# Patient Record
Sex: Female | Born: 1935 | Race: White | Hispanic: No | Marital: Married | Smoking: Never smoker
Health system: Southern US, Community
[De-identification: ages and names within clinical notes are randomized; demographics above are authoritative.]

## PROBLEM LIST (undated history)

## (undated) DIAGNOSIS — H409 Unspecified glaucoma: Secondary | ICD-10-CM

## (undated) DIAGNOSIS — C801 Malignant (primary) neoplasm, unspecified: Secondary | ICD-10-CM

## (undated) DIAGNOSIS — IMO0001 Reserved for inherently not codable concepts without codable children: Secondary | ICD-10-CM

## (undated) DIAGNOSIS — Z923 Personal history of irradiation: Secondary | ICD-10-CM

## (undated) DIAGNOSIS — IMO0002 Reserved for concepts with insufficient information to code with codable children: Secondary | ICD-10-CM

## (undated) HISTORY — DX: Reserved for concepts with insufficient information to code with codable children: IMO0002

## (undated) HISTORY — DX: Personal history of irradiation: Z92.3

## (undated) HISTORY — DX: Reserved for inherently not codable concepts without codable children: IMO0001

## (undated) HISTORY — PX: CHOLECYSTECTOMY: SHX55

## (undated) HISTORY — DX: Unspecified glaucoma: H40.9

## (undated) HISTORY — PX: APPENDECTOMY: SHX54

---

## 2004-06-12 ENCOUNTER — Emergency Department (HOSPITAL_COMMUNITY): Admission: EM | Admit: 2004-06-12 | Discharge: 2004-06-12 | Payer: Self-pay | Admitting: Family Medicine

## 2005-09-29 ENCOUNTER — Emergency Department (HOSPITAL_COMMUNITY): Admission: EM | Admit: 2005-09-29 | Discharge: 2005-09-29 | Payer: Self-pay | Admitting: Emergency Medicine

## 2006-12-12 ENCOUNTER — Inpatient Hospital Stay (HOSPITAL_COMMUNITY): Admission: EM | Admit: 2006-12-12 | Discharge: 2006-12-16 | Payer: Self-pay | Admitting: Emergency Medicine

## 2006-12-13 ENCOUNTER — Encounter (INDEPENDENT_AMBULATORY_CARE_PROVIDER_SITE_OTHER): Payer: Self-pay | Admitting: General Surgery

## 2010-06-25 NOTE — H&P (Signed)
Ebony Herring, CHAGNON                 ACCOUNT NO.:  [id number]   MEDICAL RECORD NO.:  [id number]          PATIENT TYPE:  EMS   LOCATION:  ED                           FACILITY:  [facility]   PHYSICIAN:  Adolph Pollack, M.D.DATE OF BIRTH:  06-05-1931   DATE OF ADMISSION:  12/12/2006  DATE OF DISCHARGE:                              HISTORY & PHYSICAL   CHIEF COMPLAINT:  Persistent right upper quadrant pain.   HISTORY OF PRESENT ILLNESS:  This 77 year old female has been having  intermittent bouts of self-limited right upper quadrant pressure-type  pains especially following a meal for about the past 5 weeks.  She awoke  this morning at 2 o'clock with persistent severe pain in the right upper  quadrant, pressure and sharp, that did not go away.  She eventually  presented to the emergency department at about 3:45 this afternoon and  was evaluated.  She was noted to have a leukocytosis and right upper  quadrant tenderness.  An ultrasound demonstrated a cholelithiasis with  gallbladder wall thickening consistent with acute cholecystitis and I  was asked to see her.  She denies fever, chills, nausea or vomiting.   PAST MEDICAL HISTORY:  Appendicitis.   PREVIOUS OPERATIONS:  Appendectomy.   ALLERGIES:  None.   MEDICATIONS:  None.   SOCIAL HISTORY:  She is married.  No tobacco or alcohol use.   PRIMARY CARE PHYSICIAN:  She does not have a primary care physician.   FAMILY HISTORY:  Notable for gallbladder disease.   REVIEW OF SYSTEMS:  CARDIOVASCULAR:  No heart disease or hypertension.  PULMONARY:  No pneumonia, emphysema or chronic lung disease.  GI:  No  hepatitis, diverticulitis or peptic ulcer disease.  GU:  No kidney  stones or urinary tract infections.  ENDOCRINE:  No diabetes, thyroid  disease or hypercholesterolemia.  NEUROLOGIC:  No strokes or seizures.  HEMATOLOGIC:  No bleeding disorder, blood transfusions or blood clots.   PHYSICAL EXAM:  GENERAL:  a well-developed,  well-nourished female who  appears to be in no acute distress, pleasant and cooperative.  VITAL SIGNS: Temperature is 98.5, blood pressure 123/75, pulse 73, O2  SATS 98% room air, and respiratory rate 18.  EYES:  Extraocular motions are intact.  No icterus.  NECK:  Supple without masses.  RESPIRATORY:  Breath sounds are equal and clear.  Respirations  unlabored.  CARDIOVASCULAR:  Regular rate.  Regular rhythm.  I do not hear a murmur.  ABDOMEN:  Soft with a right lower quadrant scar.  There is right upper  quadrant tenderness and guarding and a positive Murphy's sign.  SKIN:  No jaundice.  EXTREMITIES:  No edema.   LABORATORY DATA:  White count 12,000 with a hemoglobin of 13.6.  Liver  function tests and lipase are within normal limits.   Ultrasound demonstrates gallstones and gallbladder wall thickening.   IMPRESSION:  Acute cholecystitis.   PLAN:  Admit to the hospital and start IV antibiotics.   PLAN:  Laparoscopic or possible open cholecystectomy tomorrow.  I have  discussed the procedure, rationale and risks with her.  The risks  include, but not limited to bleeding, infection, wound-healing problems,  anesthesia, bile leak, accidental injury to the common bile  duct/liver/intestine and postcholecystectomy diarrhea.  Both she and her  husband seem to understand and agree to proceed.      Adolph Pollack, M.D.  Electronically Signed     TJR/MEDQ  D:  12/12/2006  T:  12/14/2006  Job:  [id number]

## 2010-06-25 NOTE — Op Note (Signed)
Ebony Herring, Ebony Herring                 ACCOUNT NO.:  [id number]   MEDICAL RECORD NO.:  [id number]          PATIENT TYPE:  INP   LOCATION:  1617                         FACILITY:  [facility]   PHYSICIAN:  Adolph Pollack, M.D.DATE OF BIRTH:  11/20/1931   DATE OF PROCEDURE:  12/13/2006  DATE OF DISCHARGE:                               OPERATIVE REPORT   PREOPERATIVE DIAGNOSIS:  Acute cholecystitis.   POSTOPERATIVE DIAGNOSIS:  Acute cholecystitis.   PROCEDURE:  Laparoscopic cholecystectomy with intraoperative  cholangiogram.   SURGEON:  Adolph Pollack, M.D.   ASSISTANT:  Velora Heckler, MD   ANESTHESIA:  General.   INDICATIONS:  This 77 year old female was admitted last night with acute  cholecystitis.  She has been given  intravenous hydration and  antibiotics and now presents for cholecystectomy.  The procedure and  risks were discussed with her preoperatively.   TECHNIQUE:  She is brought to the operating room, placed supine on the  operating table.  General anesthetic was administered.  The abdominal  wall was sterilely prepped, draped.  Marcaine was infiltrated in the  subumbilical region.  A small subumbilical incision was made through the  skin, subcutaneous tissue, fascia and peritoneum entering the peritoneal  cavity under direct vision.  A pursestring suture of 0-0 Vicryl was  placed around the fascial edges.  A Hassan trocar was introduced into  the peritoneal cavity.  Pneumoperitoneum created by insufflation of CO2  gas.   Next the laparoscope introduced.  She is then placed in reverse  Trendelenburg position right side tilted up.  An 11 mm trocar was placed  in the epigastric incision and two 5 mm trocars placed in right upper  quadrant.   Gallbladder was visualized, tense and acutely inflamed.  A needle  decompression was performed of the gallbladder and white bile was  evacuated.  Left lobe of the liver was flopping over the infundibular  area so I added an  extra 5 mm trocar in the epigastric region and  retracted this to the left.  The fundus of the gallbladder was grasped  and retracted to the right shoulder.  The infundibulum was grasped.  The  gallbladder was mainly intrahepatic, quite inflamed and indurated.  Using careful blunt dissection and hydrodissection I mobilized it into  the infundibulum.  I identified the cystic duct which appeared to be  dilated.  There appeared to be a stone close to the cystic duct common  bile duct junction.  I created a window around the cystic duct and very  carefully milked the stone back up toward the cystic duct gallbladder  junction.  The cystic duct was very friable and a little bit it tore in  the mid section.   A clip was placed on the gallbladder cystic duct junction in preparation  for cholangiogram.  Cholangiocatheter was then passed through the  anterior abdominal wall.  Small incision was made in the cystic duct.  The cholangiocath was placed into this and the duct was irrigated but it  was leaking out the tear in the midportion.  Thus I put the  catheter  through the small little tear in the midportion.  I then performed a  cholangiogram.   Under real time fluoroscopy dilute contrast was injected to the cystic  duct.  There was a short remaining length left.  The common hepatic and  common bile duct and right left hepatic ducts were visualized, contrast  drained into the duodenum without obvious evidence of obstruction.   The cholangiocath was then removed, I clipped the cystic duct three  times proximal to the small mid cystic duct tear and I divided it.  Before I had done this, I milked the stone out of the cystic duct.  I  then identified the anterior and posterior branch of the cystic artery.  Windows were created around these.  There were clipped and divided.  Then dissected the gallbladder free from liver.  Most of it intrahepatic  so there was a fair amount of oozing from liver bed.   Once the  gallbladder was free, I then placed it in an Endopouch bag.  I irrigated  out the gallbladder fossa and I controlled the bleeding with  electrocautery over the raw surface area.  I then noted no further  significant bleeding nor bile leak.  Surgicel was then placed in the  gallbladder fossa.  The irrigation fluid was then evacuated.   Then a size 19 Blake drain was then placed into the abdominal cavity and  positioned and in the gallbladder fossa.  It was brought out through the  lateral 5 mm port on the right side and anchored to the skin with 3-0  nylon suture.  The remaining trocars removed and pneumoperitoneum was  released.  The skin incisions were then closed with 4-0 Monocryl  subcuticular stitches followed by Steri-Strips and sterile dressings.   She tolerated the procedure without apparent complications and was taken  to recovery in satisfactory condition.      Adolph Pollack, M.D.  Electronically Signed     TJR/MEDQ  D:  12/13/2006  T:  12/14/2006  Job:  [id number]

## 2010-06-28 NOTE — Discharge Summary (Signed)
Ebony Herring, Ebony Herring                 ACCOUNT NO.:  [id number]   MEDICAL RECORD NO.:  [id number]          PATIENT TYPE:  INP   LOCATION:  1617                         FACILITY:  [facility]   PHYSICIAN:  Adolph Pollack, M.D.DATE OF BIRTH:  1931-11-01   DATE OF ADMISSION:  12/12/2006  DATE OF DISCHARGE:  12/16/2006                               DISCHARGE SUMMARY   FINAL DIAGNOSIS:  Acute cholecystitis.   SECONDARY DIAGNOSIS:  Mild postoperative ileus.   PROCEDURE:  Laparoscopic cholecystectomy with intraoperative  cholangiogram, December 13, 2006.   REASON FOR ADMISSION:  This is a 78 year old female who presented with  right upper quadrant pain that was persistent.  She went to the  emergency department and was noted have findings consistent with acute  cholecystitis and was admitted.   HOSPITAL COURSE:  She was started on intravenous antibiotics and taken  to the operating room on December 13, 2006, where she underwent the  laparoscopic cholecystectomy and tolerated this well.  She had some mild  postop ileus and was given a Dulcolax suppository and this helped the  ileus resolve.  She was tolerating diet, passing gas, walking and by  postop day #3, was ready to go home.   DISPOSITION:  Discharged to home on December 16, 2006 in satisfactory  condition.   DISCHARGE INSTRUCTIONS:  She was given typewritten home care  instructions as well as Vicodin for pain.   FOLLOWUP:  She did have a drain in which was just draining some small  amount of serosanguineous material, nonbilious.  She will come back to  the office Monday 11/10 to have the drain removed.      Adolph Pollack, M.D.  Electronically Signed     TJR/MEDQ  D:  01/14/2007  T:  01/14/2007  Job:  [id number]

## 2010-11-19 LAB — URINALYSIS, ROUTINE W REFLEX MICROSCOPIC
Glucose, UA: NEGATIVE
Urobilinogen, UA: 0.2
pH: 5.5

## 2010-11-19 LAB — CBC
HCT: 39.8
Hemoglobin: 13.6
MCHC: 34.1
RBC: 4.45
RDW: 13.4
WBC: 12 — ABNORMAL HIGH

## 2010-11-19 LAB — COMPREHENSIVE METABOLIC PANEL
ALT: 32
Albumin: 3.7
Alkaline Phosphatase: 75
GFR calc non Af Amer: >60
Glucose, Bld: 119 — ABNORMAL HIGH
Sodium: 142
Total Protein: 6.8

## 2010-11-19 LAB — DIFFERENTIAL
Basophils Absolute: 0.1
Eosinophils Absolute: 0.1
Eosinophils Relative: 0
Lymphocytes Relative: 24
Monocytes Absolute: 0.9 — ABNORMAL HIGH
Neutrophils Relative %: 67

## 2010-11-19 LAB — LIPASE, BLOOD: Lipase: 23

## 2011-04-10 ENCOUNTER — Other Ambulatory Visit: Payer: Self-pay

## 2011-04-10 ENCOUNTER — Emergency Department (HOSPITAL_COMMUNITY): Payer: Medicare Other

## 2011-04-10 ENCOUNTER — Emergency Department (HOSPITAL_COMMUNITY)
Admission: EM | Admit: 2011-04-10 | Discharge: 2011-04-10 | Disposition: A | Discharge status: Home / Self Care | Payer: Medicare Other | Attending: Emergency Medicine | Admitting: Emergency Medicine

## 2011-04-10 ENCOUNTER — Encounter (HOSPITAL_COMMUNITY): Payer: Self-pay | Admitting: Emergency Medicine

## 2011-04-10 DIAGNOSIS — R21 Rash and other nonspecific skin eruption: Secondary | ICD-10-CM | POA: Insufficient documentation

## 2011-04-10 DIAGNOSIS — Z7982 Long term (current) use of aspirin: Secondary | ICD-10-CM | POA: Insufficient documentation

## 2011-04-10 DIAGNOSIS — M549 Dorsalgia, unspecified: Secondary | ICD-10-CM

## 2011-04-10 MED ORDER — TRAMADOL HCL 50 MG PO TABS: 50.0000 mg | ORAL_TABLET | Freq: Four times a day (QID) | ORAL | Status: AC | PRN

## 2011-04-10 NOTE — ED Provider Notes (Signed)
History     CSN: [id number]  Arrival date & time 04/10/11  0950   First MD Initiated Contact with Patient 04/10/11 1136      Chief Complaint  Patient presents with  . Shoulder Pain    Burning Pain in "skin under both shoulder blades" x 48 hrs. No rash noted    (Consider location/radiation/quality/duration/timing/severity/associated sxs/prior treatment) HPI  A generally healthy 78 year old female presenting to the ED with chief complaints of upper back discomfort. Patient states for the past 2 days she has been experiencing an itching and burning sensation to her mid back. Sensation is on the skin and below both of her shoulder blades. Itching burning sensation is with the same intensity. Nothing makes it better or worse. She denies chest pain, shortness shortness of breath, throat swelling, trouble breathing, nausea, vomiting, diarrhea, or abdominal pain. She denies history of shingles. She denies any changes in her detergent or any new pets. She denies any medication changes. She denies fever, headache, or rash.  History reviewed. No pertinent past medical history.  Past Surgical History  Procedure Date  . Cholecystectomy   . Appendectomy     History reviewed. No pertinent family history.  History  Substance Use Topics  . Smoking status: Never Smoker   . Smokeless tobacco: Not on file  . Alcohol Use: No    OB History    Grav Para Term Preterm Abortions TAB SAB Ect Mult Living                  Review of Systems  All other systems reviewed and are negative.    Allergies  Review of patient's allergies indicates no known allergies.  Home Medications   Current Outpatient Rx  Name Route Sig Dispense Refill  . ASPIRIN 325 MG PO TABS Oral Take 325 mg by mouth daily.      BP 153/89  Pulse 64  Temp 98.3 F (36.8 C)  Resp 18  Wt 139 lb (63.05 kg)  SpO2 99%  Physical Exam  Nursing note and vitals reviewed. Constitutional: She appears well-developed and  well-nourished. No distress.       Awake, alert, nontoxic appearance  HENT:  Head: Atraumatic.  Eyes: Conjunctivae are normal. Right eye exhibits no discharge. Left eye exhibits no discharge.  Neck: Neck supple.  Cardiovascular: Normal rate and regular rhythm.   Pulmonary/Chest: Effort normal. No respiratory distress. She exhibits no tenderness.  Abdominal: Soft. There is no tenderness. There is no rebound.  Musculoskeletal: She exhibits no tenderness.       ROM appears intact, no obvious focal weakness  Neurological:       Mental status and motor strength appears intact  Skin: No rash noted.     Psychiatric: She has a normal mood and affect.    ED Course  Procedures (including critical care time)  Labs Reviewed - No data to display No results found.   No diagnosis found.   Date: 04/10/2011  Rate: 58  Rhythm: normal sinus rhythm  QRS Axis: normal  Intervals: normal  ST/T Wave abnormalities: normal  Conduction Disutrbances:none  Narrative Interpretation:   Old EKG Reviewed: none available  Dg Chest 2 View  04/10/2011  *RADIOLOGY REPORT*  Clinical Data: Pain  CHEST - 2 VIEW  Comparison: December 12, 2006  Findings: The cardiac silhouette, mediastinum, pulmonary vasculature are within normal limits.  Both lungs are clear. There is no acute bony abnormality.  IMPRESSION: There is no evidence of acute cardiac or pulmonary  process.  Original Report Authenticated By: Brandon Melnick, M.D.      MDM  Sxs suggestive of shingle prior to rash outbreak.  Due to her age, ECG and cxr obtain.  Discussed with my attending.     1:01 PM Xray reviewed by me and it was negative.  IN the setting of an unremarkable exam, normal ECG, and normal CXR, i recommend for pt to keep an eye out for possible rash.  If rash appears, to f/u with PCP for antiviral treatment. My attending has seen and evaluate the pt.        Fayrene Helper, PA-C 04/10/11 1320

## 2011-04-10 NOTE — ED Notes (Signed)
Pain in upper back. Concerned about itching, burning

## 2011-04-10 NOTE — ED Provider Notes (Signed)
Patient relates over the weekend she was cleaning house and was moving furniture around. She relates to 3 days ago she started getting pain in her back between her shoulder blades it hurts when she moves her arms. She also states it's a burning discomfort she also has itching.  Issues back examined there is no rash seen she is noted to have a lot of seborrheic keratosis but no rash c/w shingles, she is tender in the area between her shoulder blades and has pain on ROM of her arms.  Medical screening examination/treatment/procedure(s) were conducted as a shared visit with non-physician practitioner(s) and myself.  I personally evaluated the patient during the encounter Devoria Albe, MD, Franz Dell, MD 04/10/11 1330

## 2011-04-10 NOTE — ED Provider Notes (Signed)
See prior note   Ward Givens, MD 04/10/11 1701

## 2012-09-29 ENCOUNTER — Ambulatory Visit (INDEPENDENT_AMBULATORY_CARE_PROVIDER_SITE_OTHER): Payer: Medicare Other | Admitting: Family Medicine

## 2012-09-29 ENCOUNTER — Encounter: Payer: Self-pay | Admitting: Family Medicine

## 2012-09-29 VITALS — BP 140/78 | HR 66 | Temp 96.9°F | Ht 62.0 in | Wt 138.0 lb

## 2012-09-29 DIAGNOSIS — H6123 Impacted cerumen, bilateral: Secondary | ICD-10-CM

## 2012-09-29 DIAGNOSIS — R030 Elevated blood-pressure reading, without diagnosis of hypertension: Secondary | ICD-10-CM

## 2012-09-29 DIAGNOSIS — J019 Acute sinusitis, unspecified: Secondary | ICD-10-CM

## 2012-09-29 DIAGNOSIS — H6120 Impacted cerumen, unspecified ear: Secondary | ICD-10-CM | POA: Insufficient documentation

## 2012-09-29 DIAGNOSIS — IMO0001 Reserved for inherently not codable concepts without codable children: Secondary | ICD-10-CM

## 2012-09-29 MED ORDER — PENICILLIN V POTASSIUM 500 MG PO TABS: 500.0000 mg | ORAL_TABLET | Freq: Three times a day (TID) | ORAL | Status: DC

## 2012-09-29 NOTE — Assessment & Plan Note (Signed)
Ears flushed at bedside

## 2012-09-29 NOTE — Assessment & Plan Note (Signed)
Antibiotics Nasal saline Mucinex

## 2012-09-29 NOTE — Patient Instructions (Addendum)
Take antibiotics as prescribed Pick up Mucinex , no decongestants because of your blood pressure Plenty of fluids Nasal saline over the counter FU 4 weeks for repeat blood pressure

## 2012-09-29 NOTE — Progress Notes (Signed)
  Subjective:    Patient ID: Ebony Herring, female    DOB: April 09, 1931, 79 y.o.   MRN: [id number]  HPI Pt here to re-estabish care, Has not been seen in 5 years. She did have a cholecystectomy back in 2008. She is followed by her eye doctor. She states she's not sick she will not come in. She declines any immunizations. She's here today due to worsening sinus infection.started about 10 days ago, took tylenol for headache which helped some. Drainage and pressure in both sides or nose. Also has dripping down throat. Noticed ear pressure and pain, can not hear well out of either ear. No cough. No fever History reviewed  Eye doctor- Dr. Salvatore Marvel - [facility], has increased pressures in Eye but no diagnosis Glucoma  Review of Systems   GEN- denies fatigue, fever, weight loss,weakness, recent illness HEENT- denies eye drainage, change in vision,+ nasal discharge, CVS- denies chest pain, palpitations RESP- denies SOB, cough, wheeze ABD- denies N/V, change in stools, abd pain GU- denies dysuria, hematuria, dribbling, incontinence MSK- denies joint pain, muscle aches, injury Neuro- denies headache, dizziness, syncope, seizure activity      Objective:   Physical Exam  GEN- NAD, alert and oriented x3 HEENT- PERRL, EOMI, non injected sclera, pink conjunctiva, MMM, oropharynx clear ,Bilateral cerumen impaction, no erythema of canals + maxillary sinus tenderness, inflammed turbinates,  Clear Nasal drainage  Neck- Supple, no LAD CVS- RRR, no murmur RESP-CTAB EXT- No edema Pulses- Radial 2+  S/p irrigation, mild erythema right canal some hard wax on edges, TM clear no effusion, left canal clear TM clear no effusion  hearing improved and pain        Assessment & Plan:

## 2012-09-30 ENCOUNTER — Encounter: Payer: Self-pay | Admitting: Family Medicine

## 2012-09-30 NOTE — Assessment & Plan Note (Signed)
Repeat BP improved , will recheck in 4 weeks, when not in acute illness, no meds given today

## 2012-10-27 ENCOUNTER — Encounter: Payer: Self-pay | Admitting: Family Medicine

## 2012-10-27 ENCOUNTER — Ambulatory Visit (INDEPENDENT_AMBULATORY_CARE_PROVIDER_SITE_OTHER): Payer: Medicare Other | Admitting: Family Medicine

## 2012-10-27 VITALS — BP 138/70 | HR 60 | Temp 96.6°F | Resp 18 | Wt 138.0 lb

## 2012-10-27 DIAGNOSIS — N760 Acute vaginitis: Secondary | ICD-10-CM

## 2012-10-27 DIAGNOSIS — R030 Elevated blood-pressure reading, without diagnosis of hypertension: Secondary | ICD-10-CM

## 2012-10-27 DIAGNOSIS — IMO0001 Reserved for inherently not codable concepts without codable children: Secondary | ICD-10-CM

## 2012-10-27 DIAGNOSIS — R300 Dysuria: Secondary | ICD-10-CM

## 2012-10-27 LAB — LIPID PANEL
Cholesterol: 274 mg/dL — ABNORMAL HIGH (ref 0–200)
HDL: 57 mg/dL (ref >39)
LDL Cholesterol: 167 mg/dL — ABNORMAL HIGH (ref 0–99)
Total CHOL/HDL Ratio: 4.8 Ratio
Triglycerides: 252 mg/dL — ABNORMAL HIGH (ref <150)
VLDL: 50 mg/dL — ABNORMAL HIGH (ref 0–40)

## 2012-10-27 LAB — COMPREHENSIVE METABOLIC PANEL
AST: 20 U/L (ref 0–37)
Calcium: 9.9 mg/dL (ref 8.4–10.5)
Chloride: 103 mEq/L (ref 96–112)
Creat: 0.94 mg/dL (ref 0.50–1.10)
Total Protein: 6.9 g/dL (ref 6.0–8.3)

## 2012-10-27 LAB — CBC WITH DIFFERENTIAL/PLATELET
Basophils Relative: 1 % (ref 0–1)
Eosinophils Absolute: 0.2 10*3/uL (ref 0.0–0.7)
Eosinophils Relative: 3 % (ref 0–5)
HCT: 40.8 % (ref 36.0–46.0)
Hemoglobin: 13.7 g/dL (ref 12.0–15.0)
Lymphocytes Relative: 37 % (ref 12–46)
Monocytes Relative: 9 % (ref 3–12)
RBC: 4.62 MIL/uL (ref 3.87–5.11)
RDW: 14.2 % (ref 11.5–15.5)

## 2012-10-27 LAB — URINALYSIS, ROUTINE W REFLEX MICROSCOPIC
Glucose, UA: NEGATIVE mg/dL
Nitrite: NEGATIVE

## 2012-10-27 MED ORDER — FLUCONAZOLE 150 MG PO TABS: 150.0000 mg | ORAL_TABLET | Freq: Once | ORAL | Status: DC

## 2012-10-27 NOTE — Patient Instructions (Addendum)
Keep and eye on BP at pharmacy, come back if BP > 150/90 We will call with lab results Take the diflucan for the discharge  F/U as needed, recommend yearly visit

## 2012-10-27 NOTE — Progress Notes (Signed)
  Subjective:    Patient ID: Ebony Herring, female    DOB: 04/27/1931, 79 y.o.   MRN: [id number]  HPI  Pt here to f/u elevated BP, was seen 4 weeks ago, secondary to sinusitis, BP was elevated initially 160/100. Completed antibiotics and sinus symptoms have resolved. She did notice over past 2 weeks, urinary frequency and burning with urination. It has improved but she had a few days with white vaginal discharge. Denies vaginal pruritis or odor, denies vaginal bleeding.  Review of Systems   GEN- denies fatigue, fever, weight loss,weakness, recent illness HEENT- denies eye drainage, change in vision, nasal discharge, CVS- denies chest pain, palpitations RESP- denies SOB, cough, wheeze ABD- denies N/V, change in stools, abd pain GU- + dysuria, hematuria, dribbling, incontinence Neuro- denies headache, dizziness, syncope, seizure activity      Objective:   Physical Exam GEN- NAD, alert and oriented x3 HEENT- PERRL, EOMI, non injected sclera, pink conjunctiva, MMM, oropharynx clear Neck- Supple, no LAD CVS- RRR, no murmur RESP-CTAB ABD-NABS,soft, NT,ND, no CVA tenderness EXT- No edema Pulses- Radial 2+        Assessment & Plan:

## 2012-10-28 DIAGNOSIS — N760 Acute vaginitis: Secondary | ICD-10-CM | POA: Insufficient documentation

## 2012-10-28 DIAGNOSIS — R300 Dysuria: Secondary | ICD-10-CM | POA: Insufficient documentation

## 2012-10-28 NOTE — Assessment & Plan Note (Signed)
Will treat empirically for yeast with diflucan with recent course of antibiotics

## 2012-10-28 NOTE — Assessment & Plan Note (Signed)
Much improved today, we will monitor, pt did agree to get labs done today, has not been seen > 5 years

## 2012-10-28 NOTE — Assessment & Plan Note (Signed)
Symptoms have improved, will await urine culture before giving any further antibiotics

## 2012-10-29 LAB — URINALYSIS, MICROSCOPIC ONLY: Crystals: NONE SEEN

## 2012-11-30 ENCOUNTER — Ambulatory Visit (INDEPENDENT_AMBULATORY_CARE_PROVIDER_SITE_OTHER): Payer: Medicare Other | Admitting: Family Medicine

## 2012-11-30 ENCOUNTER — Encounter: Payer: Self-pay | Admitting: Family Medicine

## 2012-11-30 VITALS — BP 100/60 | HR 80 | Temp 97.3°F | Resp 20 | Wt 139.5 lb

## 2012-11-30 DIAGNOSIS — I493 Ventricular premature depolarization: Secondary | ICD-10-CM

## 2012-11-30 DIAGNOSIS — I4949 Other premature depolarization: Secondary | ICD-10-CM

## 2012-11-30 DIAGNOSIS — N898 Other specified noninflammatory disorders of vagina: Secondary | ICD-10-CM

## 2012-11-30 DIAGNOSIS — N760 Acute vaginitis: Secondary | ICD-10-CM

## 2012-11-30 DIAGNOSIS — N390 Urinary tract infection, site not specified: Secondary | ICD-10-CM

## 2012-11-30 LAB — URINALYSIS, ROUTINE W REFLEX MICROSCOPIC
Ketones, ur: NEGATIVE mg/dL
Nitrite: NEGATIVE
Protein, ur: NEGATIVE mg/dL
Urobilinogen, UA: 0.2 mg/dL (ref 0.0–1.0)

## 2012-11-30 LAB — URINALYSIS, MICROSCOPIC ONLY

## 2012-11-30 MED ORDER — FLUCONAZOLE 150 MG PO TABS: 150.0000 mg | ORAL_TABLET | Freq: Once | ORAL | Status: DC

## 2012-11-30 MED ORDER — CEPHALEXIN 500 MG PO CAPS: 500.0000 mg | ORAL_CAPSULE | Freq: Two times a day (BID) | ORAL | Status: DC

## 2012-11-30 NOTE — Progress Notes (Signed)
  Subjective:    Patient ID: Ebony Herring, female    DOB: 12/21/1931, 80 y.o.   MRN: [id number]  HPI  Patient here with urinary frequency and leaking for the past week. She's also had some vaginal discharge associated. On Friday she had spotting on 2 occasions therefore this may her concern. She denies any abdominal pain or fever. At our last visit she was treated for vaginitis with Diflucan she had been on a course of penicillin and all for symptoms resolved at that time. She had some mild dysuria around that time as well but the culture was negative. She now returns with similar symptoms.  Review of Systems - per above  GEN- denies fatigue, fever, weight loss,weakness, recent illness HEENT- denies eye drainage, change in vision, nasal discharge, CVS- denies chest pain, palpitations RESP- denies SOB, cough, wheeze ABD- denies N/V, change in stools, abd pain GU- denies dysuria, hematuria, dribbling,+ incontinence MSK- denies joint pain, muscle aches, injury Neuro- denies headache, dizziness, syncope, seizure activity       Objective:   Physical Exam  GEN- NAD, alert and oriented x 3 CVS- Regular rate, irregular rhythms, no murmur RESP-CTAB ABD-NABS,soft,NT,ND GU- normal external genitalia, vaginal mucosa pink/vaginal atrophy, cervix visualized no growth, no blood form os, minimal thin clear discharge, no CMT, no ovarian masses, uterus palpated no mass felt, mild bladder prolapse Ext- no edema Pulse 2+       Assessment & Plan:

## 2012-11-30 NOTE — Assessment & Plan Note (Signed)
Asymptomatic PVCs noted nothing needs to be done at this time.

## 2012-11-30 NOTE — Assessment & Plan Note (Signed)
Overall unremarkable. No bleeding seen on exam today. We'll treat the urine per above

## 2012-11-30 NOTE — Assessment & Plan Note (Signed)
Will treat with Keflex twice a day will have her followup with the Diflucan at the end of antibiotic If her symptoms do not improve we may need urology involvement for her leaking

## 2012-11-30 NOTE — Patient Instructions (Addendum)
Take the antibiotics as prescribed Drink fluids F/U as needed or if symptoms do not improve

## 2012-12-20 ENCOUNTER — Encounter: Payer: Self-pay | Admitting: Family Medicine

## 2012-12-31 ENCOUNTER — Ambulatory Visit (INDEPENDENT_AMBULATORY_CARE_PROVIDER_SITE_OTHER): Payer: Medicare Other | Admitting: Family Medicine

## 2012-12-31 VITALS — BP 112/70 | HR 82 | Temp 99.2°F | Resp 20 | Ht 62.0 in | Wt 139.5 lb

## 2012-12-31 DIAGNOSIS — N950 Postmenopausal bleeding: Secondary | ICD-10-CM

## 2012-12-31 DIAGNOSIS — E785 Hyperlipidemia, unspecified: Secondary | ICD-10-CM

## 2012-12-31 LAB — URINALYSIS, MICROSCOPIC ONLY
Casts: NONE SEEN
Crystals: NONE SEEN

## 2012-12-31 LAB — URINALYSIS, ROUTINE W REFLEX MICROSCOPIC
Bilirubin Urine: NEGATIVE
Ketones, ur: NEGATIVE mg/dL
Nitrite: NEGATIVE
Protein, ur: NEGATIVE mg/dL
Urobilinogen, UA: 0.2 mg/dL (ref 0.0–1.0)

## 2012-12-31 NOTE — Patient Instructions (Signed)
Come in for fasting labs in 4 weeks- After Dec 17th Referral to GYN F/U as needed

## 2013-01-01 ENCOUNTER — Encounter: Payer: Self-pay | Admitting: Family Medicine

## 2013-01-01 DIAGNOSIS — E785 Hyperlipidemia, unspecified: Secondary | ICD-10-CM | POA: Insufficient documentation

## 2013-01-01 DIAGNOSIS — N950 Postmenopausal bleeding: Secondary | ICD-10-CM | POA: Insufficient documentation

## 2013-01-01 LAB — URINE CULTURE: Colony Count: NO GROWTH

## 2013-01-01 NOTE — Assessment & Plan Note (Signed)
Recheck FLP, on Fish oil, watching diet

## 2013-01-01 NOTE — Assessment & Plan Note (Signed)
Not requiring a GYN workup in many years. Secondary to the recurrent episode of spotting I will send her to GYN for evaluation and pelvic exam as well as Pap smear and ultrasound. She agrees with the plan to proceed A urine culture was sent

## 2013-01-01 NOTE — Progress Notes (Signed)
  Subjective:    Patient ID: Ebony Herring, female    DOB: 13-Nov-1931, 79 y.o.   MRN: [id number]  HPI Patient here with vaginal spotting for the past week. She was treated for UTI as well as infection I last visit. She was doing well until she notices spotting. She denies any pain or discomfort. She has not had any vaginal discharge. She's not had any dysuria.   Review of Systems  GEN- denies fatigue, fever, weight loss,weakness, recent illness HEENT- denies eye drainage, change in vision, nasal discharge, CVS- denies chest pain, palpitations RESP- denies SOB, cough, wheeze ABD- denies N/V, change in stools, abd pain GU- denies dysuria, hematuria, dribbling, incontinence MSK- denies joint pain, muscle aches, injury Neuro- denies headache, dizziness, syncope, seizure activity      Objective:   Physical Exam GEN-NAD,alert and oriented x 3, well appearing CVS-RRR, no murmur RESP-CTAB ABD-NABS,soft,NT,ND,no CVA tenderness Ext- No edema Pulse- Radial 2+       Assessment & Plan:

## 2013-01-14 ENCOUNTER — Other Ambulatory Visit (HOSPITAL_COMMUNITY): Payer: Self-pay | Admitting: Obstetrics and Gynecology

## 2013-01-14 ENCOUNTER — Other Ambulatory Visit: Payer: Self-pay | Admitting: Obstetrics and Gynecology

## 2013-01-14 DIAGNOSIS — N950 Postmenopausal bleeding: Secondary | ICD-10-CM

## 2013-01-14 DIAGNOSIS — N83201 Unspecified ovarian cyst, right side: Secondary | ICD-10-CM

## 2013-01-20 ENCOUNTER — Ambulatory Visit (HOSPITAL_COMMUNITY)
Admission: RE | Admit: 2013-01-20 | Discharge: 2013-01-20 | Disposition: A | Discharge status: Home / Self Care | Payer: Medicare Other | Source: Ambulatory Visit | Attending: Obstetrics and Gynecology | Admitting: Obstetrics and Gynecology

## 2013-01-20 DIAGNOSIS — N9489 Other specified conditions associated with female genital organs and menstrual cycle: Secondary | ICD-10-CM | POA: Insufficient documentation

## 2013-01-20 DIAGNOSIS — N83201 Unspecified ovarian cyst, right side: Secondary | ICD-10-CM

## 2013-01-20 DIAGNOSIS — D259 Leiomyoma of uterus, unspecified: Secondary | ICD-10-CM | POA: Insufficient documentation

## 2013-01-20 LAB — CREATININE, SERUM: Creatinine, Ser: 0.86 mg/dL (ref 0.50–1.10)

## 2013-01-20 MED ORDER — GADOBENATE DIMEGLUMINE 529 MG/ML IV SOLN
12.0000 mL | Freq: Once | INTRAVENOUS | Status: AC | PRN
  Administered 2013-01-20: 12 mL via INTRAVENOUS

## 2013-01-26 ENCOUNTER — Other Ambulatory Visit: Payer: Self-pay | Admitting: Obstetrics and Gynecology

## 2013-01-26 ENCOUNTER — Encounter: Payer: Self-pay | Admitting: Family Medicine

## 2013-02-11 ENCOUNTER — Encounter (HOSPITAL_COMMUNITY): Admission: RE | Payer: Self-pay | Source: Ambulatory Visit

## 2013-02-11 ENCOUNTER — Ambulatory Visit (HOSPITAL_COMMUNITY)
Admission: RE | Admit: 2013-02-11 | Payer: Medicare Other | Source: Ambulatory Visit | Admitting: Obstetrics and Gynecology

## 2013-02-11 SURGERY — DILATATION & CURETTAGE/HYSTEROSCOPY WITH TRUCLEAR
Anesthesia: Choice

## 2013-02-18 ENCOUNTER — Ambulatory Visit: Payer: Medicare Other | Attending: Gynecology | Admitting: Gynecology

## 2013-02-18 ENCOUNTER — Encounter: Payer: Self-pay | Admitting: Gynecology

## 2013-02-18 VITALS — BP 149/79 | HR 72 | Temp 98.3°F | Resp 16 | Ht 62.44 in | Wt 136.0 lb

## 2013-02-18 DIAGNOSIS — D259 Leiomyoma of uterus, unspecified: Secondary | ICD-10-CM | POA: Insufficient documentation

## 2013-02-18 DIAGNOSIS — C549 Malignant neoplasm of corpus uteri, unspecified: Secondary | ICD-10-CM | POA: Insufficient documentation

## 2013-02-18 DIAGNOSIS — C541 Malignant neoplasm of endometrium: Secondary | ICD-10-CM

## 2013-02-18 NOTE — Patient Instructions (Signed)
Plan for having a total abdominal hysterectomy, bilateral salpingo-oophorectomy, and lymph node dissection with Dr. Fermin Schwab on Jan 20.  Please give yourself a fleets enema the evening before surgery, Monday Jan 19.  You will receive a phone call from pre-surgical testing at [facility] to arrange for a pre-operative appointment before surgery.  Please call for any questions or concerns.

## 2013-02-18 NOTE — Progress Notes (Signed)
Consult Note: Gyn-Onc   Ebony Herring 80 y.o. female  No chief complaint on file.   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 Uterine fibroids.  Plan: Total abdominal hysterectomy and bilateral salpingo-oophorectomy, pelvic and para-aortic lymphadenectomy which is scheduled for 03/01/2013.  Natural history of disease as well as the possibility of postoperative adjuvant therapy were reviewed with the patient and her husband. Risks of surgery were also outlined all questions are answered.   HPI: 80 year old white married female seen in consultation at the request of Dr. Gaetano Net regarding the management of a newly diagnosed high-grade endometrial carcinoma. The patient's history dates back to approximately November when she developed of vaginal discharge. Overtime some blood was noted in the discharge. Ultimately the patient was referred to Dr. Gertie Fey for further evaluation. An endometrial biopsy was obtained on 01/26/2013 showed a high-grade endometrial carcinoma with serous features. Subsequently the patient's had an MRI of the pelvis showing a fibroid uterus and prominent bilateral iliac lymph nodes measuring between 1 cm and 7 mm. Currently the patient is having minimal bleeding and no significant pain.  Past gynecologic history includes a bilateral tubal ligation. Obstetrical history gravida 2.    Review of Systems:10 point review of systems is negative except as noted in interval history.   Vitals: Blood pressure 149/79, pulse 72, temperature 98.3 F (36.8 C), resp. rate 16, height 5' 2.44" (1.586 m), weight 136 lb (61.689 kg).  Physical Exam: General : The patient is a healthy woman in no acute distress.  HEENT: normocephalic, extraoccular movements normal; neck is supple without thyromegally  Lynphnodes: Supraclavicular and inguinal nodes not enlarged  Abdomen: Soft, non-tender, no ascites, no organomegally, no masses, no hernias she has a well-healed  scar at McBurney's point and small incisions from her tubal ligation and laparoscopic cholecystectomy Pelvic:  EGBUS: Normal female  Vagina: Normal, no lesions  Urethra and Bladder: Normal, non-tender  Cervix: Normal Uterus: Irregular in shape consistent with uterine fibroids. Bi-manual examination: Non-tender; no adenxal masses or nodularity  Rectal: normal sphincter tone, no masses, no blood  Lower extremities: No edema or varicosities. Normal range of motion      Allergies  Allergen Reactions  . Bactrim [Sulfamethoxazole-Trimethoprim]   . Ciprofloxacin     Unknown if this or bactrim caused allergic reaction    Past Medical History  Diagnosis Date  . Glaucoma     High pressure- ?pre glaucoma    Past Surgical History  Procedure Laterality Date  . Cholecystectomy    . Appendectomy      Current Outpatient Prescriptions  Medication Sig Dispense Refill  . Brinzolamide-Brimonidine (SIMBRINZA) 1-0.2 % SUSP Apply 1 drop to eye 2 (two) times daily.      . fish oil-omega-3 fatty acids 1000 MG capsule Take 2 g by mouth daily.       No current facility-administered medications for this visit.    History   Social History  . Marital Status: Married    Spouse Name: N/A    Number of Children: N/A  . Years of Education: N/A   Occupational History  . Not on file.   Social History Main Topics  . Smoking status: Never Smoker   . Smokeless tobacco: Never Used  . Alcohol Use: No  . Drug Use: No  . Sexual Activity: Not on file   Other Topics Concern  . Not on file   Social History Narrative  . No narrative on file    History reviewed. No pertinent  family history.    [name],Juan Olthoff L, MD 02/18/2013, 11:18 AM        

## 2013-02-25 ENCOUNTER — Encounter (HOSPITAL_COMMUNITY): Payer: Self-pay | Admitting: Pharmacy Technician

## 2013-02-25 ENCOUNTER — Encounter (HOSPITAL_COMMUNITY): Payer: Self-pay

## 2013-02-25 ENCOUNTER — Encounter (HOSPITAL_COMMUNITY)
Admission: RE | Admit: 2013-02-25 | Discharge: 2013-02-25 | Disposition: A | Discharge status: Home / Self Care | Payer: Medicare Other | Source: Ambulatory Visit | Attending: Gynecology | Admitting: Gynecology

## 2013-02-25 ENCOUNTER — Ambulatory Visit (HOSPITAL_COMMUNITY)
Admission: RE | Admit: 2013-02-25 | Discharge: 2013-02-25 | Disposition: A | Discharge status: Home / Self Care | Payer: Medicare Other | Source: Ambulatory Visit | Attending: Gynecology | Admitting: Gynecology

## 2013-02-25 DIAGNOSIS — C549 Malignant neoplasm of corpus uteri, unspecified: Secondary | ICD-10-CM | POA: Insufficient documentation

## 2013-02-25 DIAGNOSIS — K449 Diaphragmatic hernia without obstruction or gangrene: Secondary | ICD-10-CM | POA: Insufficient documentation

## 2013-02-25 HISTORY — DX: Malignant (primary) neoplasm, unspecified: C80.1

## 2013-02-25 LAB — COMPREHENSIVE METABOLIC PANEL
ALK PHOS: 83 U/L (ref 39–117)
ALT: 11 U/L (ref 0–35)
AST: 19 U/L (ref 0–37)
Albumin: 4 g/dL (ref 3.5–5.2)
BUN: 16 mg/dL (ref 6–23)
CO2: 26 meq/L (ref 19–32)
Calcium: 9.5 mg/dL (ref 8.4–10.5)
Chloride: 103 mEq/L (ref 96–112)
Creatinine, Ser: 0.84 mg/dL (ref 0.50–1.10)
GFR calc non Af Amer: 64 mL/min — ABNORMAL LOW (ref >90)
GFR, EST AFRICAN AMERICAN: 75 mL/min — AB (ref >90)
GLUCOSE: 99 mg/dL (ref 70–99)
Potassium: 4.8 mEq/L (ref 3.7–5.3)
SODIUM: 142 meq/L (ref 137–147)
Total Bilirubin: 0.3 mg/dL (ref 0.3–1.2)
Total Protein: 7.1 g/dL (ref 6.0–8.3)

## 2013-02-25 LAB — URINALYSIS, ROUTINE W REFLEX MICROSCOPIC
GLUCOSE, UA: NEGATIVE mg/dL
KETONES UR: NEGATIVE mg/dL
Nitrite: NEGATIVE
PH: 5 (ref 5.0–8.0)
Protein, ur: 30 mg/dL — AB
Specific Gravity, Urine: 1.036 — ABNORMAL HIGH (ref 1.005–1.030)
Urobilinogen, UA: 0.2 mg/dL (ref 0.0–1.0)

## 2013-02-25 LAB — CBC WITH DIFFERENTIAL/PLATELET
Basophils Absolute: 0 10*3/uL (ref 0.0–0.1)
Basophils Relative: 0 % (ref 0–1)
EOS PCT: 2 % (ref 0–5)
Eosinophils Absolute: 0.1 10*3/uL (ref 0.0–0.7)
HEMATOCRIT: 41.2 % (ref 36.0–46.0)
Hemoglobin: 13.3 g/dL (ref 12.0–15.0)
LYMPHS ABS: 2.6 10*3/uL (ref 0.7–4.0)
LYMPHS PCT: 32 % (ref 12–46)
MCH: 29.8 pg (ref 26.0–34.0)
MCHC: 32.3 g/dL (ref 30.0–36.0)
MCV: 92.4 fL (ref 78.0–100.0)
MONO ABS: 0.5 10*3/uL (ref 0.1–1.0)
MONOS PCT: 7 % (ref 3–12)
Neutro Abs: 4.8 10*3/uL (ref 1.7–7.7)
Neutrophils Relative %: 60 % (ref 43–77)
PLATELETS: 326 10*3/uL (ref 150–400)
RBC: 4.46 MIL/uL (ref 3.87–5.11)
RDW: 12.9 % (ref 11.5–15.5)
WBC: 8.1 10*3/uL (ref 4.0–10.5)

## 2013-02-25 LAB — ABO/RH: ABO/RH(D): A POS

## 2013-02-25 LAB — URINE MICROSCOPIC-ADD ON

## 2013-02-25 NOTE — Progress Notes (Signed)
Abnormal UA faxed to [facility] NP

## 2013-02-25 NOTE — Patient Instructions (Addendum)
Ebony Herring  02/25/2013                           YOUR PROCEDURE IS SCHEDULED ON: 03/01/13               PLEASE REPORT TO SHORT STAY CENTER AT : 5:30 AM               CALL THIS NUMBER IF ANY PROBLEMS THE DAY OF SURGERY :               [phone]                      REMEMBER:   Do not eat food or drink liquids AFTER MIDNIGHT   Take these medicines the morning of surgery with A SIP OF WATER: USE EYE DROPS   Do not wear jewelry, make-up   Do not wear lotions, powders, or perfumes.   Do not shave legs or underarms 12 hrs. before surgery (men may shave face)  Do not bring valuables to the hospital.  Contacts, dentures or bridgework may not be worn into surgery.  Leave suitcase in the car. After surgery it may be brought to your room.  For patients admitted to the hospital more than one night, checkout time is 11:00                          The day of discharge.   Patients discharged the day of surgery will not be allowed to drive home                             If going home same day of surgery, must have someone stay with you first                           24 hrs at home and arrange for some one to drive you home from hospital.    Special Instructions:   Please read over the following fact sheets that you were given:                                   1. [place]               2. INCENTIVE SPIROMETER               3. CLEAR LIQUIDS ONLY 24 HRS PREOP                                                X_____________________________________________________________________        Failure to follow these instructions may result in cancellation of your surgery

## 2013-02-26 LAB — URINE CULTURE

## 2013-02-28 ENCOUNTER — Other Ambulatory Visit: Payer: Self-pay | Admitting: Gynecologic Oncology

## 2013-02-28 ENCOUNTER — Telehealth: Payer: Self-pay | Admitting: *Deleted

## 2013-02-28 DIAGNOSIS — C541 Malignant neoplasm of endometrium: Secondary | ICD-10-CM

## 2013-02-28 MED ORDER — AMOXICILLIN 500 MG PO CAPS: 500.0000 mg | ORAL_CAPSULE | Freq: Two times a day (BID) | ORAL | Status: DC

## 2013-02-28 NOTE — Telephone Encounter (Signed)
Call to pt with reminder -Clear liquids x24hrs, NPO after midnight, pt to give self enema tonight. Amoxicillin 500mg  1 PO BID x 5days sent to pt Rx, instructed pt on taking med beginning today,due to bacterial in the urine.Pt verbalized understanding. No questions at this tims

## 2013-02-28 NOTE — Progress Notes (Signed)
Pt informed of urine culture results.  Amoxicillin called in.  Instructions discussed with the patient by Armanda Magic, RN.  Instructed to call for any concerns.

## 2013-03-01 ENCOUNTER — Encounter (HOSPITAL_COMMUNITY): Payer: Self-pay | Admitting: *Deleted

## 2013-03-01 ENCOUNTER — Inpatient Hospital Stay (HOSPITAL_COMMUNITY): Payer: Medicare Other | Admitting: Certified Registered Nurse Anesthetist

## 2013-03-01 ENCOUNTER — Inpatient Hospital Stay (HOSPITAL_COMMUNITY)
Admission: RE | Admit: 2013-03-01 | Discharge: 2013-03-04 | DRG: 740 | Disposition: A | Discharge status: Home / Self Care | Payer: Medicare Other | Source: Ambulatory Visit | Attending: Obstetrics & Gynecology | Admitting: Obstetrics & Gynecology

## 2013-03-01 ENCOUNTER — Encounter (HOSPITAL_COMMUNITY): Payer: Medicare Other | Admitting: Certified Registered Nurse Anesthetist

## 2013-03-01 ENCOUNTER — Encounter (HOSPITAL_COMMUNITY)
Admission: RE | Disposition: A | Discharge status: Home / Self Care | Payer: Self-pay | Source: Ambulatory Visit | Attending: Obstetrics & Gynecology

## 2013-03-01 DIAGNOSIS — D259 Leiomyoma of uterus, unspecified: Secondary | ICD-10-CM | POA: Diagnosis present

## 2013-03-01 DIAGNOSIS — Z79899 Other long term (current) drug therapy: Secondary | ICD-10-CM

## 2013-03-01 DIAGNOSIS — C541 Malignant neoplasm of endometrium: Secondary | ICD-10-CM

## 2013-03-01 DIAGNOSIS — N390 Urinary tract infection, site not specified: Secondary | ICD-10-CM | POA: Diagnosis present

## 2013-03-01 DIAGNOSIS — C549 Malignant neoplasm of corpus uteri, unspecified: Principal | ICD-10-CM | POA: Diagnosis present

## 2013-03-01 DIAGNOSIS — H409 Unspecified glaucoma: Secondary | ICD-10-CM | POA: Diagnosis present

## 2013-03-01 HISTORY — PX: ABDOMINAL HYSTERECTOMY: SHX81

## 2013-03-01 HISTORY — PX: SALPINGOOPHORECTOMY: SHX82

## 2013-03-01 LAB — TYPE AND SCREEN
ABO/RH(D): A POS
Antibody Screen: NEGATIVE

## 2013-03-01 SURGERY — HYSTERECTOMY, ABDOMINAL
Anesthesia: General | Site: Abdomen

## 2013-03-01 MED ORDER — NEOSTIGMINE METHYLSULFATE 1 MG/ML IJ SOLN
INTRAMUSCULAR | Status: DC | PRN
  Administered 2013-03-01: 5 mg via INTRAVENOUS

## 2013-03-01 MED ORDER — ACETAMINOPHEN 10 MG/ML IV SOLN
1000.0000 mg | Freq: Four times a day (QID) | INTRAVENOUS | Status: DC
  Filled 2013-03-01 (×4): qty 100

## 2013-03-01 MED ORDER — LACTATED RINGERS IV SOLN
INTRAVENOUS | Status: DC | PRN
  Administered 2013-03-01 (×3): via INTRAVENOUS

## 2013-03-01 MED ORDER — NALOXONE HCL 0.4 MG/ML IJ SOLN
INTRAMUSCULAR | Status: DC | PRN
  Administered 2013-03-01: .04 mg via INTRAVENOUS

## 2013-03-01 MED ORDER — GLYCOPYRROLATE 0.2 MG/ML IJ SOLN
INTRAMUSCULAR | Status: DC | PRN
  Administered 2013-03-01: .6 mg via INTRAVENOUS
  Administered 2013-03-01: .1 mg via INTRAVENOUS

## 2013-03-01 MED ORDER — BRIMONIDINE TARTRATE 0.2 % OP SOLN
1.0000 [drp] | Freq: Three times a day (TID) | OPHTHALMIC | Status: DC
  Administered 2013-03-01 – 2013-03-03 (×7): 1 [drp] via OPHTHALMIC
  Filled 2013-03-01: qty 5

## 2013-03-01 MED ORDER — NEOSTIGMINE METHYLSULFATE 1 MG/ML IJ SOLN
INTRAMUSCULAR | Status: AC
  Filled 2013-03-01: qty 10

## 2013-03-01 MED ORDER — PHENYLEPHRINE HCL 10 MG/ML IJ SOLN
INTRAMUSCULAR | Status: DC | PRN
  Administered 2013-03-01: 40 ug via INTRAVENOUS

## 2013-03-01 MED ORDER — SUFENTANIL CITRATE 50 MCG/ML IV SOLN
INTRAVENOUS | Status: DC | PRN
  Administered 2013-03-01: 5 ug via INTRAVENOUS
  Administered 2013-03-01 (×2): 10 ug via INTRAVENOUS
  Administered 2013-03-01: 5 ug via INTRAVENOUS
  Administered 2013-03-01: 10 ug via INTRAVENOUS
  Administered 2013-03-01: 5 ug via INTRAVENOUS

## 2013-03-01 MED ORDER — ACETAMINOPHEN 10 MG/ML IV SOLN
INTRAVENOUS | Status: DC | PRN
  Administered 2013-03-01: 1000 mg via INTRAVENOUS

## 2013-03-01 MED ORDER — SUCCINYLCHOLINE CHLORIDE 20 MG/ML IJ SOLN
INTRAMUSCULAR | Status: DC | PRN
  Administered 2013-03-01: 100 mg via INTRAVENOUS

## 2013-03-01 MED ORDER — ONDANSETRON HCL 4 MG/2ML IJ SOLN
INTRAMUSCULAR | Status: DC | PRN
  Administered 2013-03-01 (×2): 2 mg via INTRAVENOUS

## 2013-03-01 MED ORDER — EPHEDRINE SULFATE 50 MG/ML IJ SOLN
INTRAMUSCULAR | Status: DC | PRN
  Administered 2013-03-01: 10 mg via INTRAVENOUS
  Administered 2013-03-01: 5 mg via INTRAVENOUS

## 2013-03-01 MED ORDER — LIDOCAINE HCL (CARDIAC) 20 MG/ML IV SOLN
INTRAVENOUS | Status: AC
  Filled 2013-03-01: qty 5

## 2013-03-01 MED ORDER — OXYCODONE HCL 5 MG PO TABS: 5.0000 mg | ORAL_TABLET | Freq: Once | ORAL | Status: DC | PRN

## 2013-03-01 MED ORDER — ENOXAPARIN SODIUM 40 MG/0.4ML ~~LOC~~ SOLN
40.0000 mg | SUBCUTANEOUS | Status: DC
  Administered 2013-03-02 – 2013-03-04 (×3): 40 mg via SUBCUTANEOUS
  Filled 2013-03-01 (×4): qty 0.4

## 2013-03-01 MED ORDER — BUPIVACAINE LIPOSOME 1.3 % IJ SUSP
20.0000 mL | Freq: Once | INTRAMUSCULAR | Status: AC
  Administered 2013-03-01: 20 mL
  Filled 2013-03-01: qty 20

## 2013-03-01 MED ORDER — CEFAZOLIN SODIUM-DEXTROSE 2-3 GM-% IV SOLR
2.0000 g | INTRAVENOUS | Status: AC
  Administered 2013-03-01: 2 g via INTRAVENOUS

## 2013-03-01 MED ORDER — SODIUM CHLORIDE 0.9 % IJ SOLN
INTRAMUSCULAR | Status: AC
  Filled 2013-03-01: qty 10

## 2013-03-01 MED ORDER — HYDROMORPHONE HCL PF 2 MG/ML IJ SOLN
INTRAMUSCULAR | Status: AC
  Filled 2013-03-01: qty 1

## 2013-03-01 MED ORDER — BRINZOLAMIDE 1 % OP SUSP
1.0000 [drp] | Freq: Two times a day (BID) | OPHTHALMIC | Status: DC
  Administered 2013-03-01 – 2013-03-03 (×5): 1 [drp] via OPHTHALMIC
  Filled 2013-03-01: qty 10

## 2013-03-01 MED ORDER — PROMETHAZINE HCL 25 MG/ML IJ SOLN: 6.2500 mg | INTRAMUSCULAR | Status: DC | PRN

## 2013-03-01 MED ORDER — NON FORMULARY: 1.0000 [drp] | Freq: Two times a day (BID) | Status: DC

## 2013-03-01 MED ORDER — ENSURE COMPLETE PO LIQD
237.0000 mL | Freq: Two times a day (BID) | ORAL | Status: DC
  Administered 2013-03-01 – 2013-03-02 (×2): 237 mL via ORAL
  Filled 2013-03-01 (×3): qty 237

## 2013-03-01 MED ORDER — MEPERIDINE HCL 50 MG/ML IJ SOLN: 6.2500 mg | INTRAMUSCULAR | Status: DC | PRN

## 2013-03-01 MED ORDER — MIDAZOLAM HCL 2 MG/2ML IJ SOLN
INTRAMUSCULAR | Status: AC
  Filled 2013-03-01: qty 2

## 2013-03-01 MED ORDER — FENTANYL CITRATE 0.05 MG/ML IJ SOLN
INTRAMUSCULAR | Status: AC
  Filled 2013-03-01: qty 5

## 2013-03-01 MED ORDER — DEXAMETHASONE SODIUM PHOSPHATE 10 MG/ML IJ SOLN
INTRAMUSCULAR | Status: DC | PRN
  Administered 2013-03-01: 10 mg via INTRAVENOUS

## 2013-03-01 MED ORDER — INFLUENZA VAC SPLIT QUAD 0.5 ML IM SUSP
0.5000 mL | INTRAMUSCULAR | Status: AC
  Administered 2013-03-03: 0.5 mL via INTRAMUSCULAR
  Filled 2013-03-01 (×2): qty 0.5

## 2013-03-01 MED ORDER — ONDANSETRON HCL 4 MG/2ML IJ SOLN
INTRAMUSCULAR | Status: AC
  Filled 2013-03-01: qty 2

## 2013-03-01 MED ORDER — GLYCOPYRROLATE 0.2 MG/ML IJ SOLN
INTRAMUSCULAR | Status: AC
  Filled 2013-03-01: qty 1

## 2013-03-01 MED ORDER — SUFENTANIL CITRATE 50 MCG/ML IV SOLN
INTRAVENOUS | Status: AC
Start: 2013-03-01 — End: 2013-03-01
  Filled 2013-03-01: qty 1

## 2013-03-01 MED ORDER — OXYCODONE HCL 5 MG/5ML PO SOLN
5.0000 mg | Freq: Once | ORAL | Status: DC | PRN
  Filled 2013-03-01: qty 5

## 2013-03-01 MED ORDER — ENOXAPARIN SODIUM 40 MG/0.4ML ~~LOC~~ SOLN
40.0000 mg | SUBCUTANEOUS | Status: AC
  Administered 2013-03-01: 40 mg via SUBCUTANEOUS
  Filled 2013-03-01: qty 0.4

## 2013-03-01 MED ORDER — KCL IN DEXTROSE-NACL 20-5-0.45 MEQ/L-%-% IV SOLN
INTRAVENOUS | Status: DC
Start: 2013-03-01 — End: 2013-03-02
  Administered 2013-03-01 – 2013-03-02 (×2): via INTRAVENOUS
  Filled 2013-03-01 (×3): qty 1000

## 2013-03-01 MED ORDER — ACETAMINOPHEN 500 MG PO TABS
1000.0000 mg | ORAL_TABLET | Freq: Four times a day (QID) | ORAL | Status: DC
  Administered 2013-03-01 – 2013-03-03 (×6): 1000 mg via ORAL
  Filled 2013-03-01 (×19): qty 2

## 2013-03-01 MED ORDER — SUFENTANIL CITRATE 50 MCG/ML IV SOLN
INTRAVENOUS | Status: AC
  Filled 2013-03-01: qty 1

## 2013-03-01 MED ORDER — HYDROMORPHONE HCL PF 1 MG/ML IJ SOLN: 0.2500 mg | INTRAMUSCULAR | Status: DC | PRN

## 2013-03-01 MED ORDER — HYDROMORPHONE HCL PF 1 MG/ML IJ SOLN
INTRAMUSCULAR | Status: DC | PRN
  Administered 2013-03-01 (×2): 1 mg via INTRAVENOUS

## 2013-03-01 MED ORDER — PROPOFOL 10 MG/ML IV BOLUS
INTRAVENOUS | Status: DC | PRN
  Administered 2013-03-01: 130 mg via INTRAVENOUS

## 2013-03-01 MED ORDER — CISATRACURIUM BESYLATE (PF) 10 MG/5ML IV SOLN
INTRAVENOUS | Status: DC | PRN
  Administered 2013-03-01: 4 mg via INTRAVENOUS
  Administered 2013-03-01: 8 mg via INTRAVENOUS

## 2013-03-01 MED ORDER — CISATRACURIUM BESYLATE 20 MG/10ML IV SOLN
INTRAVENOUS | Status: AC
  Filled 2013-03-01: qty 10

## 2013-03-01 MED ORDER — KETOROLAC TROMETHAMINE 30 MG/ML IJ SOLN
15.0000 mg | Freq: Four times a day (QID) | INTRAMUSCULAR | Status: AC
  Administered 2013-03-02: 15 mg via INTRAVENOUS
  Filled 2013-03-01 (×2): qty 1

## 2013-03-01 MED ORDER — CEFAZOLIN SODIUM-DEXTROSE 2-3 GM-% IV SOLR
INTRAVENOUS | Status: AC
  Filled 2013-03-01: qty 50

## 2013-03-01 MED ORDER — KETOROLAC TROMETHAMINE 30 MG/ML IJ SOLN: 15.0000 mg | Freq: Four times a day (QID) | INTRAMUSCULAR | Status: AC

## 2013-03-01 MED ORDER — ONDANSETRON HCL 4 MG PO TABS
4.0000 mg | ORAL_TABLET | Freq: Four times a day (QID) | ORAL | Status: DC | PRN
  Filled 2013-03-01: qty 1

## 2013-03-01 MED ORDER — SODIUM CHLORIDE 0.9 % IJ SOLN
INTRAMUSCULAR | Status: AC
  Filled 2013-03-01: qty 80

## 2013-03-01 MED ORDER — HEPARIN SODIUM (PORCINE) 1000 UNIT/ML IJ SOLN
INTRAMUSCULAR | Status: AC
  Filled 2013-03-01: qty 4

## 2013-03-01 MED ORDER — DEXAMETHASONE SODIUM PHOSPHATE 10 MG/ML IJ SOLN
INTRAMUSCULAR | Status: AC
  Filled 2013-03-01: qty 1

## 2013-03-01 MED ORDER — MIDAZOLAM HCL 5 MG/5ML IJ SOLN
INTRAMUSCULAR | Status: DC | PRN
  Administered 2013-03-01: .25 mg via INTRAVENOUS

## 2013-03-01 MED ORDER — LIDOCAINE HCL (CARDIAC) 20 MG/ML IV SOLN
INTRAVENOUS | Status: DC | PRN
  Administered 2013-03-01: 75 mg via INTRAVENOUS

## 2013-03-01 MED ORDER — NALOXONE HCL 0.4 MG/ML IJ SOLN
INTRAMUSCULAR | Status: AC
  Filled 2013-03-01: qty 1

## 2013-03-01 MED ORDER — ONDANSETRON HCL 4 MG/2ML IJ SOLN
4.0000 mg | Freq: Four times a day (QID) | INTRAMUSCULAR | Status: DC | PRN
  Administered 2013-03-02 (×2): 4 mg via INTRAVENOUS
  Filled 2013-03-01 (×2): qty 2

## 2013-03-01 MED ORDER — MAGNESIUM HYDROXIDE 400 MG/5ML PO SUSP
30.0000 mL | Freq: Three times a day (TID) | ORAL | Status: AC
  Administered 2013-03-01 – 2013-03-02 (×3): 30 mL via ORAL
  Filled 2013-03-01 (×6): qty 30

## 2013-03-01 MED ORDER — GLYCOPYRROLATE 0.2 MG/ML IJ SOLN
INTRAMUSCULAR | Status: AC
  Filled 2013-03-01: qty 3

## 2013-03-01 MED ORDER — OXYCODONE HCL 5 MG PO TABS: 5.0000 mg | ORAL_TABLET | ORAL | Status: DC | PRN

## 2013-03-01 MED ORDER — 0.9 % SODIUM CHLORIDE (POUR BTL) OPTIME
TOPICAL | Status: DC | PRN
  Administered 2013-03-01: 3000 mL

## 2013-03-01 MED ORDER — PROPOFOL 10 MG/ML IV BOLUS
INTRAVENOUS | Status: AC
  Filled 2013-03-01: qty 20

## 2013-03-01 MED ORDER — ZOLPIDEM TARTRATE 5 MG PO TABS: 5.0000 mg | ORAL_TABLET | Freq: Every evening | ORAL | Status: DC | PRN

## 2013-03-01 SURGICAL SUPPLY — 43 items
ATTRACTOMAT 16X20 MAGNETIC DRP (DRAPES) ×4 IMPLANT
BLADE EXTENDED COATED 6.5IN (ELECTRODE) ×4 IMPLANT
CANISTER SUCTION 2500CC (MISCELLANEOUS) IMPLANT
CHLORAPREP W/TINT 26ML (MISCELLANEOUS) ×4 IMPLANT
CLIP TI MEDIUM 6 (CLIP) ×8 IMPLANT
CLIP TI MEDIUM LARGE 6 (CLIP) ×20 IMPLANT
CONT SPEC 4OZ CLIKSEAL STRL BL (MISCELLANEOUS) ×4 IMPLANT
COVER SURGICAL LIGHT HANDLE (MISCELLANEOUS) IMPLANT
DRAPE UTILITY W/TAPE 26X15 (DRAPES) ×4 IMPLANT
DRAPE WARM FLUID 44X44 (DRAPE) ×4 IMPLANT
DRSG TELFA 4X10 ISLAND STR (GAUZE/BANDAGES/DRESSINGS) ×4 IMPLANT
ELECT REM PT RETURN 9FT ADLT (ELECTROSURGICAL) ×4
ELECTRODE REM PT RTRN 9FT ADLT (ELECTROSURGICAL) ×2 IMPLANT
GAUZE SPONGE 4X4 16PLY XRAY LF (GAUZE/BANDAGES/DRESSINGS) ×4 IMPLANT
GLOVE BIO SURGEON STRL SZ 6.5 (GLOVE) ×3 IMPLANT
GLOVE BIO SURGEONS STRL SZ 6.5 (GLOVE) ×1
GLOVE BIOGEL M STRL SZ7.5 (GLOVE) ×20 IMPLANT
GOWN STRL REUS W/TWL LRG LVL3 (GOWN DISPOSABLE) ×4 IMPLANT
GOWN STRL REUS W/TWL XL LVL3 (GOWN DISPOSABLE) ×4 IMPLANT
KIT BASIN OR (CUSTOM PROCEDURE TRAY) ×4 IMPLANT
NS IRRIG 1000ML POUR BTL (IV SOLUTION) IMPLANT
PACK GENERAL/GYN (CUSTOM PROCEDURE TRAY) ×4 IMPLANT
SHEET LAVH (DRAPES) ×4 IMPLANT
SPONGE GAUZE 4X4 12PLY (GAUZE/BANDAGES/DRESSINGS) IMPLANT
SPONGE LAP 18X18 X RAY DECT (DISPOSABLE) ×4 IMPLANT
STAPLER VISISTAT 35W (STAPLE) ×4 IMPLANT
SUT ETHILON 1 LR 30 (SUTURE) IMPLANT
SUT PDS AB 1 CTXB1 36 (SUTURE) ×8 IMPLANT
SUT SILK 2 0 (SUTURE)
SUT SILK 2 0 30  PSL (SUTURE)
SUT SILK 2 0 30 PSL (SUTURE) IMPLANT
SUT SILK 2-0 18XBRD TIE 12 (SUTURE) IMPLANT
SUT VIC AB 0 CT1 36 (SUTURE) ×12 IMPLANT
SUT VIC AB 2-0 CT2 27 (SUTURE) ×16 IMPLANT
SUT VIC AB 2-0 SH 27 (SUTURE) ×4
SUT VIC AB 2-0 SH 27X BRD (SUTURE) ×4 IMPLANT
SUT VIC AB 3-0 CTX 36 (SUTURE) IMPLANT
SUT VICRYL 2 0 18  UND BR (SUTURE) ×2
SUT VICRYL 2 0 18 UND BR (SUTURE) ×2 IMPLANT
TOWEL OR 17X26 10 PK STRL BLUE (TOWEL DISPOSABLE) ×4 IMPLANT
TOWEL OR NON WOVEN STRL DISP B (DISPOSABLE) ×4 IMPLANT
TRAY FOLEY CATH 14FRSI W/METER (CATHETERS) ×4 IMPLANT
WATER STERILE IRR 1500ML POUR (IV SOLUTION) ×4 IMPLANT

## 2013-03-01 NOTE — Transfer of Care (Signed)
Immediate Anesthesia Transfer of Care Note  Patient: Ebony Herring  Procedure(s) Performed: Procedure(s): HYSTERECTOMY ABDOMINAL TOTAL/BILATERAL SALPINGO OOPHORECTOMY/WITH PELVIC AND PARA AORTIC LYMPHADNECTOMY  (N/A) BILATERAL SALPINGO OOPHORECTOMY (Bilateral)  Patient Location: PACU  Anesthesia Type:General  Level of Consciousness: awake, patient cooperative, lethargic and responds to stimulation  Airway & Oxygen Therapy: Patient Spontanous Breathing and Patient connected to face mask oxygen  Post-op Assessment: Report given to PACU RN, Post -op Vital signs reviewed and stable and Patient moving all extremities  Post vital signs: Reviewed and stable  Complications: No apparent anesthesia complications

## 2013-03-01 NOTE — Anesthesia Preprocedure Evaluation (Addendum)
Anesthesia Evaluation  Patient identified by MRN, date of birth, ID band Patient awake    Reviewed: Allergy & Precautions, H&P , NPO status , Patient's Chart, lab work & pertinent test results  Airway Mallampati: II TM Distance: >3 FB Neck ROM: Full    Dental  (+) Dental Advisory Given   Pulmonary neg pulmonary ROS,  breath sounds clear to auscultation        Cardiovascular + dysrhythmias Rhythm:Regular Rate:Normal     Neuro/Psych negative neurological ROS  negative psych ROS   GI/Hepatic negative GI ROS, Neg liver ROS,   Endo/Other  negative endocrine ROS  Renal/GU negative Renal ROS     Musculoskeletal negative musculoskeletal ROS (+)   Abdominal   Peds  Hematology negative hematology ROS (+)   Anesthesia Other Findings   Reproductive/Obstetrics negative OB ROS                           Anesthesia Physical Anesthesia Plan  ASA: II  Anesthesia Plan: General   Post-op Pain Management:    Induction: Intravenous  Airway Management Planned: Oral ETT  Additional Equipment:   Intra-op Plan:   Post-operative Plan: Extubation in OR  Informed Consent: I have reviewed the patients History and Physical, chart, labs and discussed the procedure including the risks, benefits and alternatives for the proposed anesthesia with the patient or authorized representative who has indicated his/her understanding and acceptance.   Dental advisory given  Plan Discussed with: CRNA  Anesthesia Plan Comments:         Anesthesia Quick Evaluation

## 2013-03-01 NOTE — H&P (View-Only) (Signed)
Consult Note: Gyn-Onc   Ebony Herring 80 y.o. female  No chief complaint on file.   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 Uterine fibroids.  Plan: Total abdominal hysterectomy and bilateral salpingo-oophorectomy, pelvic and para-aortic lymphadenectomy which is scheduled for 03/01/2013.  Natural history of disease as well as the possibility of postoperative adjuvant therapy were reviewed with the patient and her husband. Risks of surgery were also outlined all questions are answered.   HPI: 80 year old white married female seen in consultation at the request of Dr. Gaetano Net regarding the management of a newly diagnosed high-grade endometrial carcinoma. The patient's history dates back to approximately November when she developed of vaginal discharge. Overtime some blood was noted in the discharge. Ultimately the patient was referred to Dr. Gertie Fey for further evaluation. An endometrial biopsy was obtained on 01/26/2013 showed a high-grade endometrial carcinoma with serous features. Subsequently the patient's had an MRI of the pelvis showing a fibroid uterus and prominent bilateral iliac lymph nodes measuring between 1 cm and 7 mm. Currently the patient is having minimal bleeding and no significant pain.  Past gynecologic history includes a bilateral tubal ligation. Obstetrical history gravida 2.    Review of Systems:10 point review of systems is negative except as noted in interval history.   Vitals: Blood pressure 149/79, pulse 72, temperature 98.3 F (36.8 C), resp. rate 16, height 5' 2.44" (1.586 m), weight 136 lb (61.689 kg).  Physical Exam: General : The patient is a healthy woman in no acute distress.  HEENT: normocephalic, extraoccular movements normal; neck is supple without thyromegally  Lynphnodes: Supraclavicular and inguinal nodes not enlarged  Abdomen: Soft, non-tender, no ascites, no organomegally, no masses, no hernias she has a well-healed  scar at McBurney's point and small incisions from her tubal ligation and laparoscopic cholecystectomy Pelvic:  EGBUS: Normal female  Vagina: Normal, no lesions  Urethra and Bladder: Normal, non-tender  Cervix: Normal Uterus: Irregular in shape consistent with uterine fibroids. Bi-manual examination: Non-tender; no adenxal masses or nodularity  Rectal: normal sphincter tone, no masses, no blood  Lower extremities: No edema or varicosities. Normal range of motion      Allergies  Allergen Reactions  . Bactrim [Sulfamethoxazole-Trimethoprim]   . Ciprofloxacin     Unknown if this or bactrim caused allergic reaction    Past Medical History  Diagnosis Date  . Glaucoma     High pressure- ?pre glaucoma    Past Surgical History  Procedure Laterality Date  . Cholecystectomy    . Appendectomy      Current Outpatient Prescriptions  Medication Sig Dispense Refill  . Brinzolamide-Brimonidine (SIMBRINZA) 1-0.2 % SUSP Apply 1 drop to eye 2 (two) times daily.      . fish oil-omega-3 fatty acids 1000 MG capsule Take 2 g by mouth daily.       No current facility-administered medications for this visit.    History   Social History  . Marital Status: Married    Spouse Name: N/A    Number of Children: N/A  . Years of Education: N/A   Occupational History  . Not on file.   Social History Main Topics  . Smoking status: Never Smoker   . Smokeless tobacco: Never Used  . Alcohol Use: No  . Drug Use: No  . Sexual Activity: Not on file   Other Topics Concern  . Not on file   Social History Narrative  . No narrative on file    History reviewed. No pertinent  family history.    [name],Joel Cowin L, MD 02/18/2013, 11:18 AM

## 2013-03-01 NOTE — Anesthesia Postprocedure Evaluation (Signed)
Anesthesia Post Note  Patient: Ebony Herring  Procedure(s) Performed: Procedure(s) (LRB): HYSTERECTOMY ABDOMINAL TOTAL/BILATERAL SALPINGO OOPHORECTOMY/WITH PELVIC AND PARA AORTIC LYMPHADNECTOMY  (N/A) BILATERAL SALPINGO OOPHORECTOMY (Bilateral)  Anesthesia type: General  Patient location: PACU  Post pain: Pain level controlled  Post assessment: Post-op Vital signs reviewed  Last Vitals:  Filed Vitals:   03/01/13 1140  BP:   Pulse: 61  Temp:   Resp: 13    Post vital signs: Reviewed  Level of consciousness: sedated  Complications: No apparent anesthesia complications

## 2013-03-01 NOTE — Progress Notes (Signed)
Pt arrived to floor via stretcher. Pt A&Ox4. Dressing with minimal drainage, shadowing marked. Pt oriented to room and call bell. VSS. Will continue to monitor.

## 2013-03-01 NOTE — Progress Notes (Signed)
UR completed 

## 2013-03-01 NOTE — Interval H&P Note (Signed)
History and Physical Interval Note:  03/01/2013 7:01 AM  Ebony Herring  has presented today for surgery, with the diagnosis of high grade endometrial carcinoma  The various methods of treatment have been discussed with the patient and family. After consideration of risks, benefits and other options for treatment, the patient has consented to  Procedure(s): HYSTERECTOMY ABDOMINAL TOTAL/BILATERAL SALPINGO OOPHORECTOMY/WITH PELVIC AND PARA AORTIC LYMPHADNECTOMY  (N/A) BILATERAL SALPINGO OOPHORECTOMY (Bilateral) as a surgical intervention .  The patient's history has been reviewed, patient examined, no change in status, stable for surgery.  I have reviewed the patient's chart and labs.  Questions were answered to the patient's satisfaction.     [name],Jaiyla Granados L

## 2013-03-01 NOTE — Op Note (Signed)
Ebony Herring  female MEDICAL RECORD NO:[id number] DATE OF BIRTH: [birth year] PHYSICIAN: Marti Sleigh, M.D  03/01/2013   OPERATIVE REPORT  PREOPERATIVE DIAGNOSIS: Grade 3 endometrial carcinoma  POSTOPERATIVE DIAGNOSIS: Same with a suspicious pelvic and para-aortic lymph nodes.  PROCEDURE: Total abdominal hysterectomy, bilateral salpingo-oophorectomy, pelvic and para-aortic lymphadenectomy.  SURGEON: Marti Sleigh, M.D ASSISTANT: Lahoma Crocker M.D. ANESTHESIA: Gen. with oral tracheal tube ESTIMATED BLOOD LOSS: 75 ML SURGICAL FINDINGS: At the time of exploratory laparotomy the upper abdomen was normal. The appendix was surgically absent. There were some palpably enlarged periaortic and pelvic lymph nodes. Uterus was slightly enlarged with multiple fibroids. At the completion of surgery there was no apparent gross residual disease.  PROCEDURE: The patient was brought to the operating room and after satisfactory attainment of general anesthesia was placed in a modified lithotomy position in [place]. The anterior abdominal wall, perineum and vagina were prepped, Foley catheter was inserted, the patient was draped. A time-out was taken, SCDs were in place, prophylactic antibiotics were administered.  The abdomen was entered through a vertical incision. Peritoneal washings were taken and sent to cytopathology.   Bookwalter retractor was assembled and  the small bowel and sigmoid colon were elevated out of the pelvis thus exposing the uterus. The uterus was grasped with two long Kelly clamps.  The right round ligament was divided the retroperitoneal spaces opened. The iliac vessels and ureter were identified. The ovarian vessels were skeletonized clamped, cut, free tied and suture ligated. Similar procedures performed left side the pelvis. The bladder flap was advanced with sharp and blunt dissection. Uterine vessels were skeletonized then clamped cut and suture ligated. In  the paracervical and cardinal ligaments were clamped, cut and suture ligated. Vaginal angles were encountered, crossclamped and the vagina transected from its connection to the cervix. The uterus, cervix,tubes and ovaries were handed off the operative field. The vaginal angles were transfixed with 0 Vicryl the central portion of vagina closed with interrupted figure-of-eight sutures of 0 Vicryl. The pelvis was irrigated and hemostasis was ascertained.  Attention was turned to the pelvic sidewall. Lymph nodes overlying the external iliac artery and vein, internal iliac artery and obturator fossa were resected. On the right there was an approximately 3 cm obturator lymph node immediately adjacent to the obturator nerve and hypogastric vein. Hemostasis achieved with cautery and hemoclips.  Bookwalter retractor was repositioned and the incision extended above the umbilicus. The small bowel mesentery was mobilized to the right. Incision was made over the right common iliac artery and along the aorta to 3 cm above the origin of the inferior mesenteric artery. The right ureter was mobilized laterally and held behind retractor. Lymph nodes overlying the right common iliac artery and along the aorta and vena cava were resected. There were 2 very suspicious lymph nodes there were resected in this process. Hemostasis achieved with cautery and hemoclips. Surgical sites were reinspected and found to be hemostatic.  The retractor was taken down   The abdomen and pelvis were irrigated.  The anterior abdominal wall was closed in layers the first being a running mass closure using #1 PDS. Subcutaneous tissue was irrigated and hemostasis achieved with cautery. Experal (266 mg diluted in 40 ml saline)was injected into the subcutaneous layer. Skin was closed with skin staples. A dressing was applied.  Patient was awakened from anesthesia and taken to the recovery room in satisfactory condition. Sponge needle and instrument  counts correct times two.   Marti Sleigh, M.D

## 2013-03-01 NOTE — Plan of Care (Signed)
Problem: Phase I Progression Outcomes Goal: Pain controlled with appropriate interventions Outcome: Progressing Pt states her pain level is 2/10. Refuses Toradol and wants to see if the scheduled Tylenol will be enough to control her pain.

## 2013-03-01 NOTE — Anesthesia Procedure Notes (Signed)
Procedure Name: Intubation Date/Time: 03/01/2013 7:40 AM Performed by: Ofilia Neas Pre-anesthesia Checklist: Patient identified, Timeout performed, Emergency Drugs available, Suction available and Patient being monitored Patient Re-evaluated:Patient Re-evaluated prior to inductionOxygen Delivery Method: Circle system utilized Preoxygenation: Pre-oxygenation with 100% oxygen Intubation Type: IV induction Ventilation: Mask ventilation without difficulty Laryngoscope Size: Mac and 4 Grade View: Grade II Tube type: Oral Tube size: 7.0 mm Number of attempts: 1 Airway Equipment and Method: Stylet Placement Confirmation: ETT inserted through vocal cords under direct vision,  positive ETCO2 and breath sounds checked- equal and bilateral (anterior, cords visualized with head lift) Secured at: 21 cm Tube secured with: Tape Dental Injury: Teeth and Oropharynx as per pre-operative assessment

## 2013-03-01 NOTE — Preoperative (Signed)
Beta Blockers   Reason not to administer Beta Blockers:Not Applicable 

## 2013-03-02 ENCOUNTER — Encounter (HOSPITAL_COMMUNITY): Payer: Self-pay | Admitting: Gynecology

## 2013-03-02 LAB — CBC
HEMATOCRIT: 35.9 % — AB (ref 36.0–46.0)
Hemoglobin: 11.6 g/dL — ABNORMAL LOW (ref 12.0–15.0)
MCH: 29.7 pg (ref 26.0–34.0)
MCHC: 32.3 g/dL (ref 30.0–36.0)
MCV: 91.8 fL (ref 78.0–100.0)
Platelets: 306 10*3/uL (ref 150–400)
RBC: 3.91 MIL/uL (ref 3.87–5.11)
RDW: 12.8 % (ref 11.5–15.5)
WBC: 15 10*3/uL — ABNORMAL HIGH (ref 4.0–10.5)

## 2013-03-02 LAB — BASIC METABOLIC PANEL
BUN: 11 mg/dL (ref 6–23)
CO2: 27 mEq/L (ref 19–32)
Calcium: 8.7 mg/dL (ref 8.4–10.5)
Chloride: 99 mEq/L (ref 96–112)
Creatinine, Ser: 0.9 mg/dL (ref 0.50–1.10)
GFR calc Af Amer: 69 mL/min — ABNORMAL LOW (ref >90)
GFR, EST NON AFRICAN AMERICAN: 59 mL/min — AB (ref >90)
GLUCOSE: 138 mg/dL — AB (ref 70–99)
Potassium: 5 mEq/L (ref 3.7–5.3)
Sodium: 137 mEq/L (ref 137–147)

## 2013-03-02 MED ORDER — KCL IN DEXTROSE-NACL 20-5-0.45 MEQ/L-%-% IV SOLN
INTRAVENOUS | Status: DC
Start: 2013-03-02 — End: 2013-03-03
  Administered 2013-03-02 – 2013-03-03 (×2): via INTRAVENOUS
  Filled 2013-03-02 (×4): qty 1000

## 2013-03-02 MED ORDER — KCL IN DEXTROSE-NACL 20-5-0.45 MEQ/L-%-% IV SOLN
INTRAVENOUS | Status: DC
  Filled 2013-03-02: qty 1000

## 2013-03-02 MED ORDER — AMOXICILLIN 500 MG PO CAPS
500.0000 mg | ORAL_CAPSULE | Freq: Two times a day (BID) | ORAL | Status: DC
  Administered 2013-03-02 (×2): 500 mg via ORAL
  Filled 2013-03-02 (×6): qty 1

## 2013-03-02 MED ORDER — ENOXAPARIN (LOVENOX) PATIENT EDUCATION KIT
PACK | Freq: Once | Status: AC
  Administered 2013-03-02: 11:00:00
  Filled 2013-03-02: qty 1

## 2013-03-02 NOTE — Progress Notes (Signed)
INITIAL NUTRITION ASSESSMENT  DOCUMENTATION CODES Per approved criteria  -Not Applicable   INTERVENTION: - Continue Ensure Complete BID - Encouraged increased meal intake - Will continue to monitor   NUTRITION DIAGNOSIS: Inadequate oral intake related to poor appetite as evidenced by 0% meal intake.   Goal: Pt to consume >90% of meals/supplements  Monitor:  Weights, labs, intake   Reason for Assessment: Malnutrition screening tool   80 y.o. female  Admitting Dx: Total abdominal hysterectomy and bilateral salpingo-oophorectomy, pelvic and para-aortic lymphadenectomy  ASSESSMENT: Pt with high-grade endometrial CA, admitted for Total abdominal hysterectomy and bilateral salpingo-oophorectomy, pelvic and para-aortic lymphadenectomy which was performed 03/01/13. Met with pt who reports eating well before surgery, 3 meals/day with stable weight. Pt c/o poor appetite after surgery. Agreeable to trying Ensure Complete.   Height: Ht Readings from Last 1 Encounters:  03/01/13 $RemoveB'5\' 2"'[id number]$  (1.575 m)    Weight: Wt Readings from Last 1 Encounters:  03/01/13 139 lb (63.05 kg)    Ideal Body Weight: 110 lb   % Ideal Body Weight: 126%  Wt Readings from Last 10 Encounters:  03/01/13 139 lb (63.05 kg)  03/01/13 139 lb (63.05 kg)  02/25/13 133 lb 8 oz (60.555 kg)  02/18/13 136 lb (61.689 kg)  12/31/12 139 lb 8 oz (63.277 kg)  11/30/12 139 lb 8 oz (63.277 kg)  10/27/12 138 lb (62.596 kg)  09/29/12 138 lb (62.596 kg)  04/10/11 139 lb (63.05 kg)    Usual Body Weight: 139 lb   % Usual Body Weight: 100%  BMI:  Body mass index is 25.42 kg/(m^2).  Estimated Nutritional Needs: Kcal:1575-1775 Protein: 75-90g Fluid: 1.5-1.7L/day  Skin: Perineum and abdominal incision   Diet Order: General  EDUCATION NEEDS: -No education needs identified at this time   Intake/Output Summary (Last 24 hours) at 03/02/13 1113 Last data filed at 03/02/13 1000  Gross per 24 hour  Intake 1137.5 ml   Output   1025 ml  Net  112.5 ml    Last BM: 1/20  Labs:   Recent Labs Lab 02/25/13 1200 03/02/13 0516  NA 142 137  K 4.8 5.0  CL 103 99  CO2 26 27  BUN 16 11  CREATININE 0.84 0.90  CALCIUM 9.5 8.7  GLUCOSE 99 138*    CBG (last 3)  No results found for this basename: GLUCAP,  in the last 72 hours  Scheduled Meds: . acetaminophen  1,000 mg Oral Q6H  . amoxicillin  500 mg Oral BID  . brinzolamide  1 drop Both Eyes BID   And  . brimonidine  1 drop Both Eyes TID  . enoxaparin (LOVENOX) injection  40 mg Subcutaneous Q24H  . feeding supplement (ENSURE COMPLETE)  237 mL Oral BID BM  . influenza vac split quadrivalent PF  0.5 mL Intramuscular Tomorrow-1000  . ketorolac  15 mg Intravenous Q6H   Or  . ketorolac  15 mg Intramuscular Q6H  . magnesium hydroxide  30 mL Oral Q8H    Continuous Infusions:   Past Medical History  Diagnosis Date  . Glaucoma     High pressure- ?pre glaucoma  . Cancer     ENDOMETRIAL CANCER    Past Surgical History  Procedure Laterality Date  . Cholecystectomy    . Appendectomy      Mikey College MS, RD, LDN [phone] Pager [phone] After Hours Pager

## 2013-03-02 NOTE — Progress Notes (Signed)
1 Day Post-Op Procedure(s) (LRB): HYSTERECTOMY ABDOMINAL TOTAL/BILATERAL SALPINGO OOPHORECTOMY/WITH PELVIC AND PARA AORTIC LYMPHADNECTOMY  (N/A) BILATERAL SALPINGO OOPHORECTOMY (Bilateral)  Subjective: Patient reports doing well this am.  Ambulated with assistance yesterday.  Tolerating diet with no nausea or emesis.  Minimal pain reported.  No concerns voiced.     Objective: Vital signs in last 24 hours: Temp:  [96.6 F (35.9 C)-98.9 F (37.2 C)] 98.9 F (37.2 C) (01/21 0540) Pulse Rate:  [43-87] 66 (01/21 0540) Resp:  [7-23] 18 (01/21 0540) BP: (117-154)/(50-85) 140/76 mmHg (01/21 0540) SpO2:  [95 %-100 %] 95 % (01/21 0540) Weight:  [139 lb (63.05 kg)] 139 lb (63.05 kg) (01/20 1254) Last BM Date: 03/01/13  Intake/Output from previous day: 01/20 0701 - 01/21 0700 In: 2147.5 [P.O.:120; I.V.:2027.5] Out: 2725 [Urine:1115; Blood:50]  Physical Examination: General: alert, cooperative and no distress Resp: clear to auscultation bilaterally Cardio: regular rate and rhythm, S1, S2 normal, no murmur, click, rub or gallop GI: soft, non-tender; bowel sounds normal; no masses,  no organomegaly and incision: midline incision with staples clean, dry, and intact Extremities: extremities normal, atraumatic, no cyanosis or edema  Labs: WBC/Hgb/Hct/Plts:  15.0/11.6/35.9/306 (01/21 0516) BUN/Cr/glu/ALT/AST/amyl/lip:  11/0.90/--/--/--/--/-- (01/21 3664)  Assessment: 80 y.o. s/p Procedure(s): HYSTERECTOMY ABDOMINAL TOTAL/BILATERAL SALPINGO OOPHORECTOMY/WITH PELVIC AND PARA AORTIC LYMPHADNECTOMY  BILATERAL SALPINGO OOPHORECTOMY: stable Pain:  Pain is well-controlled on PRN medications.  Heme:  Hgb 11.6 and Hct 35.9 this am.  Stable post-op.  CV: BP and HR stable post-operatively.  GI:  Tolerating po: Yes.     GU:  Adequate urine output.  FEN:  Stable post-operatively.  Prophylaxis: intermittent pneumatic compression boots and lovenox daily.  Plan: Saline lock IV Resume pre-op dose  of Amoxicillin for UTI Encourage ambulation, IS use, deep breathing, and coughing Continue post-operative plan of care per Dr. Delsa Sale   LOS: 1 day    Kazi Montoro DEAL 03/02/2013, 9:51 AM

## 2013-03-03 MED ORDER — BISACODYL 10 MG RE SUPP
10.0000 mg | Freq: Once | RECTAL | Status: AC
  Administered 2013-03-03: 10 mg via RECTAL
  Filled 2013-03-03: qty 1

## 2013-03-03 MED ORDER — IBUPROFEN 600 MG PO TABS
600.0000 mg | ORAL_TABLET | Freq: Three times a day (TID) | ORAL | Status: DC
  Filled 2013-03-03 (×6): qty 1

## 2013-03-03 MED ORDER — KCL IN DEXTROSE-NACL 20-5-0.45 MEQ/L-%-% IV SOLN
INTRAVENOUS | Status: DC
  Administered 2013-03-04: 02:00:00 via INTRAVENOUS
  Filled 2013-03-03 (×2): qty 1000

## 2013-03-03 NOTE — Progress Notes (Signed)
Patient up to the restroom voided and had liquid bm.  Unable to get measurable amount.  Bladder scanned patient to make sure she did not have urinary retention, bladder scan <50.  Patient reports no discomfort

## 2013-03-03 NOTE — Progress Notes (Signed)
2 Days Post-Op Procedure(s) (LRB): HYSTERECTOMY ABDOMINAL TOTAL/BILATERAL SALPINGO OOPHORECTOMY/WITH PELVIC AND PARA AORTIC LYMPHADNECTOMY  (N/A) BILATERAL SALPINGO OOPHORECTOMY (Bilateral)  Subjective: Patient reports feeling better this am but still has mild nausea.  Ambulating without difficulty.  After lunch yesterday, she vomited a moderate amount and vomited three more times that afternoon.  Has had a bowel movement but passing little flatus.  Minimal pain reported.  No concerns voiced.     Objective: Vital signs in last 24 hours: Temp:  [97.7 F (36.5 C)-98.6 F (37 C)] 98.3 F (36.8 C) (01/22 0600) Pulse Rate:  [69-77] 76 (01/22 0600) Resp:  [16-18] 16 (01/22 0600) BP: (127-174)/(77-94) 174/84 mmHg (01/22 0600) SpO2:  [95 %-96 %] 95 % (01/22 0600) Last BM Date: 03/02/13  Intake/Output from previous day: 01/21 0701 - 01/22 0700 In: 1636.7 [P.O.:480; I.V.:1156.7] Out: 1050 [Urine:1050]  Physical Examination: General: alert, cooperative and no distress Resp: clear to auscultation bilaterally Cardio: regular rate and rhythm, S1, S2 normal, no murmur, click, rub or gallop GI: soft, non-tender; bowel sounds normal; no masses,  no organomegaly, incision: midline incision with staples clean, dry, and intact and abdomen tympanic on percussion Extremities: extremities normal, atraumatic, no cyanosis or edema  Assessment: 80 y.o. s/p Procedure(s): HYSTERECTOMY ABDOMINAL TOTAL/BILATERAL SALPINGO OOPHORECTOMY/WITH PELVIC AND PARA AORTIC LYMPHADNECTOMY  BILATERAL SALPINGO OOPHORECTOMY: stable Pain:  Pain is well-controlled on PRN medications.  Heme:  Hgb 11.6 and Hct 35.9 03/02/13 am.  Stable post-op.  CV: BP and HR stable post-operatively.  GI:  Nausea.  IVF resumed yesterday evening.     GU:  Adequate urine output.  FEN:  Stable post-operatively.  Prophylaxis: intermittent pneumatic compression boots and lovenox daily.  Plan: Clear liquid diet until nausea  resolves Dulcolax suppository this am Encourage ambulation, IS use, deep breathing, and coughing Continue post-operative plan of care per Dr. Delsa Sale   LOS: 2 days    Lashia Niese DEAL 03/03/2013, 10:59 AM

## 2013-03-04 MED ORDER — ENOXAPARIN SODIUM 40 MG/0.4ML ~~LOC~~ SOLN: 40.0000 mg | SUBCUTANEOUS | Status: DC

## 2013-03-04 MED ORDER — OXYCODONE HCL 5 MG PO TABS: 5.0000 mg | ORAL_TABLET | ORAL | Status: DC | PRN

## 2013-03-04 NOTE — Discharge Summary (Signed)
Physician Discharge Summary  Patient ID: Ebony Herring MRN: [id number] DOB/AGE: [birth year] 80 y.o.  Admit date: 03/01/2013 Discharge date: 03/04/2013  Admission Diagnoses: Endometrial cancer  Discharge Diagnoses:  Principal Problem:   Endometrial cancer   Discharged Condition:  The patient is in good condition and stable for discharge.   Hospital Course: On 03/01/2013, the patient underwent the following: Procedure(s): HYSTERECTOMY ABDOMINAL TOTAL/BILATERAL SALPINGO OOPHORECTOMY/WITH PELVIC AND PARA AORTIC LYMPHADENECTOMY, BILATERAL SALPINGO OOPHORECTOMY.  The postoperative course was uneventful.  She was discharged to home on postoperative day 3 tolerating a regular diet, passing flatus, and having bowel movements.  Final path discussed.  Consults: None  Significant Diagnostic Studies: None  Treatments: surgery: see above  Discharge Exam: Blood pressure 151/86, pulse 72, temperature 98.8 F (37.1 C), temperature source Oral, resp. rate 16, height 5\' 2"  (1.575 m), weight 139 lb (63.05 kg), SpO2 94.00%. General appearance: alert, cooperative and no distress Resp: clear to auscultation bilaterally Cardio: regular rate and rhythm, S1, S2 normal, no murmur, click, rub or gallop GI: soft, non-tender; bowel sounds normal; no masses,  no organomegaly Extremities: extremities normal, atraumatic, no cyanosis or edema Incision/Wound: Midline incision with staples open to air, no drainage or erythema noted  Disposition: 01-Home or Self Care  Discharge Orders   Future Appointments Provider Department Dept Phone   03/07/2013 10:45 AM Ebony Gibbs, NP [facility] [phone]   Future Orders Complete By Expires   Call MD for:  difficulty breathing, headache or visual disturbances  As directed    Call MD for:  extreme fatigue  As directed    Call MD for:  hives  As directed    Call MD for:  persistant dizziness or light-headedness  As directed    Call MD for:  persistant nausea and vomiting  As directed    Call MD for:  redness, tenderness, or signs of infection (pain, swelling, redness, odor or green/yellow discharge around incision site)  As directed    Call MD for:  severe uncontrolled pain  As directed    Call MD for:  temperature >100.4  As directed    Diet - low sodium heart healthy  As directed    Driving Restrictions  As directed    Comments:     No driving for 2 weeks.  Do not take narcotics and drive.   Increase activity slowly  As directed    Lifting restrictions  As directed    Comments:     No lifting greater than 10 lbs.   Sexual Activity Restrictions  As directed    Comments:     No sexual activity, nothing in the vagina, for 6 weeks.       Medication List    STOP taking these medications       amoxicillin 500 MG capsule  Commonly known as:  AMOXIL      TAKE these medications       enoxaparin 40 MG/0.4ML injection  Commonly known as:  LOVENOX  Inject 0.4 mLs (40 mg total) into the skin daily.     fish oil-omega-3 fatty acids 1000 MG capsule  Take 1 g by mouth 2 (two) times daily.     oxyCODONE 5 MG immediate release tablet  Commonly known as:  Oxy IR/ROXICODONE  Take 1 tablet (5 mg total) by mouth every 4 (four) hours as needed for moderate pain or severe pain.     SIMBRINZA 1-0.2 % Susp  Generic drug:  Brinzolamide-Brimonidine  Place 1 drop into both eyes 2 (two) times daily.           Follow-up Information   Follow up with Ebony Stenseth DEAL, NP On 03/07/2013. (at 10:45 am at the [facility])    Specialty:  [place] information:   [place] [id number] [phone]       Greater than thirty minutes were spend for face to face discharge instructions and discharge orders/summary in EPIC.   Signed: Zyaire Dumas Herring 03/04/2013, 10:06 AM

## 2013-03-04 NOTE — Discharge Instructions (Signed)
03/04/2013  Activity: 1. Be up and out of the bed during the day.  Take a nap if needed.  You may walk up steps but be careful and use the hand rail.  Stair climbing will tire you more than you think, you may need to stop part way and rest.   2. No lifting or straining for 6 weeks.  3. No driving for 2 weeks.  Do not drive if you are taking narcotic pain medicine.  4. Shower daily.  Use soap and water on your incision and pat dry; don't rub.   5. No sexual activity and nothing in the vagina for 6 weeks.  Diet: 1. Low sodium Heart Healthy Diet is recommended.  2. It is safe to use a laxative if you have difficulty moving your bowels.   Wound Care: 1. Keep clean and dry.  Shower daily.  Reasons to call the Doctor:  Fever - Oral temperature greater than 100.4 degrees Fahrenheit  Foul-smelling vaginal discharge  Difficulty urinating  Nausea and vomiting  Increased pain at the site of the incision that is unrelieved with pain medicine.  Difficulty breathing with or without chest pain  New calf pain especially if only on one side  Sudden, continuing increased vaginal bleeding with or without clots.   Contacts: For questions or concerns you should contact:  Dr. Lahoma Crocker at [phone]  Dr. Letta Moynahan at [facility]  Joylene John, NP at [phone]  Enoxaparin, Home Use Enoxaparin (Lovenox) injection is a medication used to prevent clots from developing in your veins. Medications such as enoxaparin are called blood thinners or anticoagulants. If blood clots are untreated they could travel to your lungs. This is called a pulmonary embolus. A blood clot in your lungs can be fatal. Caregivers often use anticoagulants such as enoxaparin to prevent clots following surgery. It is also used along with aspirin when the heart is not getting enough blood. Continue the enoxaparin injections as directed by your caregiver. Your caregiver will use blood clotting  test results to decide when you can safely stop using enoxaparin injections. If your caregiver prescribes any additional anticoagulant, you must take it exactly as directed. RISKS AND COMPLICATIONS  If you have received recent epidural anesthesia, spinal anesthesia, or a spinal tap while receiving anticoagulants, you are at risk for developing a blood clot in or around the spine. This condition could result in long-term or permanent paralysis.  Because anticoagulants thin your blood, severe bleeding may occur from any tissue or organ. Symptoms of the blood being too thin may include:  Bleeding from the nose or gums that does not stop quickly.  Unusual bruising or bruising easily.  Swelling or pain at an injection site.  A cut that does not stop bleeding within 10 minutes.  Continual nausea for more than 1 day or vomiting blood.  Coughing up blood.  Blood in the urine which may appear as pink, red, or brown urine.  Blood in bowel movements which may appear as red, dark or black stools.  Sudden weakness or numbness of the face, arm, or leg, especially on one side of the body.  Sudden confusion.  Trouble speaking (aphasia) or understanding.  Sudden trouble seeing in one or both eyes.  Sudden trouble walking.  Dizziness.  Loss of balance or coordination.  Severe pain, such as a headache, joint pain, or back pain.  Fever.  Bruising around the injection sites may be expected.  Platelet drops, known as "thrombocytopenia," can occur with enoxaparin  use. A condition called "heparin-induced thrombocytopenia" has been seen. If you have had this condition, you should tell your caregiver. Your caregiver may direct you to have blood tests to monitor this condition.  Do not use if you have allergies to the medication, heparin, or pork products.  Other side effects may include mild local reactions or irritation at the site of injection, pain, bruising, and redness of skin. HOME CARE  INSTRUCTIONS You will be instructed by your caregiver how to give enoxaparin injections. 1. Before giving your medication you should make sure the injection is a clear and colorless or pale yellow solution. If your medication becomes discolored or has particles in the bottle, do not use and notify your caregiver. 2. When using the 30 and 40 mg pre-filled syringes, do not expel the air bubble from the syringe before the injection. This makes sure you use all the medication in the syringe. 3. The injections will be given subcutaneously. This means it is given into the fat over the belly (abdomen). It is given deep beneath the skin but not into the muscle. The shots should be injected around the abdominal wall. Change the sites of injection each time. The whole length of the needle should be introduced into a skin fold held between the thumb and forefinger; the skin fold should be held throughout the injection. Do not rub the injection site after completion of the injection. This increases bruising. Enoxaparin injection pre-filled syringes and graduated pre-filled syringes are available with a system that shields the needle after injection. 4. Inject by pushing the plunger to the bottom of the syringe. 5. Remove the syringe from the injection site keeping your finger on the plunger rod. Be careful not to stick yourself or others. 6. After injection and the syringe is empty, set off the safety system by firmly pushing the plunger rod. The protective sleeve will automatically cover the needle and you can hear a click. The click means your needle is safely covered. Do not try replacing the needle shield. 7. Get rid of the syringe in the nearest sharps container. 8. Keep your medication safely stored at room temperatures.  Due to the complications of anticoagulants, it is very important that you take your anticoagulant as directed by your caregiver. Anticoagulants need to be taken exactly as instructed. Be sure  you understand all your anticoagulant instructions.  Changes in medicines, supplements, diet, and illness can affect your anticoagulation therapy. Be sure to inform your caregivers of any of these changes.  While on anticoagulants, you will need to have blood tests done routinely as directed by your caregivers.  Be careful not to cut yourself when using sharp objects.  Limit physical activities or sports that could result in a fall or cause injury.  It is extremely important that you tell all of your caregivers and dentist that you are taking an anticoagulant, especially if you are injured or plan to have any type of procedure or operation.  Follow up with your laboratory test and caregiver appointments as directed. It is very important to keep your appointments. Not keeping appointments could result in a chronic or permanent injury, pain, or disability. SEEK MEDICAL CARE IF:  You develop any rashes.  You have any worsening of the condition for which you are receiving anticoagulation therapy. SEEK IMMEDIATE MEDICAL CARE IF:  Bleeding from the nose or gums does not stop quickly.  You have unusual bruising or are bruising easily.  Swelling or pain occurs at an injection  site.  A cut does not stop bleeding within 10 minutes.  You have continual nausea for more than 1 day or are vomiting blood.  You are coughing up blood.  You have blood in the urine.  You have dark or black stools.  You have sudden weakness or numbness of the face, arm, or leg, especially on one side of the body.  You have sudden confusion.  You have trouble speaking (aphasia) or understanding.  You have sudden trouble seeing in one or both eyes.  You have sudden trouble walking.  You have dizziness.  You have a loss of balance or coordination.  You have severe pain, such as a headache, joint pain, or back pain.  You have a serious fall or head injury, even if you are not bleeding.  You have an oral  temperature above 102 F (38.9 C), not controlled by medicine. ANY OF THESE SYMPTOMS MAY REPRESENT A SERIOUS PROBLEM THAT IS AN EMERGENCY. Do not wait to see if the symptoms will go away. Get medical help right away. Call your local emergency services (911 in [place] DO NOT drive yourself to the hospital. MAKE SURE YOU:  Understand these instructions.  Will watch your condition.  Will get help right away if you are not doing well or get worse. Document Released: 11/29/2003 Document Revised: 04/21/2011 Document Reviewed: 01/27/2005 [facility] Patient Information 2014 [place].   Oxycodone tablets or capsules What is this medicine? OXYCODONE (ox i KOE done) is a pain reliever. It is used to treat moderate to severe pain. This medicine may be used for other purposes; ask your health care provider or pharmacist if you have questions. COMMON BRAND NAME(S): Dazidox , Endocodone , OXECTA, OxyIR, Percolone, Roxicodone What should I tell my health care provider before I take this medicine? They need to know if you have any of these conditions: -Addison's disease -brain tumor -drug abuse or addiction -head injury -heart disease -if you frequently drink alcohol containing drinks -kidney disease or problems going to the bathroom -liver disease -lung disease, asthma, or breathing problems -mental problems -an unusual or allergic reaction to oxycodone, codeine, hydrocodone, morphine, other medicines, foods, dyes, or preservatives -pregnant or trying to get pregnant -breast-feeding How should I use this medicine? Take this medicine by mouth with a glass of water. Follow the directions on the prescription label. You can take it with or without food. If it upsets your stomach, take it with food. Take your medicine at regular intervals. Do not take it more often than directed. Do not stop taking except on your doctor's advice. Some brands of this medicine, like Oxecta, have special  instructions. Ask your doctor or pharmacist if these directions are for you: Do not cut, crush or chew this medicine. Swallow only one tablet at a time. Do not wet, soak, or lick the tablet before you take it. Talk to your pediatrician regarding the use of this medicine in children. Special care may be needed. Overdosage: If you think you have taken too much of this medicine contact a poison control center or emergency room at once. NOTE: This medicine is only for you. Do not share this medicine with others. What if I miss a dose? If you miss a dose, take it as soon as you can. If it is almost time for your next dose, take only that dose. Do not take double or extra doses. What may interact with this medicine? -alcohol -antihistamines -certain medicines used for nausea like chlorpromazine, droperidol -  erythromycin -ketoconazole -medicines for depression, anxiety, or psychotic disturbances -medicines for sleep -muscle relaxants -naloxone -naltrexone -narcotic medicines (opiates) for pain -nilotinib -phenobarbital -phenytoin -rifampin -ritonavir -voriconazole This list may not describe all possible interactions. Give your health care provider a list of all the medicines, herbs, non-prescription drugs, or dietary supplements you use. Also tell them if you smoke, drink alcohol, or use illegal drugs. Some items may interact with your medicine. What should I watch for while using this medicine? Tell your doctor or health care professional if your pain does not go away, if it gets worse, or if you have new or a different type of pain. You may develop tolerance to the medicine. Tolerance means that you will need a higher dose of the medicine for pain relief. Tolerance is normal and is expected if you take this medicine for a long time. Do not suddenly stop taking your medicine because you may develop a severe reaction. Your body becomes used to the medicine. This does NOT mean you are addicted.  Addiction is a behavior related to getting and using a drug for a non-medical reason. If you have pain, you have a medical reason to take pain medicine. Your doctor will tell you how much medicine to take. If your doctor wants you to stop the medicine, the dose will be slowly lowered over time to avoid any side effects. You may get drowsy or dizzy when you first start taking this medicine or change doses. Do not drive, use machinery, or do anything that may be dangerous until you know how the medicine affects you. Stand or sit up slowly. There are different types of narcotic medicines (opiates) for pain. If you take more than one type at the same time, you may have more side effects. Give your health care provider a list of all medicines you use. Your doctor will tell you how much medicine to take. Do not take more medicine than directed. Call emergency for help if you have problems breathing. This medicine will cause constipation. Try to have a bowel movement at least every 2 to 3 days. If you do not have a bowel movement for 3 days, call your doctor or health care professional. Your mouth may get dry. Drinking water, chewing sugarless gum, or sucking on hard candy may help. See your dentist every 6 months. What side effects may I notice from receiving this medicine? Side effects that you should report to your doctor or health care professional as soon as possible: -allergic reactions like skin rash, itching or hives, swelling of the face, lips, or tongue -breathing problems -confusion -feeling faint or lightheaded, falls -trouble passing urine or change in the amount of urine -unusually weak or tired Side effects that usually do not require medical attention (report to your doctor or health care professional if they continue or are bothersome): -constipation -dry mouth -itching -nausea, vomiting -upset stomach This list may not describe all possible side effects. Call your doctor for medical  advice about side effects. You may report side effects to FDA at 1-800-FDA-1088. Where should I keep my medicine? Keep out of the reach of children. This medicine can be abused. Keep your medicine in a safe place to protect it from theft. Do not share this medicine with anyone. Selling or giving away this medicine is dangerous and against the law. Store at room temperature between 15 and 30 degrees C (59 and 86 degrees F). Protect from light. Keep container tightly closed. This medicine may cause  accidental overdose and death if it is taken by other adults, children, or pets. Flush any unused medicine down the toilet to reduce the chance of harm. Do not use the medicine after the expiration date. NOTE: This sheet is a summary. It may not cover all possible information. If you have questions about this medicine, talk to your doctor, pharmacist, or health care provider.  2014, [employer]. (2012-10-07 13:43:33)

## 2013-03-07 ENCOUNTER — Ambulatory Visit: Payer: Medicare Other | Attending: Gynecologic Oncology | Admitting: Gynecologic Oncology

## 2013-03-07 ENCOUNTER — Encounter: Payer: Self-pay | Admitting: Gynecologic Oncology

## 2013-03-07 ENCOUNTER — Ambulatory Visit: Payer: Medicare Other

## 2013-03-07 VITALS — BP 145/79 | HR 90 | Temp 97.8°F | Resp 16 | Ht 62.44 in | Wt 134.7 lb

## 2013-03-07 DIAGNOSIS — C541 Malignant neoplasm of endometrium: Secondary | ICD-10-CM

## 2013-03-07 NOTE — Progress Notes (Signed)
Follow Up Note: Gyn-Onc  Ebony Herring 80 y.o. female  CC:  Chief Complaint  Patient presents with  . Routine Post Op    Follow up    HPI:  Ebony Herring is a 79 year old seen in consultation at the request of Dr. Gaetano Net regarding the management of a newly diagnosed high-grade endometrial carcinoma. The patient's history dates back to approximately November when she developed of vaginal discharge. Overtime some blood was noted in the discharge. Ultimately the patient was referred to Dr. Gertie Fey for further evaluation. An endometrial biopsy was obtained on 01/26/2013 showed a high-grade endometrial carcinoma with serous features. Subsequently the patient's had an MRI of the pelvis showing a fibroid uterus and prominent bilateral iliac lymph nodes measuring between 1 cm and 7 mm.  Past gynecologic history includes a bilateral tubal ligation. Obstetrical history gravida 2.    On 03/01/13, she underwent a total abdominal hysterectomy, bilateral salpingo-oophorectomy, pelvic and para-aortic lymphadenectomy by Dr. Fermin Schwab.  Her post-operative course was uneventful.  Final pathology revealed:  1. Uterus, ovaries and fallopian tubes, with cervix - INVASIVE HIGH GRADE SEROUS CARCINOMA WITH ASSOCIATED EXTENSIVE LYMPHATIC INVASION, EXTENDING TO OUTER HALF OF THE MYOMETRIUM. - RIGHT OVARY: METASTATIC SEROUS CARCINOMA WITH EXTENSIVE ANGIOLYMPHATIC INVASION PRESENT.  - MYOMETRIUM: LEIOMYOMATA AND ADENOMYOSIS.  - CERVIX: BENIGN SQUAMOUS MUCOSA AND ENDOCERVICAL MUCOSA, NO DYSPLASIA OR MALIGNANCY.  - LEFT OVARY AND FALLOPIAN TUBES: BENIGN OVARIAN TISSUE AND BILATERAL FALLOPIAN TUBAL TISSUE, NO EVIDENCE OF MALIGNANCY. 2. Lymph nodes, regional resection, left pelvic - ONE OF FIVE LYMPH NODES, POSITIVE FOR METASTATIC CARCINOMA (1/5). 3. Lymph nodes, regional resection, right pelvic - ONE OF FOUR LYMPH NODES, POSITIVE FOR METASTATIC CARCINOMA (1/4) 4. Lymph nodes, regional resection, aortic - THREE OF FOUR LYMPH  NODES, POSITIVE FOR METASTATIC CARCINOMA(3/4)  Interval History:  She presents today for post-operative follow up and staple removal.  She reports doing well except for continued mild nausea and a decreased appetite.  On Saturday evening, she took 2 correctol tablets and experienced diarrhea throughout the evening.  Passing flatus.  Ambulating without difficulty.  Mild soreness of the abdomen reported.  Denies vaginal bleeding.  No other concerns voiced.    Review of Systems  Constitutional: Feels well.  No fever, chills, early satiety.  Positive for decreased appetite.    Cardiovascular: No chest pain, shortness of breath, or edema.  Pulmonary: No cough or wheeze.  Gastrointestinal: No nausea, vomiting, or diarrhea. No bright red blood per rectum or change in bowel movement.  Genitourinary: No frequency, urgency, or dysuria. No vaginal bleeding or discharge.  Musculoskeletal: No myalgia or joint pain. Neurologic: No weakness, numbness, or change in gait.  Psychology: No depression, anxiety, or insomnia.  Current Meds:  Outpatient Encounter Prescriptions as of 03/07/2013  Medication Sig  . Brinzolamide-Brimonidine (SIMBRINZA) 1-0.2 % SUSP Place 1 drop into both eyes 2 (two) times daily.   Marland Kitchen enoxaparin (LOVENOX) 40 MG/0.4ML injection Inject 0.4 mLs (40 mg total) into the skin daily.  . fish oil-omega-3 fatty acids 1000 MG capsule Take 1 g by mouth 2 (two) times daily.   Marland Kitchen oxyCODONE (OXY IR/ROXICODONE) 5 MG immediate release tablet Take 1 tablet (5 mg total) by mouth every 4 (four) hours as needed for moderate pain or severe pain.    Allergy:  Allergies  Allergen Reactions  . Bactrim [Sulfamethoxazole-Trimethoprim] Other (See Comments)    Made her see things  . Ciprofloxacin Other (See Comments)    Made her see things  Social Hx:   History   Social History  . Marital Status: Married    Spouse Name: N/A    Number of Children: N/A  . Years of Education: N/A   Occupational  History  . Not on file.   Social History Main Topics  . Smoking status: Never Smoker   . Smokeless tobacco: Never Used  . Alcohol Use: No  . Drug Use: No  . Sexual Activity: Not on file   Other Topics Concern  . Not on file   Social History Narrative  . No narrative on file    Past Surgical Hx:  Past Surgical History  Procedure Laterality Date  . Cholecystectomy    . Appendectomy    . Abdominal hysterectomy N/A 03/01/2013    Procedure: HYSTERECTOMY ABDOMINAL TOTAL/BILATERAL SALPINGO OOPHORECTOMY/WITH PELVIC AND PARA AORTIC LYMPHADNECTOMY ;  Surgeon: Alvino Chapel, MD;  Location: [facility];  Service: Gynecology;  Laterality: N/A;  . Salpingoophorectomy Bilateral 03/01/2013    Procedure: BILATERAL SALPINGO OOPHORECTOMY;  Surgeon: Alvino Chapel, MD;  Location: [facility];  Service: Gynecology;  Laterality: Bilateral;    Past Medical Hx:  Past Medical History  Diagnosis Date  . Glaucoma     High pressure- ?pre glaucoma  . Cancer     ENDOMETRIAL CANCER    Family Hx: History reviewed. No pertinent family history.  Vitals:  Blood pressure 145/79, pulse 90, temperature 97.8 F (36.6 C), resp. rate 16, height 5' 2.44" (1.586 m), weight 134 lb 11.2 oz (61.1 kg).  Physical Exam:  General: Well developed, well nourished female in no acute distress. Alert and oriented x 3.  Cardiovascular: Regular rate and rhythm. S1 and S2 normal.  Lungs: Clear to auscultation bilaterally. No wheezes/crackles/rhonchi noted.  Skin: No rashes or lesions present. Back: No CVA tenderness.  Abdomen: Abdomen soft, non-tender and non-obese. Mildly tympanic on percussion.  Active bowel sounds in all quadrants. No evidence of a fluid wave or abdominal masses.  27 staples removed from midline incision without difficulty.  Mild ecchymosis noted around the upper aspect of the incision.  1/2 cm opening developed after removal of the staples from the top of the incision with 1/4 cm depth.  No signs  of infection.  Minimal amount of drainage.  Fascia intact.  Tip of a 2x2 gauze dampened with normal saline and slightly packed into the opening.  Dry dressing applied.  Patient and husband instructed on care.   Extremities: No bilateral cyanosis, edema, or clubbing.   Assessment/Plan:  80 year old s/p TAH, BSO, pelvic and para-aortic lymphadenectomy by Dr. 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  Final pathology discussed with the patient and her husband by Dr. Fermin Schwab.  Recommendations for additional treatment in 'sandwich' fashion with chemotherapy under the care of Dr. Marko Plume to begin three to four weeks post-operatively, followed by radiation under the care of Dr. Sondra Come, with three cycles of chemotherapy to follow.  Verbalizing understanding and agreeable to proceeding with further treatment.  Incision care discussed and supplies given.  Information on the BRAT diet given to the patient with reportable signs and symptoms reviewed.  She is to call if her nausea persists or worsens over the next three days.  Another urine sample was obtained today to follow up on a pre-op urine that was positive for strep.  She is to follow up with Dr. Fermin Schwab in six weeks for a post-operative check up and after her first three  cycles of chemotherapy.  She is to call the office for any questions or concerns.    Deannah Rossi DEAL, NP 03/07/2013, 3:30 PM

## 2013-03-07 NOTE — Patient Instructions (Addendum)
Plan to follow up with Dr. Fermin Schwab on Feb 27.  You will receive a phone call from the [facility] to arrange for an appointment with Dr. Evlyn Clines for chemotherapy.  You will also be seeing Dr. Gery Pray in Radiation.   Please call if the nausea persists or worsens over the next week.

## 2013-03-08 ENCOUNTER — Ambulatory Visit: Payer: Medicare Other | Admitting: Gynecologic Oncology

## 2013-03-08 ENCOUNTER — Telehealth: Payer: Self-pay | Admitting: Oncology

## 2013-03-08 LAB — URINE CULTURE

## 2013-03-08 NOTE — Telephone Encounter (Signed)
C/D 03/08/13 for appt 03/14/13 °

## 2013-03-08 NOTE — Telephone Encounter (Signed)
Patient called to scheduled new appointment 02/02 @ 11 w/Dr. Marko Plume, chemo edu 01/30 @ 9:30.  All appointment confirmed.

## 2013-03-09 ENCOUNTER — Other Ambulatory Visit: Payer: Self-pay | Admitting: Gynecologic Oncology

## 2013-03-09 ENCOUNTER — Telehealth: Payer: Self-pay | Admitting: *Deleted

## 2013-03-09 DIAGNOSIS — N390 Urinary tract infection, site not specified: Secondary | ICD-10-CM

## 2013-03-09 MED ORDER — AMOXICILLIN 500 MG PO CAPS: 500.0000 mg | ORAL_CAPSULE | Freq: Two times a day (BID) | ORAL | Status: DC

## 2013-03-09 NOTE — Telephone Encounter (Signed)
Call to pt with Urine Culture Results- pt to continue taking Amoxicillin x 7days, rx sent to pt's pharmacy. Pt verbalized understanding, encouraged pt to continue drinking fluids.

## 2013-03-11 ENCOUNTER — Other Ambulatory Visit: Payer: Medicare Other

## 2013-03-11 NOTE — Progress Notes (Signed)
GYN Location of Tumor / Histology: high-grade endometrial carcinoma  Patient presented with symptoms of: increased vaginal discharge and bleeding  Biopsies of revealed:   Diagnosis 1. Uterus, ovaries and fallopian tubes, with cervix - INVASIVE HIGH GRADE SEROUS CARCINOMA WITH ASSOCIATED EXTENSIVE LYMPHATIC INVASION, EXTENDING TO OUTER HALF OF THE MYOMETRIUM. - RIGHT OVARY: METASTATIC SEROUS CARCINOMA WITH EXTENSIVE ANGIOLYMPHATIC INVASION PRESENT. - MYOMETRIUM: LEIOMYOMATA AND ADENOMYOSIS. - CERVIX: BENIGN SQUAMOUS MUCOSA AND ENDOCERVICAL MUCOSA, NO DYSPLASIA OR MALIGNANCY. - LEFT OVARY AND FALLOPIAN TUBES: BENIGN OVARIAN TISSUE AND BILATERAL FALLOPIAN TUBAL TISSUE, NO EVIDENCE OF MALIGNANCY. 2. Lymph nodes, regional resection, left pelvic - ONE OF FIVE LYMPH NODES, POSITIVE FOR METASTATIC CARCINOMA (1/5). 3. Lymph nodes, regional resection, right pelvic - ONE OF FOUR LYMPH NODES, POSITIVE FOR METASTATIC CARCINOMA (1/4) 4. Lymph nodes, regional resection, aortic - THREE OF FOUR LYMPH NODES, POSITIVE FOR METASTATIC CARCINOMA(3/4)  Past/Anticipated interventions by Gyn/Onc surgery, if any: 03/01/13 - Procedure: HYSTERECTOMY ABDOMINAL TOTAL/BILATERAL SALPINGO OOPHORECTOMY/WITH PELVIC AND PARA AORTIC LYMPHADNECTOMY ;  Surgeon: Alvino Chapel, MD;  Location: [facility];  Service: Gynecology;  Laterality: N/A; and Procedure: BILATERAL SALPINGO OOPHORECTOMY;  Surgeon: Alvino Chapel, MD;  Location: [facility];  Service: Gynecology;  Laterality: Bilateral  Past/Anticipated interventions by medical oncology, if any: Recommendations for additional treatment in 'sandwich' fashion with chemotherapy under the care of Dr. Marko Plume to begin three to four weeks post-operatively, tentatively on 2/16, followed by radiation under the care of Dr. Sondra Come, with three cycles of chemotherapy to follow.  Weight changes, if any: no  Bowel/Bladder complaints, if any: currenlty on Keflex for UTI    Nausea/Vomiting, if any: no  Pain issues, if any:  no  SAFETY ISSUES:  Prior radiation? no  Pacemaker/ICD? no  Possible current pregnancy? no  Is the patient on methotrexate? no  Current Complaints / other details:  Here with her husband.  Has 1 son and had another son die in a car accident at 88.  On Keflex for UTI and also one area of her incision has not closed.

## 2013-03-12 ENCOUNTER — Other Ambulatory Visit: Payer: Self-pay | Admitting: Oncology

## 2013-03-12 DIAGNOSIS — C541 Malignant neoplasm of endometrium: Secondary | ICD-10-CM

## 2013-03-14 ENCOUNTER — Ambulatory Visit (HOSPITAL_BASED_OUTPATIENT_CLINIC_OR_DEPARTMENT_OTHER): Payer: Medicare Other | Admitting: Oncology

## 2013-03-14 ENCOUNTER — Encounter: Payer: Self-pay | Admitting: Oncology

## 2013-03-14 ENCOUNTER — Telehealth: Payer: Self-pay | Admitting: *Deleted

## 2013-03-14 ENCOUNTER — Ambulatory Visit: Payer: Medicare Other

## 2013-03-14 ENCOUNTER — Telehealth: Payer: Self-pay | Admitting: Oncology

## 2013-03-14 ENCOUNTER — Other Ambulatory Visit (HOSPITAL_BASED_OUTPATIENT_CLINIC_OR_DEPARTMENT_OTHER): Payer: Medicare Other

## 2013-03-14 VITALS — BP 133/77 | HR 92 | Temp 99.0°F | Resp 18 | Ht 62.44 in | Wt 137.2 lb

## 2013-03-14 DIAGNOSIS — C541 Malignant neoplasm of endometrium: Secondary | ICD-10-CM

## 2013-03-14 DIAGNOSIS — C549 Malignant neoplasm of corpus uteri, unspecified: Secondary | ICD-10-CM

## 2013-03-14 DIAGNOSIS — N390 Urinary tract infection, site not specified: Secondary | ICD-10-CM

## 2013-03-14 DIAGNOSIS — H409 Unspecified glaucoma: Secondary | ICD-10-CM

## 2013-03-14 LAB — CBC WITH DIFFERENTIAL/PLATELET
BASO%: 1.2 % (ref 0.0–2.0)
Basophils Absolute: 0.1 10*3/uL (ref 0.0–0.1)
EOS ABS: 0.2 10*3/uL (ref 0.0–0.5)
EOS%: 1.9 % (ref 0.0–7.0)
HCT: 37.4 % (ref 34.8–46.6)
HGB: 12.4 g/dL (ref 11.6–15.9)
LYMPH%: 25.7 % (ref 14.0–49.7)
MCH: 30.1 pg (ref 25.1–34.0)
MCHC: 33.1 g/dL (ref 31.5–36.0)
MCV: 91.1 fL (ref 79.5–101.0)
MONO#: 0.8 10*3/uL (ref 0.1–0.9)
MONO%: 8.6 % (ref 0.0–14.0)
NEUT%: 62.6 % (ref 38.4–76.8)
NEUTROS ABS: 5.7 10*3/uL (ref 1.5–6.5)
PLATELETS: 491 10*3/uL — AB (ref 145–400)
RBC: 4.11 10*6/uL (ref 3.70–5.45)
RDW: 13.4 % (ref 11.2–14.5)
WBC: 9.2 10*3/uL (ref 3.9–10.3)
lymph#: 2.4 10*3/uL (ref 0.9–3.3)

## 2013-03-14 LAB — COMPREHENSIVE METABOLIC PANEL (CC13)
ALT: 22 U/L (ref 0–55)
ANION GAP: 9 meq/L (ref 3–11)
AST: 23 U/L (ref 5–34)
Albumin: 3.2 g/dL — ABNORMAL LOW (ref 3.5–5.0)
Alkaline Phosphatase: 74 U/L (ref 40–150)
BILIRUBIN TOTAL: 0.44 mg/dL (ref 0.20–1.20)
BUN: 11.8 mg/dL (ref 7.0–26.0)
CO2: 25 meq/L (ref 22–29)
CREATININE: 0.8 mg/dL (ref 0.6–1.1)
Calcium: 9.3 mg/dL (ref 8.4–10.4)
Chloride: 103 mEq/L (ref 98–109)
GLUCOSE: 100 mg/dL (ref 70–140)
Potassium: 4.3 mEq/L (ref 3.5–5.1)
Sodium: 137 mEq/L (ref 136–145)
Total Protein: 6.2 g/dL — ABNORMAL LOW (ref 6.4–8.3)

## 2013-03-14 MED ORDER — LORAZEPAM 0.5 MG PO TABS: 0.5000 mg | ORAL_TABLET | Freq: Four times a day (QID) | ORAL | Status: DC | PRN

## 2013-03-14 MED ORDER — DEXAMETHASONE 4 MG PO TABS: ORAL_TABLET | ORAL | Status: DC

## 2013-03-14 MED ORDER — ONDANSETRON HCL 8 MG PO TABS: 8.0000 mg | ORAL_TABLET | Freq: Two times a day (BID) | ORAL | Status: DC | PRN

## 2013-03-14 MED ORDER — LORAZEPAM 0.5 MG PO TABS: ORAL_TABLET | ORAL | Status: DC

## 2013-03-14 MED ORDER — ONDANSETRON HCL 8 MG PO TABS: 8.0000 mg | ORAL_TABLET | Freq: Two times a day (BID) | ORAL | Status: DC

## 2013-03-14 MED ORDER — CEPHALEXIN 500 MG PO CAPS: 500.0000 mg | ORAL_CAPSULE | Freq: Four times a day (QID) | ORAL | Status: DC

## 2013-03-14 NOTE — Telephone Encounter (Signed)
, °

## 2013-03-14 NOTE — Patient Instructions (Signed)
If surgery area more red, more tender, any drainage or bleeding, or if any fever, call Dr Josephina Shih [facility] office during day [phone] to speak with Joylene John, or [facility] after office hours/ weekend [phone]  Fine to use stool softener Colace (docusate sodium) 100 mg once or twice daily if needed for bowels  We will change antibiotic to Keflex (cephalexin) 500 mg 4x daily, instead of amoxicillin. Start today, for the wound and bladder infection.

## 2013-03-14 NOTE — Progress Notes (Signed)
[facility] NEW PATIENT EVALUATION   Name: Ebony Herring Date: 03/14/2013 MRN: [id number] DOB: [birth year]  REFERRING PHYSICIAN: Deedra Ehrich ZJ:IRCVEL, Modena Nunnery (PCP [facility]), Gery Pray, Everlene Farrier, Donato Heinz (ophth)   REASON FOR REFERRAL: recent diagnosis endometrial cancer, for adjuvant chemotherapy   HISTORY OF PRESENT ILLNESS:Ebony Herring is a 80 y.o. 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 The chemotherapy would be given in sandwich fashion with radiation by Dr Sondra Come; due to wound healing concerns, chemotherapy is anticipated to begin 3-4 weeks post operatively (= Feb 10-17). [facility] care has been at [facility], tho patient has not had many medical concerns prior and seems to have had mostly prn acute care previously. She presented with vaginal discharge with some bleeding, since Nov 2014. She was referred from Dr Buelah Manis to Dr Gaetano Net, with endometrial biopsy 01-26-2013 showing high grade endometrial carcinoma with serous features. MRI pelvis 01-20-13  Had uterine mass and prominent bilateral iliac nodes. She had TAH BSO and pelvic and para-aortic lymphadenectomy by Dr Josephina Shih on 03-01-2013. Pathology [phone]) had high grade serous carcinoma of endometrium extending to outer half of myometrium, with extensive angiolymphatic invasion, involvement of right ovary, involvement of 2/9 pelvic nodes and 3/4 para-aortic nodes. At completion of surgery there was no gross residual disease. She was transfused 2 units of PRBCs on 03-05-13. She had small opening in upper portion of wound after staples removed on 03-07-13, which is being packed; she has had increased erythema of lower portion of wound since then, despite being on amoxicillin  for E.coli UTI on urine culture sent 03-07-13. She has not had fever or drainage from the wound.  She has not used any pain medication since discharge/ did not fill that prescription. She continues lovenox injections daily, total 30, which she is administering herself as husband was not comfortable doing this; she has had no bleeding. Constipation has improved with prunes, appetite is getting better, weight is ~ at usual. She denies dysuria or flank pain.  She and husband attended chemotherapy education class prior to this visit. She is to see Dr Sondra Come in consultation upcoming.    REVIEW OF SYSTEMS as above, also: She has been walking 30 min daily. No HA, no dental problems/ dentist is Dr Lin Landsman. Occasional sinus congestion. No hearing difficulty. No thyroid symptoms. No respiratory or cardiac symptoms. No noted changes in breasts, has not had mammograms "in years". Usual weight ~ 135 lbs. No GERD. No nausea now. No arthritis. No LE swelling.   Remainder of full 10 point review of systems negative.   ALLERGIES: Bactrim and Ciprofloxacin  PAST MEDICAL/ SURGICAL HISTORY:    G2 BTL Glaucoma, followed by Dr Donato Heinz Cholecystectomy appendectomy No colonoscopy No mammograms in years  CURRENT MEDICATIONS: reviewed as listed now in EMR. Antibiotic is changed to Keflex 500 mg qid instead of present amoxicillin. She will continue post op lovenox as instructed. She had not had flu vaccine this year, agreed to having this which was done by RN today. She has not had pneumovax.  PHARMACY [facility]   SOCIAL HISTORY:  She and husband both from [place], where they still live. Married x 62 years. 1 son died in MVA age 61, 77 son lives next door/ never married/ no grandchildren. Never smoker, no ETOH.  Husband farms tobacco. Patient worked various jobs x 40 years.  FAMILY HISTORY:  Mother dementia, no cancer Father died of PUD 4 maternal aunts with breast cancer Brother with heart  disease Son with nephrolithiasis and gout Paternal grandmother with throat cancer age 74, nonsmoker          PHYSICAL EXAM:  height is 5' 2.44" (1.586 m) and weight is 137 lb 3.2 oz (62.234 kg). Her oral temperature is 99 F (37.2 C). Her blood pressure is 133/77 and her pulse is 92. Her respiration is 18.  Alert, pleasant, cooperative older lady, fair historian. Ambulatory without assistance. Husband present for all of visit other than exam.   HEENT: normal hair pattern. PERRL, not icteric. Oral mucosa moist and clear, posterior pharynx likewise. No obvious dental problems. Neck supple without JVD or thyroid mass.   RESPIRATORY: not labored RA. Lungs clear to auscultation and percussion anteriorly and posteriorly.  CARDIAC/ VASCULAR:RRR, no murmur or gallop, clear heart sounds.  ABDOMEN: Soft, not distended, some bowel sounds normally active, no appreciable HSM or mass. Midline surgical incision with <0.5 cm open track superiorly with packing, no significant erythema around that area. Lower 1/4 of incision has moderate erythema extending out bilaterally and below incision 3-4 cm, not hot, slightly tender, no drainage.  LYMPH NODES: no cervical, supraclavicular, axillary or inguinal adenopathy  BREASTS: bilaterally without dominant mass, skin or nipple findings. Axillae benign, no swelling bilateral UE.  NEUROLOGIC: speech fluent and appropriate. CN, motor, sensory, cerebellar without focal deficits. Psych: mood and affect appropriate.  SKIN: Erythema around abdominal incision as noted, otherwise no rash, ecchymoses, petechiae.  MUSCULOSKELETAL: no edema, cords or tenderness LE. No clubbing or cyanosis. Back not tender.  At time of my exam, wound was also evaluated by NP Joylene John, who had seen patient on 03-07-13. Area of erythema at lower aspect of incision is more prominent than at her exam last week.  LABORATORY DATA:  Results for orders placed in visit on 03/14/13 (from the  past 48 hour(s))  CBC WITH DIFFERENTIAL     Status: Abnormal   Collection Time    03/14/13 10:32 AM      Result Value Range   WBC 9.2  3.9 - 10.3 10e3/uL   NEUT# 5.7  1.5 - 6.5 10e3/uL   HGB 12.4  11.6 - 15.9 g/dL   HCT 37.4  34.8 - 46.6 %   Platelets 491 (*) 145 - 400 10e3/uL   MCV 91.1  79.5 - 101.0 fL   MCH 30.1  25.1 - 34.0 pg   MCHC 33.1  31.5 - 36.0 g/dL   RBC 4.11  3.70 - 5.45 10e6/uL   RDW 13.4  11.2 - 14.5 %   lymph# 2.4  0.9 - 3.3 10e3/uL   MONO# 0.8  0.1 - 0.9 10e3/uL   Eosinophils Absolute 0.2  0.0 - 0.5 10e3/uL   Basophils Absolute 0.1  0.0 - 0.1 10e3/uL   NEUT% 62.6  38.4 - 76.8 %   LYMPH% 25.7  14.0 - 49.7 %   MONO% 8.6  0.0 - 14.0 %   EOS% 1.9  0.0 - 7.0 %   BASO% 1.2  0.0 - 2.0 %  COMPREHENSIVE METABOLIC PANEL (0000000)     Status: Abnormal   Collection Time    03/14/13 10:32 AM      Result Value Range   Sodium 137  136 - 145 mEq/L   Potassium 4.3  3.5 - 5.1 mEq/L   Chloride 103  98 - 109 mEq/L   CO2 25  22 - 29 mEq/L   Glucose 100  70 - 140 mg/dl   BUN 11.8  7.0 - 26.0 mg/dL   Creatinine 0.8  0.6 - 1.1 mg/dL   Total Bilirubin 0.44  0.20 - 1.20 mg/dL   Alkaline Phosphatase 74  40 - 150 U/L   AST 23  5 - 34 U/L   ALT 22  0 - 55 U/L   Total Protein 6.2 (*) 6.4 - 8.3 g/dL   Albumin 3.2 (*) 3.5 - 5.0 g/dL   Calcium 9.3  8.4 - 10.4 mg/dL   Anion Gap 9  3 - 11 mEq/L     Urine culture from 03-07-13 had >100,000 E coli pansensitive, including ampicillin and cephalosporins. Urine culture preop 02-25-13 had >100.000 Viridans strep We will repeat urine culture with next visit at this office.  PATHOLOGY:  Collected: 03/01/2013  Accession: [id number]  REDiagnosis 1. Uterus, ovaries and fallopian tubes, with cervix - INVASIVE HIGH GRADE SEROUS CARCINOMA WITH ASSOCIATED EXTENSIVE LYMPHATIC INVASION, EXTENDING TO OUTER HALF OF THE MYOMETRIUM. - RIGHT OVARY: METASTATIC SEROUS CARCINOMA WITH EXTENSIVE ANGIOLYMPHATIC INVASION PRESENT. - MYOMETRIUM: LEIOMYOMATA AND  ADENOMYOSIS. - CERVIX: BENIGN SQUAMOUS MUCOSA AND ENDOCERVICAL MUCOSA, NO DYSPLASIA OR MALIGNANCY. - LEFT OVARY AND FALLOPIAN TUBES: BENIGN OVARIAN TISSUE AND BILATERAL FALLOPIAN TUBAL TISSUE, NO EVIDENCE OF MALIGNANCY. 2. Lymph nodes, regional resection, left pelvic - ONE OF FIVE LYMPH NODES, POSITIVE FOR METASTATIC CARCINOMA (1/5). 3. Lymph nodes, regional resection, right pelvic - ONE OF FOUR LYMPH NODES, POSITIVE FOR METASTATIC CARCINOMA (1/4) 4. Lymph nodes, regional resection, aortic - THREE OF FOUR LYMPH NODES, POSITIVE FOR METASTATIC CARCINOMA(3/4) Microscopic Comment 1. ONCOLOGY TABLE-UTERUS, CARCINOMA Specimen: Uterus, cervix, bilateral ovaries and fallopian tubes Procedure: Total hysterectomy and bilateral salpingo-oophorectomy Lymph node sampling performed: Yes Specimen integrity: Intact Maximum tumor size: 6.0 cm, gross measurement Histologic type: Invasive serous carcinoma Grade: High grade Myometrial invasion: 1.6 cm where myometrium is 2.0 cm in thickness Cervical stromal involvement: Not identified Extent of involvement of other organs: Right ovary with extensive angiolymphatic invasion present. Lymph - vascular invasion: Present Peritoneal washings: Negative ([id number]) Lymph nodes: # examined - 13; # positive 5 Pelvic lymph nodes: 2 involved of 9 lymph nodes.) Para-aortic lymph nodes: 3 involved of 4 lymph nodes. Other (specify involvement and site): N/A TNM code: pT3a, pN2 FIGO Stage (based on pathologic findings, needs clinical correlation): IIIC2    RADIOGRAPHY: MRI PELVIS WITHOUT AND WITH CONTRAST 01-20-2013  COMPARISON: None.  FINDINGS:  There is a fibroid uterus measuring 7.1 x 6.9 by 9 cm. There is a  mildly T2 hyperintense mass which appears to be distending the  distal uterine cavity. This measures 3.9 by 2.8 by 3.5 cm. This  appears to extend from the uterine cavity into the myometrium, image  54/series 903. Compared with the adjacent uterine  fibroids this  demonstrates more heterogeneous and hypoenhancement.  Prominent of bilateral iliac lymph nodes are identified. Left common  iliac node measures 7 mm, image number 1/series 11. There is a right  internal iliac lymph node which measures 1 cm, image 16/series 11.  No free fluid identified within the pelvis. The urinary bladder  appears normal.  IMPRESSION:  1. Findings are concerning for invasive tumor arising from the  endometrium and extending into the fundal myometrium. Cannot rule  out endometrial carcinoma. Tissue sampling is advised.  2. Prominent bilateral iliac lymph nodes.  3. Fibroid uterus.   CHEST 2 VIEW  02-25-2013 COMPARISON: PA and lateral chest x-ray of June 08, 2011  FINDINGS:  The lungs are adequately inflated. There is no focal infiltrate.  There is linear density that projects just above the midportion of  the right hemidiaphragm which is stable. This likely reflects  anterior pleural reflection on the lateral film. There is a  moderate-sized hiatal hernia. The cardiac silhouette is normal in  size. The mediastinum is normal in width. There is no pleural  effusion. The observed portions of the bony thorax appear normal.  The gas pattern within the upper abdomen is normal.  IMPRESSION:  There is no evidence of pneumonia nor CHF nor other acute  cardiopulmonary abnormality. There are no findings suspicious for  metastatic disease    DISCUSSION: We have discussed circumstances of her presentation and diagnosis, the surgery and pathology findings. We have discussed rationale for adjuvant chemotherapy; she is aware of recommendation for radiation also. We have discussed general schedule for outpatient chemotherapy and follow up labs and MD visits, antiemetics, steroids prior to taxol, and possible taxol aches or peripheral neuropathy. We have discussed drops in blood counts, hair loss, fatigue. Peripheral IV access is likely adequate at least to begin  treatment. We would like wound to have improved prior to start of treatment. Note as discussion progressed, patient did not seem to have as clear an understanding of situation as I had initially thought; for this reason and because of the wound concerns, I will see her back at least on 2-13 prior to first chemotherapy 2-16 if stable for that. She is to see Dr Josephina Shih again on 04-08-13. I have told patient to look at wound in mirror at home today and over next several days regularly, and asked husband to watch it also. They are instructed to call gyn oncology if wound does not progressively improve, or if fever or other infection problems. Note Keflex will also cover E coli in recent urine culture.     IMPRESSION / PLAN:  1.IIIC2 high grade serous endometrial carcinoma in 80 yo lady with no other major medical problems: post TAH BSO pelvic and para-aortic node evaluation by Dr Josephina Shih 03-01-2013. Plan q 3 week taxol carboplatin beginning ~ 03-28-13, given in sandwich fashion with RT after cycle 3 if Dr Sondra Come agrees. 2.New erythema of lower portion of abdominal wound, with open tract superiorly: change antibiotic to Keflex, continue packing upper portion, and follow. Note she is to see Dr Sondra Come on 2-4 and may need gyn onc to look at wound while she is at office that day if concerns. 3. Flu vaccine given today 4. Glaucoma 5.no mammograms in years. No colonoscopy. We will try to address these when possible, with priority at this point being recovery from surgery and initiation of therapy for the endometrial cancer. 6.E.coli UTI post op; Viridans strep in urine preop. Treatment as above 7.prophylactic lovenox continuing, 30 day course.   Patient and husband have had questions answered to their satisfaction and are in agreement with plan above. They can contact this office for questions or concerns at any time prior to next scheduled visit.    Time spent 55 min , including >50% discussion and  coordination of care.    Kaya Pottenger P, MD 03/14/2013 12:57 PM

## 2013-03-14 NOTE — Progress Notes (Signed)
Checked in new pt with no financial concerns. °

## 2013-03-14 NOTE — Telephone Encounter (Signed)
Per staff message and POF I have scheduled appts.  JMW  

## 2013-03-16 ENCOUNTER — Encounter: Payer: Self-pay | Admitting: Radiation Oncology

## 2013-03-16 ENCOUNTER — Ambulatory Visit
Admission: RE | Admit: 2013-03-16 | Discharge: 2013-03-16 | Disposition: A | Discharge status: Home / Self Care | Payer: Medicare Other | Source: Ambulatory Visit | Attending: Radiation Oncology | Admitting: Radiation Oncology

## 2013-03-16 VITALS — BP 142/74 | HR 88 | Temp 97.6°F | Ht 62.75 in | Wt 137.4 lb

## 2013-03-16 DIAGNOSIS — C541 Malignant neoplasm of endometrium: Secondary | ICD-10-CM

## 2013-03-16 DIAGNOSIS — C778 Secondary and unspecified malignant neoplasm of lymph nodes of multiple regions: Secondary | ICD-10-CM | POA: Insufficient documentation

## 2013-03-16 DIAGNOSIS — Z9089 Acquired absence of other organs: Secondary | ICD-10-CM | POA: Insufficient documentation

## 2013-03-16 DIAGNOSIS — Z9079 Acquired absence of other genital organ(s): Secondary | ICD-10-CM | POA: Insufficient documentation

## 2013-03-16 DIAGNOSIS — C549 Malignant neoplasm of corpus uteri, unspecified: Secondary | ICD-10-CM | POA: Insufficient documentation

## 2013-03-16 DIAGNOSIS — K449 Diaphragmatic hernia without obstruction or gangrene: Secondary | ICD-10-CM | POA: Insufficient documentation

## 2013-03-16 DIAGNOSIS — H409 Unspecified glaucoma: Secondary | ICD-10-CM | POA: Insufficient documentation

## 2013-03-16 DIAGNOSIS — Z803 Family history of malignant neoplasm of breast: Secondary | ICD-10-CM | POA: Insufficient documentation

## 2013-03-16 DIAGNOSIS — Z79899 Other long term (current) drug therapy: Secondary | ICD-10-CM | POA: Insufficient documentation

## 2013-03-16 DIAGNOSIS — Z90710 Acquired absence of both cervix and uterus: Secondary | ICD-10-CM | POA: Insufficient documentation

## 2013-03-16 DIAGNOSIS — M7989 Other specified soft tissue disorders: Secondary | ICD-10-CM | POA: Insufficient documentation

## 2013-03-16 DIAGNOSIS — K5900 Constipation, unspecified: Secondary | ICD-10-CM | POA: Insufficient documentation

## 2013-03-16 NOTE — Progress Notes (Signed)
Please see the Nurse Progress Note in the MD Initial Consult Encounter for this patient. 

## 2013-03-16 NOTE — Addendum Note (Signed)
Encounter addended by: Blair Promise, MD on: 03/16/2013  7:09 PM<BR>     Documentation filed: Clinical Notes

## 2013-03-16 NOTE — Progress Notes (Signed)
Radiation Oncology         [phone]) [phone] ________________________________  Initial outpatient Consultation  Name: Ebony Herring MRN: [id number]  Date: 03/16/2013  DOB: [birth year]  [id number], Lonell Grandchild, MD  Dorothyann Gibbs, NP , Marti Sleigh, MD, Evlyn Clines, MD  REFERRING PHYSICIAN: Dorothyann Gibbs, NP  DIAGNOSIS: Endometrial cancer - high-grade serous carcinoma   Primary site: Corpus Uteri - Carcinoma   Staging method: AJCC 7th Edition   Clinical: Stage IIIC2 (T3a, N2, M0)   Pathologic: Stage IIIC2 (T3a, N2, M0)   Summary: Stage IIIC2 (T3a, N2, M0)   HISTORY OF PRESENT ILLNESS::Ebony Herring is a 80 y.o. female who is seen out courtesy of Dr. Threasa Heads for an opinion concerning radiation therapy as part of management of patient's recently diagnosed advanced endometrial cancer.The patient's history dates back to approximately November when she developed of vaginal discharge. Overtime some blood was noted in the discharge. Ultimately the patient was referred to Dr. Gertie Fey for further evaluation. An endometrial biopsy was obtained on 01/26/2013 showed a high-grade endometrial carcinoma with serous features. Subsequently the patient's had an MRI of the pelvis showing a fibroid uterus and prominent bilateral iliac lymph nodes measuring between 1 cm and 7 mm. Past gynecologic history includes a bilateral tubal ligation. Obstetrical history gravida 2.  The patient was referred to gynecologic oncology and On 03/01/13, she underwent a total abdominal hysterectomy, bilateral salpingo-oophorectomy, pelvic and para-aortic lymphadenectomy by Dr. Fermin Schwab. Her post-operative course was uneventful. Final pathology revealed: 1. Uterus, ovaries and fallopian tubes, with cervix - INVASIVE HIGH GRADE SEROUS CARCINOMA WITH ASSOCIATED EXTENSIVE LYMPHATIC INVASION, EXTENDING TO OUTER HALF OF THE MYOMETRIUM. - RIGHT OVARY: METASTATIC SEROUS CARCINOMA WITH EXTENSIVE ANGIOLYMPHATIC INVASION  PRESENT. - MYOMETRIUM: LEIOMYOMATA AND ADENOMYOSIS. - CERVIX: BENIGN SQUAMOUS MUCOSA AND ENDOCERVICAL MUCOSA, NO DYSPLASIA OR MALIGNANCY. - LEFT OVARY AND FALLOPIAN TUBES: BENIGN OVARIAN TISSUE AND BILATERAL FALLOPIAN TUBAL TISSUE, NO EVIDENCE OF MALIGNANCY.  2. Lymph nodes, regional resection, left pelvic  - ONE OF FIVE LYMPH NODES, POSITIVE FOR METASTATIC CARCINOMA (1/5).  3. Lymph nodes, regional resection, right pelvic  - ONE OF FOUR LYMPH NODES, POSITIVE FOR METASTATIC CARCINOMA (1/4)  4. Lymph nodes, regional resection, aortic  - THREE OF FOUR LYMPH NODES, POSITIVE FOR METASTATIC CARCINOMA(3/4) She has done well since her surgery except for some mild wound problems.  Patient was seen by medical oncology who has recommended adjuvant chemotherapy.  Patient is now seen in radiation oncology to be considered for combination postoperative treatments (sandwich therapy).   PREVIOUS RADIATION THERAPY: No  PAST MEDICAL HISTORY:  has a past medical history of Glaucoma and Cancer.    PAST SURGICAL HISTORY: Past Surgical History  Procedure Laterality Date  . Cholecystectomy    . Appendectomy    . Abdominal hysterectomy N/A 03/01/2013    Procedure: HYSTERECTOMY ABDOMINAL TOTAL/BILATERAL SALPINGO OOPHORECTOMY/WITH PELVIC AND PARA AORTIC LYMPHADNECTOMY ;  Surgeon: Alvino Chapel, MD;  Location: [facility];  Herring: Gynecology;  Laterality: N/A;  . Salpingoophorectomy Bilateral 03/01/2013    Procedure: BILATERAL SALPINGO OOPHORECTOMY;  Surgeon: Alvino Chapel, MD;  Location: [facility];  Herring: Gynecology;  Laterality: Bilateral;    FAMILY HISTORY: family history includes Breast cancer in her maternal aunt.  SOCIAL HISTORY:  reports that she has never smoked. She has never used smokeless tobacco. She reports that she does not drink alcohol or use illicit drugs.  ALLERGIES: Bactrim and Ciprofloxacin  MEDICATIONS:  Current Outpatient Prescriptions  Medication Sig Dispense Refill  .  Brinzolamide-Brimonidine (SIMBRINZA) 1-0.2 % SUSP Place 1 drop into both eyes 2 (two) times daily.       . cephALEXin (KEFLEX) 500 MG capsule Take 1 capsule (500 mg total) by mouth 4 (four) times daily. For woiud and bladder  28 capsule  0  . dexamethasone (DECADRON) 4 MG tablet Take 5 tabs = 20 mg with food 12 hours and 6 hours prior to Taxol chemotherapy.  10 tablet  0  . enoxaparin (LOVENOX) 40 MG/0.4ML injection Inject 0.4 mLs (40 mg total) into the skin daily.  25 Syringe  0  . fish oil-omega-3 fatty acids 1000 MG capsule Take 1 g by mouth 2 (two) times daily.       Ebony Herring Kitchen LORazepam (ATIVAN) 0.5 MG tablet Take 1 tablet under the tongue or swallow every 6 hours as needed for nausea  20 tablet  0  . LORazepam (ATIVAN) 0.5 MG tablet Take 1 tablet (0.5 mg total) by mouth every 6 (six) hours as needed (Nausea or vomiting).  30 tablet  0  . ondansetron (ZOFRAN) 8 MG tablet Take 1-2 tablets (8-16 mg total) by mouth every 12 (twelve) hours as needed for nausea or vomiting.  20 tablet  1  . ondansetron (ZOFRAN) 8 MG tablet Take 1 tablet (8 mg total) by mouth 2 (two) times daily. Take two times a day starting the day after chemo for 3 days. Then take two times a day as needed for nausea or vomiting.  30 tablet  1  . oxyCODONE (OXY IR/ROXICODONE) 5 MG immediate release tablet Take 1 tablet (5 mg total) by mouth every 4 (four) hours as needed for moderate pain or severe pain.  30 tablet  0   No current facility-administered medications for this encounter.    REVIEW OF SYSTEMS:  A 15 point review of systems is documented in the electronic medical record. This was obtained by the nursing staff. However, I reviewed this with the patient to discuss relevant findings and make appropriate changes.  She has had some mild constipation problems since her surgery. She denies any cough or breathing problems. She's had some mild swelling in her legs since surgery. She denies any vaginal bleeding or discharge. She denies any  urination difficulties.    PHYSICAL EXAM:  height is 5' 2.75" (1.594 m) and weight is 137 lb 6.4 oz (62.324 kg). Her temperature is 97.6 F (36.4 C). Her blood pressure is 142/74 and her pulse is 88.   BP 142/74  Pulse 88  Temp(Src) 97.6 F (36.4 C)  Ht 5' 2.75" (1.594 m)  Wt 137 lb 6.4 oz (62.324 kg)  BMI 24.53 kg/m2  General Appearance:    Alert, cooperative, no distress, appears stated age, accompanied by her husband on evaluation today   Head:    Normocephalic, without obvious abnormality, atraumatic  Eyes:    PERRL, conjunctiva/corneas clear, EOM's intact    Ears:    Normal TM's and external ear canals, both ears  Nose:   Nares normal, septum midline, mucosa normal, no drainage    or sinus tenderness  Throat:   Lips, mucosa, and tongue normal; teeth and gums normal  Neck:   Supple, symmetrical, trachea midline, no adenopathy;    thyroid:  no enlargement/tenderness/nodules; no carotid   bruit or JVD  Back:     Symmetric, no curvature, ROM normal, no CVA tenderness  Lungs:     Clear to auscultation bilaterally, respirations unlabored  Chest Wall:    No tenderness or deformity  Heart:    Regular rate and rhythm, S1 and S2 normal, no murmur, rub   or gallop     Abdomen:     Soft, non-tender, bowel sounds active all four quadrants,    no masses, no organomegaly, long vertical scar which is healing well, slight separation along the superior aspect of the scar which is packed with gauze   Genitalia:   deferred to simulation and planning day   Rectal:   deferred to simulation and planning day   Extremities:   Extremities normal, atraumatic, no cyanosis or edema  Pulses:   2+ and symmetric all extremities  Skin:   Skin color, texture, turgor normal, no rashes or lesions  Lymph nodes:   Cervical, supraclavicular, and axillary nodes normal  Neurologic:   CNII-XII intact, normal strength, sensation and reflexes    throughout    ECOG = 1  1 - Symptomatic but completely ambulatory  (Restricted in physically strenuous activity but ambulatory and able to carry out work of a light or sedentary nature. For example, light housework, office work)   LABORATORY DATA:  Lab Results  Component Value Date   WBC 9.2 03/14/2013   HGB 12.4 03/14/2013   HCT 37.4 03/14/2013   MCV 91.1 03/14/2013   PLT 491* 03/14/2013   Lab Results  Component Value Date   NA 137 03/14/2013   K 4.3 03/14/2013   CL 99 03/02/2013   CO2 25 03/14/2013   Lab Results  Component Value Date   ALT 22 03/14/2013   AST 23 03/14/2013   ALKPHOS 74 03/14/2013   BILITOT 0.44 03/14/2013     RADIOGRAPHY: Chest 2 View  02/25/2013   CLINICAL DATA:  History of endometrial carcinoma  EXAM: CHEST  2 VIEW  COMPARISON:  PA and lateral chest x-ray of June 08, 2011  FINDINGS: The lungs are adequately inflated. There is no focal infiltrate. There is linear density that projects just above the midportion of the right hemidiaphragm which is stable. This likely reflects anterior pleural reflection on the lateral film. There is a moderate-sized hiatal hernia. The cardiac silhouette is normal in size. The mediastinum is normal in width. There is no pleural effusion. The observed portions of the bony thorax appear normal. The gas pattern within the upper abdomen is normal.  IMPRESSION: There is no evidence of pneumonia nor CHF nor other acute cardiopulmonary abnormality. There are no findings suspicious for metastatic disease.   Electronically Signed   By: David  [place]   On: 02/25/2013 13:44      IMPRESSION:  Endometrial cancer - high-grade serous carcinoma, Pathologic: Stage IIIC2 (T3a, N2, M0).  Patient is at high risk for recurrence and I would recommend aggressive adjuvant treatment including chemotherapy and radiation treatments directed at the pelvis and periaortic area.  I may also consider intracavitary brachytherapy treatments given the serous designation with her pathology and findings of angiolymphatic invasion with a deeply invasive tumor. I  discussed the expected course of radiation therapy,side effects and potential long-term toxicities of pelvis and periaortic irradiation with the patient and her husband. She appears to understand and wishes to proceed with this treatment as part of her postoperative management.  PLAN: The patient is tentatively scheduled to begin adjuvant chemotherapy later this month and will receive 3 cycles followed by external beam radiation therapy (pelvis and periaortic) which will then be followed by additional chemotherapy.  I spent 60 minutes minutes face to face with the patient and more than 50% of that  time was spent in counseling and/or coordination of care.   ------------------------------------------------  -----------------------------------  Blair Promise, PhD, MD

## 2013-03-19 ENCOUNTER — Other Ambulatory Visit: Payer: Self-pay | Admitting: Oncology

## 2013-03-19 ENCOUNTER — Encounter: Payer: Self-pay | Admitting: Oncology

## 2013-03-20 ENCOUNTER — Other Ambulatory Visit: Payer: Self-pay | Admitting: Oncology

## 2013-03-20 DIAGNOSIS — C541 Malignant neoplasm of endometrium: Secondary | ICD-10-CM

## 2013-03-25 ENCOUNTER — Telehealth: Payer: Self-pay | Admitting: *Deleted

## 2013-03-25 ENCOUNTER — Other Ambulatory Visit (HOSPITAL_BASED_OUTPATIENT_CLINIC_OR_DEPARTMENT_OTHER): Payer: Medicare Other

## 2013-03-25 ENCOUNTER — Encounter: Payer: Self-pay | Admitting: Oncology

## 2013-03-25 ENCOUNTER — Ambulatory Visit (HOSPITAL_BASED_OUTPATIENT_CLINIC_OR_DEPARTMENT_OTHER): Payer: Medicare Other | Admitting: Oncology

## 2013-03-25 ENCOUNTER — Encounter (INDEPENDENT_AMBULATORY_CARE_PROVIDER_SITE_OTHER): Payer: Self-pay

## 2013-03-25 VITALS — BP 119/68 | HR 89 | Temp 98.2°F | Resp 20 | Ht 62.75 in | Wt 134.3 lb

## 2013-03-25 DIAGNOSIS — C541 Malignant neoplasm of endometrium: Secondary | ICD-10-CM

## 2013-03-25 DIAGNOSIS — C549 Malignant neoplasm of corpus uteri, unspecified: Secondary | ICD-10-CM

## 2013-03-25 DIAGNOSIS — Z87440 Personal history of urinary (tract) infections: Secondary | ICD-10-CM

## 2013-03-25 DIAGNOSIS — N390 Urinary tract infection, site not specified: Secondary | ICD-10-CM

## 2013-03-25 LAB — BASIC METABOLIC PANEL (CC13)
Anion Gap: 9 mEq/L (ref 3–11)
BUN: 11.8 mg/dL (ref 7.0–26.0)
CO2: 26 meq/L (ref 22–29)
Calcium: 9.2 mg/dL (ref 8.4–10.4)
Chloride: 105 mEq/L (ref 98–109)
Creatinine: 0.8 mg/dL (ref 0.6–1.1)
GLUCOSE: 96 mg/dL (ref 70–140)
POTASSIUM: 4.3 meq/L (ref 3.5–5.1)
Sodium: 140 mEq/L (ref 136–145)

## 2013-03-25 LAB — CBC WITH DIFFERENTIAL/PLATELET
BASO%: 0.4 % (ref 0.0–2.0)
Basophils Absolute: 0 10*3/uL (ref 0.0–0.1)
EOS ABS: 0.3 10*3/uL (ref 0.0–0.5)
EOS%: 5.7 % (ref 0.0–7.0)
HCT: 36.4 % (ref 34.8–46.6)
HEMOGLOBIN: 11.7 g/dL (ref 11.6–15.9)
LYMPH%: 38.3 % (ref 14.0–49.7)
MCH: 29.7 pg (ref 25.1–34.0)
MCHC: 32.1 g/dL (ref 31.5–36.0)
MCV: 92.4 fL (ref 79.5–101.0)
MONO#: 0.4 10*3/uL (ref 0.1–0.9)
MONO%: 6.8 % (ref 0.0–14.0)
NEUT%: 48.8 % (ref 38.4–76.8)
NEUTROS ABS: 2.6 10*3/uL (ref 1.5–6.5)
Platelets: 357 10*3/uL (ref 145–400)
RBC: 3.94 10*6/uL (ref 3.70–5.45)
RDW: 13.2 % (ref 11.2–14.5)
WBC: 5.3 10*3/uL (ref 3.9–10.3)
lymph#: 2 10*3/uL (ref 0.9–3.3)

## 2013-03-25 LAB — URINALYSIS, MICROSCOPIC - CHCC
BILIRUBIN (URINE): NEGATIVE
Glucose: NEGATIVE mg/dL
Ketones: NEGATIVE mg/dL
NITRITE: NEGATIVE
PH: 6 (ref 4.6–8.0)
PROTEIN: 30 mg/dL
Specific Gravity, Urine: 1.025 (ref 1.003–1.035)
Urobilinogen, UR: 0.2 mg/dL (ref 0.2–1)

## 2013-03-25 NOTE — Telephone Encounter (Signed)
appts made and printed. Pt is aware that tx will be added. i emailed MW to add the tx...td 

## 2013-03-25 NOTE — Patient Instructions (Signed)
Night before chemo, you will take 2 doses of steroid dexamethasone (decadron). You will take five of the 4mg  tablets (= 20 mg) 12 hours before chemo and five tablets 6 hours before chemo. You need to eat a little something with each dose. Since your chemo on Mon 2-16 will be at 1230, you need to take the five steroid tablets with food at 1230 AM (midnight thirty!) 2-16 and five tablets with food at 6:30 AM on 2-16.  Night of chemo (=Mon night 2-16) you should take the ativan (lorazepam) 0.5 mg nausea pill at bedtime, whether or not you have any nausea. This will make you drowsy. Morning after chemo, whether or not any nausea, you should take the zofran (ondansetron) 8 mg tablet, which will not make you drowsy. Other than these 2 doses, you can take the nausea medicines as instructed on bottles.  Call if any questions or problems [phone]

## 2013-03-25 NOTE — Telephone Encounter (Signed)
Per staff message and POF I have scheduled appts.  JMW  

## 2013-03-25 NOTE — Progress Notes (Signed)
OFFICE PROGRESS NOTE   03/25/2013   Physicians:Daniel ClarkePearson, [place], Searcy F (PCP [facility]), Gery Pray, Everlene Farrier, Donato Heinz (ophth)   INTERVAL HISTORY:  Patient is seen, together with husband, for reevaluation prior to planned start of adjuvant chemotherapy on 03-28-13 for IIIC2 high grade endometrial carcinoma; we expect to give the chemotherapy in sandwich fashion with RT, with 3 cycles prior to RT and 3 cycles after completion of RT. She had consultation visit with Dr Sondra Come on 03-16-13. The abdominal wound has improved greatly since I saw her on 03-14-13; she completed Keflex ~ 3 days ago. She continues daily lovenox, 6 doses remaining. She has noticed some abdominal swelling, without pain, some better when bowels move as they have been doing daily. Appetite is better, no nausea, is eating prunes. Energy is improving.   ONCOLOGIC HISTORY Patient presented to PCP with vaginal discharge with some bleeding since Nov 2014. She was referred  to Dr Gaetano Net, with endometrial biopsy 01-26-2013 showing high grade endometrial carcinoma with serous features. MRI pelvis 01-20-13 Had uterine mass and prominent bilateral iliac nodes. She had TAH BSO and pelvic and para-aortic lymphadenectomy by Dr Josephina Shih on 03-01-2013. Pathology [phone]) had high grade serous carcinoma of endometrium extending to outer half of myometrium, with extensive angiolymphatic invasion, involvement of right ovary, involvement of 2/9 pelvic nodes and 3/4 para-aortic nodes. At completion of surgery there was no gross residual disease. She was transfused 2 units of PRBCs on 03-05-13. She had small separation of upper portion of incision and erythema at lower portion of incision initially, both resolved prior to start of chemo. She had E.coli UTI 03-07-13.      Review of systems as above, also: No fever. No dysuria. No LE swelling. No bleeding. No SOB. Remainder of 10 point Review of Systems  negative.  Objective:  Vital signs in last 24 hours:  BP 119/68  Pulse 89  Temp(Src) 98.2 F (36.8 C) (Oral)  Resp 20  Ht 5' 2.75" (1.594 m)  Wt 134 lb 4.8 oz (60.918 kg)  BMI 23.98 kg/m2 Weight is down 3 lbs Alert, oriented and appropriate. Quickly and easily ambulatory.    HEENT:PERRL, sclerae not icteric. Oral mucosa moist without lesions, posterior pharynx clear.  Neck supple. No JVD.  Lymphatics:no cervical,suraclavicular adenopathy Resp: clear to auscultation bilaterally and normal percussion bilaterally Cardio: regular rate and rhythm. No gallop. GI: soft, nontender, not distended, no mass or organomegaly. Normally active bowel sounds. Surgical incision closed, 0.5 cm eschar at area of previous separation, no erythema, heat or tenderness now along lower incision. Musculoskeletal/ Extremities: without pitting edema, cords, tenderness. Peripheral veins appear adequate for chemo. Neuro: no peripheral neuropathy. Otherwise nonfocal. Psych: mood and affect appropriate. Skin without rash, ecchymosis, petechiae   Lab Results:  Results for orders placed in visit on 03/25/13  CBC WITH DIFFERENTIAL      Result Value Ref Range   WBC 5.3  3.9 - 10.3 10e3/uL   NEUT# 2.6  1.5 - 6.5 10e3/uL   HGB 11.7  11.6 - 15.9 g/dL   HCT 36.4  34.8 - 46.6 %   Platelets 357  145 - 400 10e3/uL   MCV 92.4  79.5 - 101.0 fL   MCH 29.7  25.1 - 34.0 pg   MCHC 32.1  31.5 - 36.0 g/dL   RBC 3.94  3.70 - 5.45 10e6/uL   RDW 13.2  11.2 - 14.5 %   lymph# 2.0  0.9 - 3.3 10e3/uL   MONO# 0.4  0.1 - 0.9 10e3/uL   Eosinophils Absolute 0.3  0.0 - 0.5 10e3/uL   Basophils Absolute 0.0  0.0 - 0.1 10e3/uL   NEUT% 48.8  38.4 - 76.8 %   LYMPH% 38.3  14.0 - 49.7 %   MONO% 6.8  0.0 - 14.0 %   EOS% 5.7  0.0 - 7.0 %   BASO% 0.4  0.0 - 2.0 %  BASIC METABOLIC PANEL (QM08)      Result Value Ref Range   Sodium 140  136 - 145 mEq/L   Potassium 4.3  3.5 - 5.1 mEq/L   Chloride 105  98 - 109 mEq/L   CO2 26  22 - 29  mEq/L   Glucose 96  70 - 140 mg/dl   BUN 11.8  7.0 - 26.0 mg/dL   Creatinine 0.8  0.6 - 1.1 mg/dL   Calcium 9.2  8.4 - 10.4 mg/dL   Anion Gap 9  3 - 11 mEq/L  URINALYSIS, MICROSCOPIC - [facility]      Result Value Ref Range   Glucose Negative  Negative mg/dL   Bilirubin (Urine) Negative  Negative   Ketones Negative  Negative mg/dL   Specific Gravity, Urine 1.025  1.003 - 1.035   Blood Moderate  Negative   pH 6.0  4.6 - 8.0   Protein 30  Negative- <30 mg/dL   Urobilinogen, UR 0.2  0.2 - 1 mg/dL   Nitrite Negative  Negative   Leukocyte Esterase Large  Negative   RBC / HPF 3-6  0 - 2   WBC, UA 21-50  0 - 2   Bacteria, UA Many  Negative- Trace   Epithelial Cells Moderate  Negative- Few   Mucus, UA Moderate  Negative- Small    Urine culture pending  Studies/Results:  No results found.  Medications: I have reviewed the patient's current medications.  We have reviewed instructions for premedication decadron, written instructions also given. I have told her to take ativan night of chemo and zofran AM after chemo whether or not any nausea, written instructions also given.   DISCUSSION: possible taxol aches reviewed. Contact information for this office reviewed. She and husband understand that we plan treatments every 3 weeks for total of 6 treatments. NOTE plan is for radiation after first 3 cycles of chemo. We have discussed length of treatments, as husband not able to drive after dark. By completion of careful discussion today, patient and husband seemed to have better understanding of above.  Assessment/Plan: 1.IIIC2 high grade serous endometrial carcinoma in 80 yo lady with no other major medical problems: post TAH BSO pelvic and para-aortic node evaluation by Dr Josephina Shih 03-01-2013. Plan q 3 week taxol carboplatin beginning 03-28-13, given in sandwich fashion with RT after cycle 3 chemotherapy. I will see her with lab 2-23. She will see Dr Josephina Shih 2-27, and we may want CBC day of  his visit. Cycle 2 will be due 3-9. 2.Wound now closed and erythema resolved, Keflex completed.  3. Flu vaccine given today  4. Glaucoma  5.no mammograms in years. No colonoscopy. We will try to address these when possible, with priority at this point being recovery from surgery and initiation of therapy for the endometrial cancer.  6.E.coli UTI post op; Viridans strep in urine preop. Repeat urine culture pending, no antibiotics presently. 7.prophylactic lovenox post op nearly completed   Time spent 25 min, >50% counseling and coordination of care     LIVESAY,LENNIS P, MD   03/25/2013, 10:06 AM

## 2013-03-27 LAB — URINE CULTURE

## 2013-03-28 ENCOUNTER — Telehealth: Payer: Self-pay

## 2013-03-28 ENCOUNTER — Other Ambulatory Visit (HOSPITAL_BASED_OUTPATIENT_CLINIC_OR_DEPARTMENT_OTHER): Payer: Medicare Other

## 2013-03-28 ENCOUNTER — Other Ambulatory Visit: Payer: Self-pay | Admitting: Oncology

## 2013-03-28 ENCOUNTER — Ambulatory Visit (HOSPITAL_BASED_OUTPATIENT_CLINIC_OR_DEPARTMENT_OTHER): Payer: Medicare Other

## 2013-03-28 VITALS — BP 141/71 | HR 72 | Temp 98.2°F | Resp 18

## 2013-03-28 DIAGNOSIS — C549 Malignant neoplasm of corpus uteri, unspecified: Secondary | ICD-10-CM

## 2013-03-28 DIAGNOSIS — N390 Urinary tract infection, site not specified: Secondary | ICD-10-CM

## 2013-03-28 DIAGNOSIS — C541 Malignant neoplasm of endometrium: Secondary | ICD-10-CM

## 2013-03-28 DIAGNOSIS — Z5111 Encounter for antineoplastic chemotherapy: Secondary | ICD-10-CM

## 2013-03-28 LAB — CBC WITH DIFFERENTIAL/PLATELET
BASO%: 0.6 % (ref 0.0–2.0)
Basophils Absolute: 0 10*3/uL (ref 0.0–0.1)
EOS ABS: 0 10*3/uL (ref 0.0–0.5)
EOS%: 0 % (ref 0.0–7.0)
HEMATOCRIT: 37.8 % (ref 34.8–46.6)
HGB: 12.2 g/dL (ref 11.6–15.9)
LYMPH%: 20.1 % (ref 14.0–49.7)
MCH: 29.6 pg (ref 25.1–34.0)
MCHC: 32.3 g/dL (ref 31.5–36.0)
MCV: 91.7 fL (ref 79.5–101.0)
MONO#: 0.1 10*3/uL (ref 0.1–0.9)
MONO%: 1.5 % (ref 0.0–14.0)
NEUT%: 77.8 % — AB (ref 38.4–76.8)
NEUTROS ABS: 3.6 10*3/uL (ref 1.5–6.5)
PLATELETS: 347 10*3/uL (ref 145–400)
RBC: 4.12 10*6/uL (ref 3.70–5.45)
RDW: 13.2 % (ref 11.2–14.5)
WBC: 4.7 10*3/uL (ref 3.9–10.3)
lymph#: 0.9 10*3/uL (ref 0.9–3.3)
nRBC: 0 % (ref 0–0)

## 2013-03-28 MED ORDER — NITROFURANTOIN MONOHYD MACRO 100 MG PO CAPS: 100.0000 mg | ORAL_CAPSULE | Freq: Two times a day (BID) | ORAL | Status: DC

## 2013-03-28 MED ORDER — SODIUM CHLORIDE 0.9 % IV SOLN
Freq: Once | INTRAVENOUS | Status: AC
  Administered 2013-03-28: 13:00:00 via INTRAVENOUS

## 2013-03-28 MED ORDER — ONDANSETRON 16 MG/50ML IVPB (CHCC)
16.0000 mg | Freq: Once | INTRAVENOUS | Status: AC
  Administered 2013-03-28: 16 mg via INTRAVENOUS

## 2013-03-28 MED ORDER — SODIUM CHLORIDE 0.9 % IV SOLN
175.0000 mg/m2 | Freq: Once | INTRAVENOUS | Status: AC
  Administered 2013-03-28: 288 mg via INTRAVENOUS
  Filled 2013-03-28: qty 48

## 2013-03-28 MED ORDER — SODIUM CHLORIDE 0.9 % IV SOLN
349.0000 mg | Freq: Once | INTRAVENOUS | Status: AC
  Administered 2013-03-28: 350 mg via INTRAVENOUS
  Filled 2013-03-28: qty 35

## 2013-03-28 MED ORDER — DEXAMETHASONE SODIUM PHOSPHATE 20 MG/5ML IJ SOLN
INTRAMUSCULAR | Status: AC
  Filled 2013-03-28: qty 5

## 2013-03-28 MED ORDER — NITROFURANTOIN MONOHYD MACRO 100 MG PO CAPS: 100.0000 mg | ORAL_CAPSULE | Freq: Once | ORAL | Status: DC

## 2013-03-28 MED ORDER — FAMOTIDINE IN NACL 20-0.9 MG/50ML-% IV SOLN
INTRAVENOUS | Status: AC
  Filled 2013-03-28: qty 50

## 2013-03-28 MED ORDER — DIPHENHYDRAMINE HCL 50 MG/ML IJ SOLN
25.0000 mg | Freq: Once | INTRAMUSCULAR | Status: AC
  Administered 2013-03-28: 25 mg via INTRAVENOUS

## 2013-03-28 MED ORDER — DEXAMETHASONE SODIUM PHOSPHATE 20 MG/5ML IJ SOLN
20.0000 mg | Freq: Once | INTRAMUSCULAR | Status: AC
  Administered 2013-03-28: 20 mg via INTRAVENOUS

## 2013-03-28 MED ORDER — DIPHENHYDRAMINE HCL 50 MG/ML IJ SOLN
INTRAMUSCULAR | Status: AC
  Filled 2013-03-28: qty 1

## 2013-03-28 MED ORDER — NITROFURANTOIN MONOHYD MACRO 100 MG PO CAPS
100.0000 mg | ORAL_CAPSULE | Freq: Once | ORAL | Status: AC
  Administered 2013-03-28: 100 mg via ORAL
  Filled 2013-03-28: qty 1

## 2013-03-28 MED ORDER — FAMOTIDINE IN NACL 20-0.9 MG/50ML-% IV SOLN
20.0000 mg | Freq: Once | INTRAVENOUS | Status: AC
  Administered 2013-03-28: 20 mg via INTRAVENOUS

## 2013-03-28 NOTE — Telephone Encounter (Signed)
Told Ebony Herring that she has a UTI from the unrine culture 03-25-13.  Sent prescription for macrobid to her pharmacy.  She will receive the first dose while she is here today in infusion.  Pt. Verbalized understanding.

## 2013-03-29 ENCOUNTER — Telehealth: Payer: Self-pay | Admitting: *Deleted

## 2013-03-29 NOTE — Telephone Encounter (Signed)
Called Ebony Herring for chemotherapy F/U.  Patient is doing well.  Denies n/v.  Denies any new side effects or symptoms.  Bowel and bladder is functioning well.  Eating and drinking well and I instructed to drink 64 oz minimum daily or at least the day before, of and after treatment.  Asks what to take for headache that started about 3:15.  Normally takes tylenol.  Has placed wet cloth to forehead.  Denies visual changes.  Informed her it is okay to take tylenol.   Asked if she may take off the brown dressing where the IV was removed.  Denies further questions at this time and encouraged to call if needed.  Reviewed how to call after hours in the case of an emergency.

## 2013-03-29 NOTE — Telephone Encounter (Signed)
Message copied by Cherylynn Ridges on Tue Mar 29, 2013  4:27 PM ------      Message from: Randolm Idol      Created: Mon Mar 28, 2013 12:53 PM      Regarding: chemo follow up call       First taxol Norma Fredrickson, dr Marko Plume. Call 03/29/13 [phone] ------

## 2013-03-31 ENCOUNTER — Other Ambulatory Visit: Payer: Self-pay | Admitting: Oncology

## 2013-03-31 DIAGNOSIS — C541 Malignant neoplasm of endometrium: Secondary | ICD-10-CM

## 2013-04-04 ENCOUNTER — Other Ambulatory Visit (HOSPITAL_BASED_OUTPATIENT_CLINIC_OR_DEPARTMENT_OTHER): Payer: Medicare Other

## 2013-04-04 ENCOUNTER — Encounter: Payer: Self-pay | Admitting: Oncology

## 2013-04-04 ENCOUNTER — Ambulatory Visit (HOSPITAL_BASED_OUTPATIENT_CLINIC_OR_DEPARTMENT_OTHER): Payer: Medicare Other | Admitting: Oncology

## 2013-04-04 ENCOUNTER — Telehealth: Payer: Self-pay | Admitting: Oncology

## 2013-04-04 VITALS — BP 142/71 | HR 93 | Temp 98.3°F | Resp 18 | Ht 62.75 in | Wt 128.1 lb

## 2013-04-04 DIAGNOSIS — C549 Malignant neoplasm of corpus uteri, unspecified: Secondary | ICD-10-CM

## 2013-04-04 DIAGNOSIS — C541 Malignant neoplasm of endometrium: Secondary | ICD-10-CM

## 2013-04-04 LAB — COMPREHENSIVE METABOLIC PANEL (CC13)
ALBUMIN: 3.7 g/dL (ref 3.5–5.0)
ALT: 18 U/L (ref 0–55)
AST: 22 U/L (ref 5–34)
Alkaline Phosphatase: 62 U/L (ref 40–150)
Anion Gap: 11 mEq/L (ref 3–11)
BILIRUBIN TOTAL: 0.36 mg/dL (ref 0.20–1.20)
BUN: 18.5 mg/dL (ref 7.0–26.0)
CO2: 25 mEq/L (ref 22–29)
Calcium: 9.3 mg/dL (ref 8.4–10.4)
Chloride: 104 mEq/L (ref 98–109)
Creatinine: 1 mg/dL (ref 0.6–1.1)
GLUCOSE: 109 mg/dL (ref 70–140)
POTASSIUM: 3.7 meq/L (ref 3.5–5.1)
SODIUM: 140 meq/L (ref 136–145)
TOTAL PROTEIN: 6.6 g/dL (ref 6.4–8.3)

## 2013-04-04 LAB — CBC WITH DIFFERENTIAL/PLATELET
BASO%: 2.1 % — ABNORMAL HIGH (ref 0.0–2.0)
Basophils Absolute: 0.1 10*3/uL (ref 0.0–0.1)
EOS ABS: 0.2 10*3/uL (ref 0.0–0.5)
EOS%: 3.8 % (ref 0.0–7.0)
HCT: 33.4 % — ABNORMAL LOW (ref 34.8–46.6)
HGB: 11 g/dL — ABNORMAL LOW (ref 11.6–15.9)
LYMPH%: 38.6 % (ref 14.0–49.7)
MCH: 29.7 pg (ref 25.1–34.0)
MCHC: 33 g/dL (ref 31.5–36.0)
MCV: 90.1 fL (ref 79.5–101.0)
MONO#: 0.3 10*3/uL (ref 0.1–0.9)
MONO%: 6.3 % (ref 0.0–14.0)
NEUT#: 2 10*3/uL (ref 1.5–6.5)
NEUT%: 49.2 % (ref 38.4–76.8)
PLATELETS: 309 10*3/uL (ref 145–400)
RBC: 3.7 10*6/uL (ref 3.70–5.45)
RDW: 13.1 % (ref 11.2–14.5)
WBC: 4.1 10*3/uL (ref 3.9–10.3)
lymph#: 1.6 10*3/uL (ref 0.9–3.3)

## 2013-04-04 NOTE — Patient Instructions (Signed)
You will have 3 chemotherapy treatments before radiation and 3 chemotherapy treatments after radiation. The chemo is given every 3 weeks: Feb 16, March 9 and March 30.  We will recheck blood counts when you come to see Dr Josephina Shih on Friday 2-27. If your white blood cells are low, we will start neupogen shots then. We will repeat urine test also on 2-27 to be sure the infection is completely gone.   You will see Dr Owens Loffler on March 6 to be sure things are good to take second chemotherapy on March 9. You will see Dr Marko Plume on March 19 with labs.  Call if needed before your next appointment.

## 2013-04-04 NOTE — Progress Notes (Signed)
OFFICE PROGRESS NOTE   04/04/2013   Physicians:  INTERVAL HISTORY:  Patient is seen, together with husband, in follow up of first cycle of adjuvant taxol carboplatin given 03-28-13 for IIIC2 high grade endometrial cancer. She has not had gCSF. She has tolerated the first chemotherapy well thus far, with minimal nausea x 1 resolved with prn anitemetic, some aching in knees now resolved, no significant fatigue, no peripheral neuropathy symptoms. She completed 7 day course of macrobid for E coli UTI on 04-03-13; she will have repeat UA, C&S on 04-08-13 as this was third UTI since surgery. She is to see Dr Josephina Shih on 04-08-13.  She does not have [name].   ONCOLOGIC HISTORY Patient presented to PCP with vaginal discharge with some bleeding since Nov 2014.  Dr Gaetano Net did endometrial biopsy 01-26-2013 which had high grade endometrial carcinoma with serous features. MRI pelvis 01-20-13 had uterine mass and prominent bilateral iliac nodes. She had TAH BSO and pelvic and para-aortic lymphadenectomy by Dr Josephina Shih on 03-01-2013. Pathology [phone]): high grade serous carcinoma of endometrium extending to outer half of myometrium, with extensive angiolymphatic invasion, involvement of right ovary, involvement of 2/9 pelvic nodes and 3/4 para-aortic nodes. At completion of surgery there was no gross residual disease. She was transfused 2 units of PRBCs on 03-05-13. She had small separation of upper portion of incision and erythema at lower portion of incision initially, both resolved prior to start of chemo. Perioperative course was complicated by UTIs. First carboplatin taxol was given 03-28-2013.     Review of systems as above, also: Bladder symptoms improved with nitrofurantoin completed 04-03-13. No fever, no other symptoms of infection. Decreased taste sensation but still eating and drinking fluids. Bowels moving ~ every other day. No LE swelling. No increased SOB. Peripheral IV access not difficult.  No bleeding. Feels "puffy" in abdomen. No pain. More active at home tho not back to full activity. Remainder of 10 point Review of Systems negative.  Objective:  Vital signs in last 24 hours:  BP 142/71  Pulse 93  Temp(Src) 98.3 F (36.8 C) (Oral)  Resp 18  Ht 5' 2.75" (1.594 m)  Wt 128 lb 1.6 oz (58.106 kg)  BMI 22.87 kg/m2 Weight is down 6 lbs. Alert, oriented and appropriate. Ambulatory without difficulty.  No alopecia  HEENT:PERRL, sclerae not icteric. Oral mucosa moist without lesions, posterior pharynx clear.  Neck supple. No JVD.  Lymphatics:no cervical,suraclavicular, axillary or inguinal adenopathy Resp: clear to auscultation bilaterally and normal percussion bilaterally Cardio: regular rate and rhythm. No gallop. GI: soft, nontender, not distended, no mass or organomegaly. Some bowel sounds. Surgical incision closed, no erythema, no tenderness. Musculoskeletal/ Extremities: without pitting edema, cords, tenderness Neuro: no peripheral neuropathy. Otherwise nonfocal Skin without rash, ecchymosis, petechiae   Lab Results:  Results for orders placed in visit on 04/04/13  CBC WITH DIFFERENTIAL      Result Value Ref Range   WBC 4.1  3.9 - 10.3 10e3/uL   NEUT# 2.0  1.5 - 6.5 10e3/uL   HGB 11.0 (*) 11.6 - 15.9 g/dL   HCT 33.4 (*) 34.8 - 46.6 %   Platelets 309  145 - 400 10e3/uL   MCV 90.1  79.5 - 101.0 fL   MCH 29.7  25.1 - 34.0 pg   MCHC 33.0  31.5 - 36.0 g/dL   RBC 3.70  3.70 - 5.45 10e6/uL   RDW 13.1  11.2 - 14.5 %   lymph# 1.6  0.9 - 3.3 10e3/uL   MONO#  0.3  0.1 - 0.9 10e3/uL   Eosinophils Absolute 0.2  0.0 - 0.5 10e3/uL   Basophils Absolute 0.1  0.0 - 0.1 10e3/uL   NEUT% 49.2  38.4 - 76.8 %   LYMPH% 38.6  14.0 - 49.7 %   MONO% 6.3  0.0 - 14.0 %   EOS% 3.8  0.0 - 7.0 %   BASO% 2.1 (*) 0.0 - 2.0 %  Counts above have not nadired from chemo 2-16.  CMET available after visit entirely normal, including creatinine 1.0, BUN 18.5, Tprot 6.6, alb  3.7   Studies/Results:  No results found.  Medications: I have reviewed the patient's current medications. Post op lovenox completed. Nitrofurantion completed last PM.  DISCUSSION: she understands plan for 3 cycles of chemotherapy before radiation, then 3 additional cycles of chemo after radiation.  Will repeat urinalysis and culture on 04-08-13 to be sure infection cleared (had viridans strep 1-16, pansensitive E.coli 1-26 and again pansensitive E coli on 2-13).  WIll repeat CBC on 04-08-13 (=day 12 cycle 1). If ANC <=1.2 would give neupogen (or substitute) 300 mcg 2-27, 2-28, and 3-2. Would repeat CBC on 3-2 if already neutropenic when neupogen begun.   Assessment/Plan:  1.IIIC2 high grade serous endometrial carcinoma in 80 yo lady with no other major medical problems: post TAH BSO pelvic and para-aortic node evaluation by Dr Josephina Shih 03-01-2013. First cycle q 3 week taxol carboplatin given 03-28-13, tolerated well, counts not yet at nadir.  RT after cycle 3 chemotherapy, then additional 3 cycles after RT. She will see Dr Josephina Shih 04-08-13, CBC and UA C&S that day. Possible neupogen beginning 2-27 as above. She will see Dr Owens Loffler on 04-15-13 with CBC prior to Cycle 2 on 3-9. Need to review times for premedication decadron prior to cycle 2. 2.UTIs pre and post op, last 2 cultures same. Symptoms resolved with antibiotics in past week. Repeat urine 2-27. 3. Flu vaccine done  4. Glaucoma  5.no mammograms in years. No colonoscopy. We will try to address these when possible, with priority at this point being recovery from surgery and initiation of therapy for the endometrial cancer.  6. prophylactic lovenox post op completed       Stephaie Dardis P, MD   04/04/2013, 3:03 PM

## 2013-04-04 NOTE — Telephone Encounter (Signed)
, °

## 2013-04-08 ENCOUNTER — Encounter: Payer: Self-pay | Admitting: Gynecology

## 2013-04-08 ENCOUNTER — Ambulatory Visit (HOSPITAL_BASED_OUTPATIENT_CLINIC_OR_DEPARTMENT_OTHER): Payer: Medicare Other

## 2013-04-08 ENCOUNTER — Encounter (INDEPENDENT_AMBULATORY_CARE_PROVIDER_SITE_OTHER): Payer: Self-pay

## 2013-04-08 ENCOUNTER — Other Ambulatory Visit: Payer: Self-pay

## 2013-04-08 ENCOUNTER — Ambulatory Visit: Payer: Medicare Other | Attending: Gynecology | Admitting: Gynecology

## 2013-04-08 ENCOUNTER — Other Ambulatory Visit (HOSPITAL_BASED_OUTPATIENT_CLINIC_OR_DEPARTMENT_OTHER): Payer: Medicare Other

## 2013-04-08 VITALS — BP 146/79 | HR 75 | Temp 98.4°F | Resp 16 | Ht 62.44 in | Wt 131.9 lb

## 2013-04-08 DIAGNOSIS — C549 Malignant neoplasm of corpus uteri, unspecified: Secondary | ICD-10-CM

## 2013-04-08 DIAGNOSIS — C7960 Secondary malignant neoplasm of unspecified ovary: Secondary | ICD-10-CM | POA: Insufficient documentation

## 2013-04-08 DIAGNOSIS — H409 Unspecified glaucoma: Secondary | ICD-10-CM | POA: Insufficient documentation

## 2013-04-08 DIAGNOSIS — Z5189 Encounter for other specified aftercare: Secondary | ICD-10-CM

## 2013-04-08 DIAGNOSIS — C541 Malignant neoplasm of endometrium: Secondary | ICD-10-CM

## 2013-04-08 DIAGNOSIS — Z90710 Acquired absence of both cervix and uterus: Secondary | ICD-10-CM | POA: Insufficient documentation

## 2013-04-08 DIAGNOSIS — D709 Neutropenia, unspecified: Secondary | ICD-10-CM

## 2013-04-08 DIAGNOSIS — Z9221 Personal history of antineoplastic chemotherapy: Secondary | ICD-10-CM | POA: Insufficient documentation

## 2013-04-08 DIAGNOSIS — C775 Secondary and unspecified malignant neoplasm of intrapelvic lymph nodes: Secondary | ICD-10-CM | POA: Insufficient documentation

## 2013-04-08 DIAGNOSIS — Z79899 Other long term (current) drug therapy: Secondary | ICD-10-CM | POA: Insufficient documentation

## 2013-04-08 DIAGNOSIS — Z923 Personal history of irradiation: Secondary | ICD-10-CM | POA: Insufficient documentation

## 2013-04-08 DIAGNOSIS — N390 Urinary tract infection, site not specified: Secondary | ICD-10-CM

## 2013-04-08 LAB — URINALYSIS, MICROSCOPIC - CHCC
BILIRUBIN (URINE): NEGATIVE
BLOOD: NEGATIVE
GLUCOSE UR CHCC: NEGATIVE mg/dL
Ketones: NEGATIVE mg/dL
Nitrite: NEGATIVE
PH: 5 (ref 4.6–8.0)
Protein: <30 mg/dL
SPECIFIC GRAVITY, URINE: 1.025 (ref 1.003–1.035)
Urobilinogen, UR: 0.2 mg/dL (ref 0.2–1)

## 2013-04-08 LAB — CBC WITH DIFFERENTIAL/PLATELET
BASO%: 1.3 % (ref 0.0–2.0)
Basophils Absolute: 0.1 10*3/uL (ref 0.0–0.1)
EOS%: 2 % (ref 0.0–7.0)
Eosinophils Absolute: 0.1 10*3/uL (ref 0.0–0.5)
HCT: 32.2 % — ABNORMAL LOW (ref 34.8–46.6)
HGB: 10.8 g/dL — ABNORMAL LOW (ref 11.6–15.9)
LYMPH#: 2.2 10*3/uL (ref 0.9–3.3)
LYMPH%: 55.3 % — ABNORMAL HIGH (ref 14.0–49.7)
MCH: 30.1 pg (ref 25.1–34.0)
MCHC: 33.4 g/dL (ref 31.5–36.0)
MCV: 90 fL (ref 79.5–101.0)
MONO#: 0.6 10*3/uL (ref 0.1–0.9)
MONO%: 16.2 % — ABNORMAL HIGH (ref 0.0–14.0)
NEUT#: 1 10*3/uL — ABNORMAL LOW (ref 1.5–6.5)
NEUT%: 25.2 % — ABNORMAL LOW (ref 38.4–76.8)
Platelets: 285 10*3/uL (ref 145–400)
RBC: 3.58 10*6/uL — AB (ref 3.70–5.45)
RDW: 13 % (ref 11.2–14.5)
WBC: 4 10*3/uL (ref 3.9–10.3)

## 2013-04-08 MED ORDER — TBO-FILGRASTIM 300 MCG/0.5ML ~~LOC~~ SOSY
300.0000 ug | PREFILLED_SYRINGE | Freq: Once | SUBCUTANEOUS | Status: AC
  Administered 2013-04-08: 300 ug via SUBCUTANEOUS
  Filled 2013-04-08: qty 0.5

## 2013-04-08 NOTE — Progress Notes (Signed)
Consult Note: Gyn-Onc   Ebony Herring 80 y.o. female  Chief Complaint  Patient presents with  . Endometrial Adeno    Follow up    Assessment : Stage III C. poorly differentiated endometrial carcinoma. Good postoperative recovery  Plan: Patient is given the okay to return to full levels of activity. She will continue chemotherapy to complete 3 cycles of carboplatin and Taxol. She will then received whole pelvis and extended field radiation therapy followed by 3 additional cycles of chemotherapy. I will plan on seeing the patient back again in late April. HPI: 80 year old white married female seen in consultation at the request of Dr. Gaetano Net regarding the management of a newly diagnosed high-grade endometrial carcinoma. The patient's history dates back to approximately November when she developed of vaginal discharge. Overtime some blood was noted in the discharge. Ultimately the patient was referred to Dr. Gertie Fey for further evaluation. An endometrial biopsy was obtained on 01/26/2013 showed a high-grade endometrial carcinoma with serous features. Subsequently the patient's had an MRI of the pelvis showing a fibroid uterus and prominent bilateral iliac lymph nodes measuring between 1 cm and 7 mm. Currently the patient is having minimal bleeding and no significant pain.  Past gynecologic history includes a bilateral tubal ligation. Obstetrical history gravida 2.  Patient underwent exploratory laparotomy total abdominal hysterectomy bilateral salpingo-oophorectomy pelvic and para-aortic lymphadenectomy on 03/01/2013. Final pathology showed a invasive high-grade serous carcinoma with extensive lymphatic invasion, metastasis to the right ovary and tube of 9 pelvic lymph nodes and 3 of 4 para-aortic lymph nodes. "Sandwich" therapy using carboplatin and Taxol and extended field radiation therapy was recommended.    Review of Systems:10 point review of systems is negative except as noted in interval  history.   Vitals: Blood pressure 146/79, pulse 75, temperature 98.4 F (36.9 C), resp. rate 16, height 5' 2.44" (1.586 m), weight 131 lb 14.4 oz (59.829 kg).  Physical Exam: General : The patient is a healthy woman in no acute distress.  HEENT: normocephalic, extraoccular movements normal; neck is supple without thyromegally  Lynphnodes: Supraclavicular and inguinal nodes not enlarged  Abdomen: Soft, non-tender, no ascites, no organomegally, no masses, no hernias she has a well-healed scar at McBurney's point and small incisions from her tubal ligation and laparoscopic cholecystectomy, incision is well-healed. Pelvic:  EGBUS: Normal female  Vagina: Normal, no lesions  Urethra and Bladder: Normal, non-tender  Cervix: Surgically absent Uterus: Surgically absent. Bi-manual examination: Non-tender; no adenxal masses or nodularity  Rectal: normal sphincter tone, no masses, no blood  Lower extremities: No edema or varicosities. Normal range of motion      Allergies  Allergen Reactions  . Bactrim [Sulfamethoxazole-Trimethoprim] Other (See Comments)    Made her see things  . Ciprofloxacin Other (See Comments)    Made her see things    Past Medical History  Diagnosis Date  . Glaucoma     High pressure- ?pre glaucoma  . Cancer     ENDOMETRIAL CANCER    Past Surgical History  Procedure Laterality Date  . Cholecystectomy    . Appendectomy    . Abdominal hysterectomy N/A 03/01/2013    Procedure: HYSTERECTOMY ABDOMINAL TOTAL/BILATERAL SALPINGO OOPHORECTOMY/WITH PELVIC AND PARA AORTIC LYMPHADNECTOMY ;  Surgeon: Alvino Chapel, MD;  Location: [facility];  Service: Gynecology;  Laterality: N/A;  . Salpingoophorectomy Bilateral 03/01/2013    Procedure: BILATERAL SALPINGO OOPHORECTOMY;  Surgeon: Alvino Chapel, MD;  Location: [facility];  Service: Gynecology;  Laterality: Bilateral;    Current Outpatient  Prescriptions  Medication Sig Dispense Refill  .  Brinzolamide-Brimonidine (SIMBRINZA) 1-0.2 % SUSP Place 1 drop into both eyes 2 (two) times daily.       . fish oil-omega-3 fatty acids 1000 MG capsule Take 1 g by mouth 2 (two) times daily.       Marland Kitchen dexamethasone (DECADRON) 4 MG tablet Take 5 tabs = 20 mg with food 12 hours and 6 hours prior to Taxol chemotherapy.  10 tablet  0  . LORazepam (ATIVAN) 0.5 MG tablet Take 1 tablet under the tongue or swallow every 6 hours as needed for nausea  20 tablet  0  . ondansetron (ZOFRAN) 8 MG tablet Take 1-2 tablets (8-16 mg total) by mouth every 12 (twelve) hours as needed for nausea or vomiting.  20 tablet  1  . oxyCODONE (OXY IR/ROXICODONE) 5 MG immediate release tablet Take 1 tablet (5 mg total) by mouth every 4 (four) hours as needed for moderate pain or severe pain.  30 tablet  0   No current facility-administered medications for this visit.   Facility-Administered Medications Ordered in Other Visits  Medication Dose Route Frequency Provider Last Rate Last Dose  . Tbo-Filgrastim (GRANIX) injection 300 mcg  300 mcg Subcutaneous Once Lennis Marion Downer, MD        History   Social History  . Marital Status: Married    Spouse Name: N/A    Number of Children: 1  . Years of Education: N/A   Occupational History  . Not on file.   Social History Main Topics  . Smoking status: Never Smoker   . Smokeless tobacco: Never Used  . Alcohol Use: No  . Drug Use: No  . Sexual Activity: Not on file   Other Topics Concern  . Not on file   Social History Narrative  . No narrative on file    Family History  Problem Relation Age of Onset  . Breast cancer Maternal Aunt     x 4      [name],Alahna Dunne L, MD 04/08/2013, 2:55 PM

## 2013-04-08 NOTE — Progress Notes (Signed)
Ebony Herring's ANC is 1.0 today. Pt to get Granix 300 mcg sq today;04-09-13; lab and injection 04-11-13. Orders placed along with POF to schedule appointments. Armanda Magic reviewed neutropenic precautions and directions for arrival on Sat. for injection.

## 2013-04-08 NOTE — Patient Instructions (Signed)
Plan to receive Neupogen injection today, tomorrow, and on 04/11/13.  You will follow up with Dr. Fermin Schwab in April 2015 or sooner if needed.  We will contact you with the results of your urine sample today.  Please call for any questions or concerns.   Filgrastim, G-CSF injection What is this medicine? FILGRASTIM, G-CSF (fil GRA stim) stimulates the formation of white blood cells. This medicine is given to patients with conditions that may cause a decrease in white blood cells, like those receiving certain types of chemotherapy or bone marrow transplant. It helps the bone marrow recover its ability to produce white blood cells. Increasing the amount of white blood cells helps to decrease the risk of infection and fever. This medicine may be used for other purposes; ask your health care provider or pharmacist if you have questions. COMMON BRAND NAME(S): Neupogen What should I tell my health care provider before I take this medicine? They need to know if you have any of these conditions: -currently receiving radiation therapy -sickle cell disease -an unusual or allergic reaction to filgrastim, E. coli protein, other medicines, foods, dyes, or preservatives -pregnant or trying to get pregnant -breast-feeding How should I use this medicine? This medicine is for injection into a vein or injection under the skin. It is usually given by a health care professional in a hospital or clinic setting. If you get this medicine at home, you will be taught how to prepare and give this medicine. Always change the site for the injection under the skin. Let the solution warm to room temperature before you use it. Do not shake the solution before you withdraw a dose. Throw away any unused portion. Use exactly as directed. Take your medicine at regular intervals. Do not take your medicine more often than directed. It is important that you put your used needles and syringes in a special sharps container. Do not put  them in a trash can. If you do not have a sharps container, call your pharmacist or healthcare provider to get one. Talk to your pediatrician regarding the use of this medicine in children. While this medicine may be prescribed for children for selected conditions, precautions do apply. Overdosage: If you think you have taken too much of this medicine contact a poison control center or emergency room at once. NOTE: This medicine is only for you. Do not share this medicine with others. What if I miss a dose? Try not to miss doses. If you miss a dose take the dose as soon as you remember. If it is almost time for the next dose, do not take double doses unless told to by your doctor or health care professional. What may interact with this medicine? -lithium -medicines for cancer chemotherapy This list may not describe all possible interactions. Give your health care provider a list of all the medicines, herbs, non-prescription drugs, or dietary supplements you use. Also tell them if you smoke, drink alcohol, or use illegal drugs. Some items may interact with your medicine. What should I watch for while using this medicine? Visit your doctor or health care professional for regular checks on your progress. If you get a fever or any sign of infection while you are using this medicine, do not treat yourself. Check with your doctor or health care professional. Bone pain can usually be relieved by mild pain relievers such as acetaminophen or ibuprofen. Check with your doctor or health care professional before taking these medicines as they may hide a fever.  Call your doctor or health care professional if the aches and pains are severe or do not go away. What side effects may I notice from receiving this medicine? Side effects that you should report to your doctor or health care professional as soon as possible: -allergic reactions like skin rash, itching or hives, swelling of the face, lips, or  tongue -difficulty breathing, wheezing -fever -pain, redness, or swelling at the injection site -stomach or side pain, or pain at the shoulder Side effects that usually do not require medical attention (report to your doctor or health care professional if they continue or are bothersome): -bone pain (ribs, lower back, breast bone) -headache -skin rash This list may not describe all possible side effects. Call your doctor for medical advice about side effects. You may report side effects to FDA at 1-800-FDA-1088. Where should I keep my medicine? Keep out of the reach of children. Store in a refrigerator between 2 and 8 degrees C (36 and 46 degrees F). Do not freeze or leave in direct sunlight. If vials or syringes are left out of the refrigerator for more than 24 hours, they must be thrown away. Throw away unused vials after the expiration date on the carton. NOTE: This sheet is a summary. It may not cover all possible information. If you have questions about this medicine, talk to your doctor, pharmacist, or health care provider.  2014, Elsevier/Gold Standard. (2007-04-14 13:33:21)

## 2013-04-09 ENCOUNTER — Ambulatory Visit (HOSPITAL_BASED_OUTPATIENT_CLINIC_OR_DEPARTMENT_OTHER): Payer: Medicare Other

## 2013-04-09 VITALS — BP 130/84 | HR 84 | Temp 99.4°F | Resp 20

## 2013-04-09 DIAGNOSIS — C549 Malignant neoplasm of corpus uteri, unspecified: Secondary | ICD-10-CM

## 2013-04-09 DIAGNOSIS — Z5189 Encounter for other specified aftercare: Secondary | ICD-10-CM

## 2013-04-09 MED ORDER — TBO-FILGRASTIM 300 MCG/0.5ML ~~LOC~~ SOSY
300.0000 ug | PREFILLED_SYRINGE | Freq: Once | SUBCUTANEOUS | Status: AC
  Administered 2013-04-09: 300 ug via SUBCUTANEOUS

## 2013-04-10 LAB — URINE CULTURE

## 2013-04-11 ENCOUNTER — Ambulatory Visit (HOSPITAL_BASED_OUTPATIENT_CLINIC_OR_DEPARTMENT_OTHER): Payer: Medicare Other

## 2013-04-11 VITALS — BP 128/68 | HR 82 | Temp 98.8°F

## 2013-04-11 DIAGNOSIS — C541 Malignant neoplasm of endometrium: Secondary | ICD-10-CM

## 2013-04-11 DIAGNOSIS — C549 Malignant neoplasm of corpus uteri, unspecified: Secondary | ICD-10-CM

## 2013-04-11 DIAGNOSIS — Z5189 Encounter for other specified aftercare: Secondary | ICD-10-CM

## 2013-04-11 MED ORDER — TBO-FILGRASTIM 300 MCG/0.5ML ~~LOC~~ SOSY
300.0000 ug | PREFILLED_SYRINGE | Freq: Once | SUBCUTANEOUS | Status: AC
  Administered 2013-04-11: 300 ug via SUBCUTANEOUS
  Filled 2013-04-11: qty 0.5

## 2013-04-11 NOTE — Patient Instructions (Signed)
Tbo-Filgrastim injection What is this medicine? TBO-FILGRASTIM is used to help decrease the time you have low amounts of white blood cells after cancer treatment. It helps the body make more white blood cells. Increasing the amount of white blood cells helps to decrease the risk of infection and fever. This medicine may be used for other purposes; ask your health care provider or pharmacist if you have questions. COMMON BRAND NAME(S): Granix What should I tell my health care provider before I take this medicine? They need to know if you have any of these conditions: -history of blood diseases, like sickle cell anemia or leukemia -an unusual or allergic reaction to tbo-filgrastim, filgrastim, pegfilgrastim, other medicines, foods, dyes, or preservatives -pregnant or trying to get pregnant -breast-feeding How should I use this medicine? This medicine is for injection under the skin. It is given by a health care professional in a hospital or clinic setting. Talk to your pediatrician regarding the use of this medicine in children. Special care may be needed. Overdosage: If you think you've taken too much of this medicine contact a poison control center or emergency room at once. Overdosage: If you think you have taken too much of this medicine contact a poison control center or emergency room at once. NOTE: This medicine is only for you. Do not share this medicine with others. What if I miss a dose? Keep appointments for follow-up doses as directed. It is important not to miss your dose. Call your doctor or health care professional if you are unable to keep an appointment. What may interact with this medicine? -lithium This list may not describe all possible interactions. Give your health care provider a list of all the medicines, herbs, non-prescription drugs, or dietary supplements you use. Also tell them if you smoke, drink alcohol, or use illegal drugs. Some items may interact with your  medicine. What should I watch for while using this medicine? Your condition will be monitored carefully while you are receiving this medicine. You may need blood work done while you are taking this medicine. Avoid taking products that contain aspirin, acetaminophen, ibuprofen, naproxen, or ketoprofen unless instructed by your doctor. These medicines may hide a fever. Call your doctor or health care professional for advice if you get a fever, chills or sore throat, or other symptoms of a cold or flu. Do not treat yourself. What side effects may I notice from receiving this medicine? Side effects that you should report to your doctor or health care professional as soon as possible: -allergic reactions like skin rash, itching or hives, swelling of the face, lips, or tongue -breathing problems -fever -stomach pain  Side effects that usually do not require medical attention (Report these to your doctor or health care professional if they continue or are bothersome.): -bone pain This list may not describe all possible side effects. Call your doctor for medical advice about side effects. You may report side effects to FDA at 1-800-FDA-1088. Where should I keep my medicine? This drug is given in a hospital or clinic and will not be stored at home. NOTE: This sheet is a summary. It may not cover all possible information. If you have questions about this medicine, talk to your doctor, pharmacist, or health care provider.  2014, Elsevier/Gold Standard. (2010-10-16 16:41:24)  

## 2013-04-12 ENCOUNTER — Telehealth: Payer: Self-pay

## 2013-04-12 ENCOUNTER — Other Ambulatory Visit: Payer: Self-pay

## 2013-04-12 DIAGNOSIS — N390 Urinary tract infection, site not specified: Secondary | ICD-10-CM

## 2013-04-12 DIAGNOSIS — C541 Malignant neoplasm of endometrium: Secondary | ICD-10-CM

## 2013-04-12 MED ORDER — NITROFURANTOIN MONOHYD MACRO 100 MG PO CAPS: 100.0000 mg | ORAL_CAPSULE | Freq: Two times a day (BID) | ORAL | Status: DC

## 2013-04-12 NOTE — Progress Notes (Signed)
Ordered follow up Urinalysis and culture for visit 04-28-13 to see if infection is cleared with another round of Macrobid.

## 2013-04-12 NOTE — Telephone Encounter (Signed)
Spoke with Ms. Leise and told her that she stills has a UTI from culture on 04-08-13.   Sent in another round of macrobid 100 mg bid x 7 days to her pharmacy per Joylene John NP.  Ms. Unrein verbalized understanding.

## 2013-04-14 ENCOUNTER — Other Ambulatory Visit: Payer: Self-pay

## 2013-04-14 DIAGNOSIS — C541 Malignant neoplasm of endometrium: Secondary | ICD-10-CM

## 2013-04-15 ENCOUNTER — Ambulatory Visit (HOSPITAL_BASED_OUTPATIENT_CLINIC_OR_DEPARTMENT_OTHER): Payer: Medicare Other | Admitting: Hematology and Oncology

## 2013-04-15 ENCOUNTER — Other Ambulatory Visit (HOSPITAL_BASED_OUTPATIENT_CLINIC_OR_DEPARTMENT_OTHER): Payer: Medicare Other

## 2013-04-15 ENCOUNTER — Telehealth: Payer: Self-pay

## 2013-04-15 VITALS — BP 126/69 | HR 76 | Temp 98.4°F | Resp 18 | Ht 62.44 in | Wt 130.2 lb

## 2013-04-15 DIAGNOSIS — C549 Malignant neoplasm of corpus uteri, unspecified: Secondary | ICD-10-CM

## 2013-04-15 DIAGNOSIS — C541 Malignant neoplasm of endometrium: Secondary | ICD-10-CM

## 2013-04-15 DIAGNOSIS — A498 Other bacterial infections of unspecified site: Secondary | ICD-10-CM

## 2013-04-15 DIAGNOSIS — N390 Urinary tract infection, site not specified: Secondary | ICD-10-CM

## 2013-04-15 LAB — CBC WITH DIFFERENTIAL/PLATELET
BASO%: 0.6 % (ref 0.0–2.0)
Basophils Absolute: 0.1 10*3/uL (ref 0.0–0.1)
EOS ABS: 0.2 10*3/uL (ref 0.0–0.5)
EOS%: 1.6 % (ref 0.0–7.0)
HCT: 32 % — ABNORMAL LOW (ref 34.8–46.6)
HGB: 10.5 g/dL — ABNORMAL LOW (ref 11.6–15.9)
LYMPH%: 23 % (ref 14.0–49.7)
MCH: 29.8 pg (ref 25.1–34.0)
MCHC: 32.8 g/dL (ref 31.5–36.0)
MCV: 91.1 fL (ref 79.5–101.0)
MONO#: 0.6 10*3/uL (ref 0.1–0.9)
MONO%: 5.5 % (ref 0.0–14.0)
NEUT%: 69.3 % (ref 38.4–76.8)
NEUTROS ABS: 7.5 10*3/uL — AB (ref 1.5–6.5)
Platelets: 200 10*3/uL (ref 145–400)
RBC: 3.51 10*6/uL — AB (ref 3.70–5.45)
RDW: 13.7 % (ref 11.2–14.5)
WBC: 10.8 10*3/uL — AB (ref 3.9–10.3)
lymph#: 2.5 10*3/uL (ref 0.9–3.3)

## 2013-04-15 LAB — COMPREHENSIVE METABOLIC PANEL (CC13)
ALBUMIN: 3.5 g/dL (ref 3.5–5.0)
ALT: 10 U/L (ref 0–55)
AST: 16 U/L (ref 5–34)
Alkaline Phosphatase: 100 U/L (ref 40–150)
Anion Gap: 10 mEq/L (ref 3–11)
BUN: 12.6 mg/dL (ref 7.0–26.0)
CO2: 24 mEq/L (ref 22–29)
Calcium: 8.5 mg/dL (ref 8.4–10.4)
Chloride: 108 mEq/L (ref 98–109)
Creatinine: 0.9 mg/dL (ref 0.6–1.1)
GLUCOSE: 103 mg/dL (ref 70–140)
Potassium: 4.1 mEq/L (ref 3.5–5.1)
Sodium: 142 mEq/L (ref 136–145)
Total Bilirubin: 0.2 mg/dL (ref 0.20–1.20)
Total Protein: 6.4 g/dL (ref 6.4–8.3)

## 2013-04-15 MED ORDER — DEXAMETHASONE 4 MG PO TABS: ORAL_TABLET | ORAL | Status: DC

## 2013-04-15 NOTE — Telephone Encounter (Signed)
Message copied by Baruch Merl on Fri Apr 15, 2013  2:54 PM ------      Message from: Gordy Levan      Created: Tue Apr 05, 2013  1:23 PM       See my note 2-23:            CBC with gyn onc visit 2-27. Parameters for neupogen which should start 2-27 if so. I did NOT put in orders for neupogen or injection apts.            UA  C&S 2-27 to be sure UTI gone            She will see Dr Owens Loffler 3-6. Please remind her times to take decadron 12 hrs/ 6 hrs before 3-9 taxol, with food.                  Cc LA, TH      Thanks                   ------

## 2013-04-15 NOTE — Telephone Encounter (Signed)
Reviewed with patient and husband to take Decadron pre medication 1230 on 04-18-13 and at 0630 with food.

## 2013-04-18 ENCOUNTER — Other Ambulatory Visit (HOSPITAL_BASED_OUTPATIENT_CLINIC_OR_DEPARTMENT_OTHER): Payer: Medicare Other

## 2013-04-18 ENCOUNTER — Other Ambulatory Visit: Payer: Self-pay

## 2013-04-18 ENCOUNTER — Other Ambulatory Visit: Payer: Self-pay | Admitting: Hematology and Oncology

## 2013-04-18 ENCOUNTER — Ambulatory Visit (HOSPITAL_BASED_OUTPATIENT_CLINIC_OR_DEPARTMENT_OTHER): Payer: Medicare Other

## 2013-04-18 VITALS — BP 149/71 | HR 84 | Temp 98.0°F

## 2013-04-18 DIAGNOSIS — C541 Malignant neoplasm of endometrium: Secondary | ICD-10-CM

## 2013-04-18 DIAGNOSIS — N390 Urinary tract infection, site not specified: Secondary | ICD-10-CM

## 2013-04-18 DIAGNOSIS — Z5111 Encounter for antineoplastic chemotherapy: Secondary | ICD-10-CM

## 2013-04-18 DIAGNOSIS — C549 Malignant neoplasm of corpus uteri, unspecified: Secondary | ICD-10-CM

## 2013-04-18 LAB — CBC WITH DIFFERENTIAL/PLATELET
BASO%: 0.1 % (ref 0.0–2.0)
Basophils Absolute: 0 10*3/uL (ref 0.0–0.1)
EOS%: 0.1 % (ref 0.0–7.0)
Eosinophils Absolute: 0 10*3/uL (ref 0.0–0.5)
HEMATOCRIT: 33 % — AB (ref 34.8–46.6)
HGB: 10.8 g/dL — ABNORMAL LOW (ref 11.6–15.9)
LYMPH%: 12.7 % — AB (ref 14.0–49.7)
MCH: 29.5 pg (ref 25.1–34.0)
MCHC: 32.7 g/dL (ref 31.5–36.0)
MCV: 90.2 fL (ref 79.5–101.0)
MONO#: 0.1 10*3/uL (ref 0.1–0.9)
MONO%: 0.6 % (ref 0.0–14.0)
NEUT#: 7 10*3/uL — ABNORMAL HIGH (ref 1.5–6.5)
NEUT%: 86.5 % — AB (ref 38.4–76.8)
PLATELETS: 157 10*3/uL (ref 145–400)
RBC: 3.66 10*6/uL — ABNORMAL LOW (ref 3.70–5.45)
RDW: 13.5 % (ref 11.2–14.5)
WBC: 8.1 10*3/uL (ref 3.9–10.3)
lymph#: 1 10*3/uL (ref 0.9–3.3)
nRBC: 0 % (ref 0–0)

## 2013-04-18 LAB — COMPREHENSIVE METABOLIC PANEL (CC13)
ALK PHOS: 97 U/L (ref 40–150)
ALT: 11 U/L (ref 0–55)
AST: 11 U/L (ref 5–34)
Albumin: 3.7 g/dL (ref 3.5–5.0)
Anion Gap: 14 mEq/L — ABNORMAL HIGH (ref 3–11)
BILIRUBIN TOTAL: 0.26 mg/dL (ref 0.20–1.20)
BUN: 15.4 mg/dL (ref 7.0–26.0)
CALCIUM: 9.3 mg/dL (ref 8.4–10.4)
CO2: 19 mEq/L — ABNORMAL LOW (ref 22–29)
CREATININE: 1 mg/dL (ref 0.6–1.1)
Chloride: 108 mEq/L (ref 98–109)
Glucose: 251 mg/dl — ABNORMAL HIGH (ref 70–140)
Potassium: 4.6 mEq/L (ref 3.5–5.1)
Sodium: 141 mEq/L (ref 136–145)
Total Protein: 7 g/dL (ref 6.4–8.3)

## 2013-04-18 MED ORDER — ONDANSETRON 16 MG/50ML IVPB (CHCC)
16.0000 mg | Freq: Once | INTRAVENOUS | Status: AC
  Administered 2013-04-18: 16 mg via INTRAVENOUS

## 2013-04-18 MED ORDER — AMOXICILLIN-POT CLAVULANATE 875-125 MG PO TABS: 1.0000 | ORAL_TABLET | Freq: Two times a day (BID) | ORAL | Status: DC

## 2013-04-18 MED ORDER — FAMOTIDINE IN NACL 20-0.9 MG/50ML-% IV SOLN
INTRAVENOUS | Status: AC
  Filled 2013-04-18: qty 50

## 2013-04-18 MED ORDER — DEXAMETHASONE SODIUM PHOSPHATE 20 MG/5ML IJ SOLN
20.0000 mg | Freq: Once | INTRAMUSCULAR | Status: AC
  Administered 2013-04-18: 20 mg via INTRAVENOUS

## 2013-04-18 MED ORDER — SODIUM CHLORIDE 0.9 % IV SOLN
175.0000 mg/m2 | Freq: Once | INTRAVENOUS | Status: AC
  Administered 2013-04-18: 288 mg via INTRAVENOUS
  Filled 2013-04-18: qty 48

## 2013-04-18 MED ORDER — DIPHENHYDRAMINE HCL 50 MG/ML IJ SOLN
25.0000 mg | Freq: Once | INTRAMUSCULAR | Status: AC
  Administered 2013-04-18: 25 mg via INTRAVENOUS

## 2013-04-18 MED ORDER — SODIUM CHLORIDE 0.9 % IV SOLN
Freq: Once | INTRAVENOUS | Status: AC
  Administered 2013-04-18: 13:00:00 via INTRAVENOUS

## 2013-04-18 MED ORDER — ONDANSETRON 16 MG/50ML IVPB (CHCC)
INTRAVENOUS | Status: AC
  Filled 2013-04-18: qty 16

## 2013-04-18 MED ORDER — SODIUM CHLORIDE 0.9 % IV SOLN
349.0000 mg | Freq: Once | INTRAVENOUS | Status: AC
  Administered 2013-04-18: 350 mg via INTRAVENOUS
  Filled 2013-04-18: qty 35

## 2013-04-18 MED ORDER — FAMOTIDINE IN NACL 20-0.9 MG/50ML-% IV SOLN
20.0000 mg | Freq: Once | INTRAVENOUS | Status: AC
Start: 2013-04-18 — End: 2013-04-18
  Administered 2013-04-18: 20 mg via INTRAVENOUS

## 2013-04-18 MED ORDER — DIPHENHYDRAMINE HCL 50 MG/ML IJ SOLN
INTRAMUSCULAR | Status: AC
  Filled 2013-04-18: qty 1

## 2013-04-18 MED ORDER — DEXAMETHASONE SODIUM PHOSPHATE 20 MG/5ML IJ SOLN
INTRAMUSCULAR | Status: AC
  Filled 2013-04-18: qty 5

## 2013-04-18 NOTE — Patient Instructions (Signed)
[facility] Discharge Instructions for Patients Receiving Chemotherapy  Today you received the following chemotherapy agents taxol/carboplatin  To help prevent nausea and vomiting after your treatment, we encourage you to take your nausea medication as directed   If you develop nausea and vomiting that is not controlled by your nausea medication, call the clinic.   BELOW ARE SYMPTOMS THAT SHOULD BE REPORTED IMMEDIATELY:  *FEVER GREATER THAN 100.5 F  *CHILLS WITH OR WITHOUT FEVER  NAUSEA AND VOMITING THAT IS NOT CONTROLLED WITH YOUR NAUSEA MEDICATION  *UNUSUAL SHORTNESS OF BREATH  *UNUSUAL BRUISING OR BLEEDING  TENDERNESS IN MOUTH AND THROAT WITH OR WITHOUT PRESENCE OF ULCERS  *URINARY PROBLEMS  *BOWEL PROBLEMS  UNUSUAL RASH Items with * indicate a potential emergency and should be followed up as soon as possible.  Feel free to call the clinic you have any questions or concerns. The clinic phone number is [phone].  

## 2013-04-19 ENCOUNTER — Encounter: Payer: Self-pay | Admitting: Hematology and Oncology

## 2013-04-19 ENCOUNTER — Other Ambulatory Visit: Payer: Self-pay

## 2013-04-19 NOTE — Progress Notes (Signed)
OFFICE PROGRESS NOTE   04/19/2013   Physicians:  INTERVAL HISTORY:  Patient is seen, together with daughter, in follow up of  Labs prior to cycle 2  of adjuvant taxol carboplatin  Cycle#1  given 03-28-13 for IIIC2 high grade endometrial cancer.  She has tolerated the first chemotherapy well  with minimal nausea x 1 resolved with prn anitemetic, some aching in knees now resolved, no significant fatigue, no peripheral neuropathy symptoms  She completed 7 day course of macrobid for E coli UTI on 04-03-13; she  had a repeat UA, C&S on 04-08-13 as this was third UTI since surgery.Patient appears concerned about the recurrent problems with UTI she had recently. Patient denies abdominal pain,fever,cough,vomiting,appetite and energy is good.She denies bladder symptoms.Regular BMs and no signs of bleeding.    She is to see Dr Josephina Shih on 04-08-13.  She does not have [name].   ONCOLOGIC HISTORY Patient presented to PCP with vaginal discharge with some bleeding since Nov 2014.  Dr Gaetano Net did endometrial biopsy 01-26-2013 which had high grade endometrial carcinoma with serous features. MRI pelvis 01-20-13 had uterine mass and prominent bilateral iliac nodes. She had TAH BSO and pelvic and para-aortic lymphadenectomy by Dr Josephina Shih on 03-01-2013. Pathology [phone]): high grade serous carcinoma of endometrium extending to outer half of myometrium, with extensive angiolymphatic invasion, involvement of right ovary, involvement of 2/9 pelvic nodes and 3/4 para-aortic nodes. At completion of surgery there was no gross residual disease. She was transfused 2 units of PRBCs on 03-05-13. She had small separation of upper portion of incision and erythema at lower portion of incision initially, both resolved prior to start of chemo. Perioperative course was complicated by UTIs. First carboplatin taxol was given 03-28-2013.     Review of systems as above, also: Bladder symptoms improved with nitrofurantoin  completed 04-03-13. No fever, no other symptoms of infection. Decreased taste sensation but still eating and drinking fluids. Bowels moving ~ every other day. No LE swelling. No increased SOB. Peripheral IV access not difficult.No pain.  Remainder of 10 point Review of Systems negative.  Objective:  Vital signs in last 24 hours:  BP 126/69  Pulse 76  Temp(Src) 98.4 F (36.9 C) (Oral)  Resp 18  Ht 5' 2.44" (1.586 m)  Wt 130 lb 3.2 oz (59.058 kg)  BMI 23.48 kg/m2 Weight is  Up 2  lbs. Alert, oriented and appropriate. Ambulatory without difficulty.  No alopecia  HEENT:PERRL, sclerae not icteric. Oral mucosa moist without lesions, posterior pharynx clear.  Neck supple. No JVD.  Lymphatics:no cervical,suraclavicular, axillary or inguinal adenopathy Resp: clear to auscultation bilaterally and normal percussion bilaterally Cardio: regular rate and rhythm. No gallop. GI: soft, nontender, not distended, no mass or organomegaly. Some bowel sounds. Surgical incision closed, no erythema, no tenderness. Musculoskeletal/ Extremities:  No  pitting edema, cords, tenderness Neuro: no peripheral neuropathy, nonfocal Skin without rash, ecchymosis, petechiae   Lab Results:  Results for orders placed in visit on 04/15/13  CBC WITH DIFFERENTIAL      Result Value Ref Range   WBC 10.8 (*) 3.9 - 10.3 10e3/uL   NEUT# 7.5 (*) 1.5 - 6.5 10e3/uL   HGB 10.5 (*) 11.6 - 15.9 g/dL   HCT 32.0 (*) 34.8 - 46.6 %   Platelets 200  145 - 400 10e3/uL   MCV 91.1  79.5 - 101.0 fL   MCH 29.8  25.1 - 34.0 pg   MCHC 32.8  31.5 - 36.0 g/dL   RBC 3.51 (*) 3.70 -  5.45 10e6/uL   RDW 13.7  11.2 - 14.5 %   lymph# 2.5  0.9 - 3.3 10e3/uL   MONO# 0.6  0.1 - 0.9 10e3/uL   Eosinophils Absolute 0.2  0.0 - 0.5 10e3/uL   Basophils Absolute 0.1  0.0 - 0.1 10e3/uL   NEUT% 69.3  38.4 - 76.8 %   LYMPH% 23.0  14.0 - 49.7 %   MONO% 5.5  0.0 - 14.0 %   EOS% 1.6  0.0 - 7.0 %   BASO% 0.6  0.0 - 2.0 %  COMPREHENSIVE METABOLIC PANEL  (JX91)      Result Value Ref Range   Sodium 142  136 - 145 mEq/L   Potassium 4.1  3.5 - 5.1 mEq/L   Chloride 108  98 - 109 mEq/L   CO2 24  22 - 29 mEq/L   Glucose 103  70 - 140 mg/dl   BUN 12.6  7.0 - 26.0 mg/dL   Creatinine 0.9  0.6 - 1.1 mg/dL   Total Bilirubin 0.20  0.20 - 1.20 mg/dL   Alkaline Phosphatase 100  40 - 150 U/L   AST 16  5 - 34 U/L   ALT 10  0 - 55 U/L   Total Protein 6.4  6.4 - 8.3 g/dL   Albumin 3.5  3.5 - 5.0 g/dL   Calcium 8.5  8.4 - 10.4 mg/dL   Anion Gap 10  3 - 11 mEq/L  Counts above have not nadired from chemo 2-16.    Studies/Results:  No results found.  Medications: I have reviewed the patient's current medications. Post op lovenox completed. Nitrofurantion completed last PM.  DISCUSSION: she understands plan for 3 cycles of chemotherapy before radiation, then 3 additional cycles of chemo after radiation    Assessment/Plan:  1.IIIC2 high grade serous endometrial carcinoma in 80 yo lady with no other major medical problems: post TAH BSO pelvic and para-aortic node evaluation by Dr Josephina Shih 03-01-2013. First cycle q 3 week taxol carboplatin given 03-28-13, tolerated well,   RT after cycle 3 chemotherapy, then additional 3 cycles after RT. She was seen by Dr Josephina Shih 04-08-13, CBC and UA C&S that day as above Patient received 3 days of Neupogen due to ANC,1.0 on 04/08/13.[place] now has recovered. Will proceed with cycle#2 Taxol/Carboplatin on 04/18/2013. Follow up with Dr.Livesay as scheduled on 04/25/13   2.UTIs pre and post op, last 2 cultures same E.coli. Symptoms resolved with antibiotics in past week.Repeat urinalysis and culture on 04-08-13 indicates E.coli still present despite recent course of Macrodantin. Will treat with Augmentin 875 mg 2 daily for 7 days  since she is  allergic to cipro and Bactrim. Will need repeat culture in the next  2-3 weeks.     3. Flu vaccine done  4. Glaucoma  5.no mammograms in years. No colonoscopy. We will try  to address these when possible, with priority at this point being recovery from surgery and initiation of therapy for the endometrial cancer.  6. prophylactic lovenox post op completed       Amada Kingfisher, MD Oncology/Hematology   04/19/2013, 12:37 PM   .

## 2013-04-24 ENCOUNTER — Other Ambulatory Visit: Payer: Self-pay | Admitting: Oncology

## 2013-04-28 ENCOUNTER — Other Ambulatory Visit: Payer: Self-pay

## 2013-04-28 ENCOUNTER — Ambulatory Visit: Payer: Medicare Other

## 2013-04-28 ENCOUNTER — Telehealth: Payer: Self-pay | Admitting: *Deleted

## 2013-04-28 ENCOUNTER — Other Ambulatory Visit (HOSPITAL_BASED_OUTPATIENT_CLINIC_OR_DEPARTMENT_OTHER): Payer: Medicare Other

## 2013-04-28 ENCOUNTER — Other Ambulatory Visit: Payer: Self-pay | Admitting: Pharmacist

## 2013-04-28 ENCOUNTER — Encounter: Payer: Self-pay | Admitting: Oncology

## 2013-04-28 ENCOUNTER — Ambulatory Visit (HOSPITAL_BASED_OUTPATIENT_CLINIC_OR_DEPARTMENT_OTHER): Payer: Medicare Other | Admitting: Oncology

## 2013-04-28 VITALS — BP 120/74 | HR 99 | Temp 98.4°F | Resp 18 | Ht 62.44 in | Wt 126.7 lb

## 2013-04-28 DIAGNOSIS — C541 Malignant neoplasm of endometrium: Secondary | ICD-10-CM

## 2013-04-28 DIAGNOSIS — C549 Malignant neoplasm of corpus uteri, unspecified: Secondary | ICD-10-CM

## 2013-04-28 DIAGNOSIS — N390 Urinary tract infection, site not specified: Secondary | ICD-10-CM

## 2013-04-28 DIAGNOSIS — Z5189 Encounter for other specified aftercare: Secondary | ICD-10-CM

## 2013-04-28 LAB — CBC WITH DIFFERENTIAL/PLATELET
BASO%: 0.8 % (ref 0.0–2.0)
Basophils Absolute: 0 10*3/uL (ref 0.0–0.1)
EOS%: 1.1 % (ref 0.0–7.0)
Eosinophils Absolute: 0 10*3/uL (ref 0.0–0.5)
HCT: 30 % — ABNORMAL LOW (ref 34.8–46.6)
HGB: 10 g/dL — ABNORMAL LOW (ref 11.6–15.9)
LYMPH%: 60.2 % — AB (ref 14.0–49.7)
MCH: 30 pg (ref 25.1–34.0)
MCHC: 33.4 g/dL (ref 31.5–36.0)
MCV: 89.9 fL (ref 79.5–101.0)
MONO#: 0.5 10*3/uL (ref 0.1–0.9)
MONO%: 11.7 % (ref 0.0–14.0)
NEUT#: 1.1 10*3/uL — ABNORMAL LOW (ref 1.5–6.5)
NEUT%: 26.2 % — ABNORMAL LOW (ref 38.4–76.8)
PLATELETS: 251 10*3/uL (ref 145–400)
RBC: 3.34 10*6/uL — AB (ref 3.70–5.45)
RDW: 14.1 % (ref 11.2–14.5)
WBC: 4 10*3/uL (ref 3.9–10.3)
lymph#: 2.4 10*3/uL (ref 0.9–3.3)

## 2013-04-28 LAB — COMPREHENSIVE METABOLIC PANEL (CC13)
ALK PHOS: 92 U/L (ref 40–150)
ALT: 12 U/L (ref 0–55)
AST: 14 U/L (ref 5–34)
Albumin: 3.8 g/dL (ref 3.5–5.0)
Anion Gap: 11 mEq/L (ref 3–11)
BUN: 15.2 mg/dL (ref 7.0–26.0)
CO2: 23 mEq/L (ref 22–29)
Calcium: 9 mg/dL (ref 8.4–10.4)
Chloride: 106 mEq/L (ref 98–109)
Creatinine: 0.8 mg/dL (ref 0.6–1.1)
Glucose: 98 mg/dl (ref 70–140)
Potassium: 3.7 mEq/L (ref 3.5–5.1)
SODIUM: 140 meq/L (ref 136–145)
TOTAL PROTEIN: 7.1 g/dL (ref 6.4–8.3)
Total Bilirubin: 0.56 mg/dL (ref 0.20–1.20)

## 2013-04-28 LAB — URINALYSIS, MICROSCOPIC - CHCC
Blood: NEGATIVE
GLUCOSE UR CHCC: NEGATIVE mg/dL
Nitrite: NEGATIVE
Protein: 30 mg/dL
RBC / HPF: NEGATIVE (ref 0–2)
SPECIFIC GRAVITY, URINE: 1.02 (ref 1.003–1.035)
UROBILINOGEN UR: 0.2 mg/dL (ref 0.2–1)
pH: 6 (ref 4.6–8.0)

## 2013-04-28 MED ORDER — DEXAMETHASONE 4 MG PO TABS: ORAL_TABLET | ORAL | Status: DC

## 2013-04-28 MED ORDER — TBO-FILGRASTIM 300 MCG/0.5ML ~~LOC~~ SOSY
300.0000 ug | PREFILLED_SYRINGE | Freq: Once | SUBCUTANEOUS | Status: AC
  Administered 2013-04-28: 300 ug via SUBCUTANEOUS
  Filled 2013-04-28: qty 0.5

## 2013-04-28 NOTE — Progress Notes (Signed)
OFFICE PROGRESS NOTE   04/28/2013   Physicians:Daniel ClarkePearson, [place], Lonell Grandchild F (PCP [facility]), Gery Pray, Elisabeth Pigeon (ophth)   INTERVAL HISTORY:  Patient is seen, alone for visit, in scheduled follow up of adjuvant taxol carboplatin in process for IIIC2 high grade endometrial carcinoma, having had cycle 2 on 04-18-13. ANC nadir was 1.0 with cycle 1, such that gCSF is appropriate this cycle and for subsequent cycles. Plan is for radiation by Dr Sondra Come after cycle 3, which is due 05-09-13. She is to see Dr Josephina Shih again on 05-20-13.  Patient tells me that peripheral IV access has been somewhat challenging, but she prefers this to central catheter for now. She denies taxol aches or significant nausea, tho she cannot tolerate food odors  (in evenings only) - discussed. Bowels moving. No fever or symptoms of infection.    NOTE she has known [facility] RN Royce Macadamia for 30+ years   ONCOLOGIC HISTORY Patient presented to PCP with vaginal discharge with some bleeding since Nov 2014. 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 MRI pelvis 01-20-13 Had uterine mass and prominent bilateral iliac nodes. She had TAH BSO and pelvic and para-aortic lymphadenectomy by Dr Josephina Shih on 03-01-2013. Pathology [phone]) had high grade serous carcinoma of endometrium extending to outer half of myometrium, with extensive angiolymphatic invasion, involvement of right ovary, involvement of 2/9 pelvic nodes and 3/4 para-aortic nodes. At completion of surgery there was no gross residual disease. She was transfused 2 units of PRBCs on 03-05-13. She had small separation of upper portion of incision and erythema at lower portion of incision initially, both resolved prior to start of chemo. She had E.coli UTI 03-07-13.    Review of systems as above, also: No SOB. No peripheral neuropathy. No bleeding. No  abdominal or pelvic discomfort. Usual weight is ~ 125, so present weight is essentially baseline. Remainder of 10 point Review of Systems negative.  Objective:  Vital signs in last 24 hours:  BP 120/74  Pulse 99  Temp(Src) 98.4 F (36.9 C) (Oral)  Resp 18  Ht 5' 2.44" (1.586 m)  Wt 126 lb 11.2 oz (57.471 kg)  BMI 22.85 kg/m2  Weight is down 3 lbs.  Alert, oriented and appropriate. Ambulatory without difficulty.  Hair is thinning.  HEENT:PERRL, sclerae not icteric. Oral mucosa moist without lesions, posterior pharynx clear.  Neck supple. No JVD.  Lymphatics:no cervical,suraclavicular, axillary or inguinal adenopathy Resp: clear to auscultation bilaterally and normal percussion bilaterally Cardio: regular rate and rhythm. No gallop. GI: soft, nontender, not distended, no mass or organomegaly. Some bowel sounds. Surgical incision not remarkable. Musculoskeletal/ Extremities: without pitting edema, cords, tenderness Neuro: no peripheral neuropathy. Otherwise nonfocal. Psych: mood and affect appropriate. Skin without rash or petechiae. Small resolving ecchymoses at sites of IV access attempts RUE, no cords, erythema, tenderness.   Lab Results:  Results for orders placed in visit on 04/28/13  CBC WITH DIFFERENTIAL      Result Value Ref Range   WBC 4.0  3.9 - 10.3 10e3/uL   NEUT# 1.1 (*) 1.5 - 6.5 10e3/uL   HGB 10.0 (*) 11.6 - 15.9 g/dL   HCT 30.0 (*) 34.8 - 46.6 %   Platelets 251  145 - 400 10e3/uL   MCV 89.9  79.5 - 101.0 fL   MCH 30.0  25.1 - 34.0 pg   MCHC 33.4  31.5 - 36.0 g/dL   RBC 3.34 (*) 3.70 -  5.45 10e6/uL   RDW 14.1  11.2 - 14.5 %   lymph# 2.4  0.9 - 3.3 10e3/uL   MONO# 0.5  0.1 - 0.9 10e3/uL   Eosinophils Absolute 0.0  0.0 - 0.5 10e3/uL   Basophils Absolute 0.0  0.0 - 0.1 10e3/uL   NEUT% 26.2 (*) 38.4 - 76.8 %   LYMPH% 60.2 (*) 14.0 - 49.7 %   MONO% 11.7  0.0 - 14.0 %   EOS% 1.1  0.0 - 7.0 %   BASO% 0.8  0.0 - 2.0 %  COMPREHENSIVE METABOLIC PANEL (OA41)       Result Value Ref Range   Sodium 140  136 - 145 mEq/L   Potassium 3.7  3.5 - 5.1 mEq/L   Chloride 106  98 - 109 mEq/L   CO2 23  22 - 29 mEq/L   Glucose 98  70 - 140 mg/dl   BUN 15.2  7.0 - 26.0 mg/dL   Creatinine 0.8  0.6 - 1.1 mg/dL   Total Bilirubin 0.56  0.20 - 1.20 mg/dL   Alkaline Phosphatase 92  40 - 150 U/L   AST 14  5 - 34 U/L   ALT 12  0 - 55 U/L   Total Protein 7.1  6.4 - 8.3 g/dL   Albumin 3.8  3.5 - 5.0 g/dL   Calcium 9.0  8.4 - 10.4 mg/dL   Anion Gap 11  3 - 11 mEq/L  URINALYSIS, MICROSCOPIC - [facility]      Result Value Ref Range   Glucose Negative  Negative mg/dL   Bilirubin (Urine) Color Interference  Negative   Ketones Color Interference  Negative mg/dL   Specific Gravity, Urine 1.020  1.003 - 1.035   Blood Negative  Negative   pH 6.0  4.6 - 8.0   Protein 30  Negative- <30 mg/dL   Urobilinogen, UR 0.2  0.2 - 1 mg/dL   Nitrite Negative  Negative   Leukocyte Esterase Trace  Negative   RBC / HPF Negative  0 - 2   WBC, UA 3-6  0 - 2   Bacteria, UA Trace  Negative- Trace   Casts Granular, rare  Negative   Epithelial Cells Moderate  Negative- Few   Mucus, UA Large  Negative- Small   COMMENTS: CI=Color Interference, recent h/o multiple UYI's  Negative     Studies/Results:  No results found.  Medications: I have reviewed the patient's current medications. She will have gCSF 300 mcg today and 3-20, then x 3 days beginning day 2 cycle 3.    Assessment/Plan: 1.IIIC2 high grade serous endometrial carcinoma in 80 yo lady with no other major medical problems: post TAH BSO pelvic and para-aortic node evaluation by Dr Josephina Shih 03-01-2013. First cycle q 3 week taxol carboplatin given 03-28-13 and for cycle 3 on 05-09-13 as long as [place] >=1.5 and plt >=100k. gCSF as above. I will let Dr Sondra Come know chemo schedule as above, anticipating RT to start after cycle 3. I will see her with labs ~ 2 weeks after cycle 3 also. 2.UTIs pre and post op: UA today does not suggest ongoing  infection. 3. Difficult IV access, tho probably can be managed for now. DIFFICULT IV ACCESS noted in nursing information with chemo orders  4. Glaucoma  5.no mammograms in years. No colonoscopy. We will try to address these when possible,     Patient seems to follow discussion adequately and is in agreement with plan above. I will ask RN to remind  her of premed decadron schedule prior to cycle 3.   LIVESAY,LENNIS P, MD   04/28/2013, 1:56 PM

## 2013-04-28 NOTE — Telephone Encounter (Signed)
appts made and printed...td 

## 2013-04-29 ENCOUNTER — Ambulatory Visit (HOSPITAL_BASED_OUTPATIENT_CLINIC_OR_DEPARTMENT_OTHER): Payer: Medicare Other

## 2013-04-29 ENCOUNTER — Other Ambulatory Visit: Payer: Self-pay | Admitting: Oncology

## 2013-04-29 VITALS — BP 113/64 | HR 90 | Temp 98.6°F

## 2013-04-29 DIAGNOSIS — Z5189 Encounter for other specified aftercare: Secondary | ICD-10-CM

## 2013-04-29 DIAGNOSIS — C549 Malignant neoplasm of corpus uteri, unspecified: Secondary | ICD-10-CM

## 2013-04-29 DIAGNOSIS — C541 Malignant neoplasm of endometrium: Secondary | ICD-10-CM

## 2013-04-29 MED ORDER — TBO-FILGRASTIM 300 MCG/0.5ML ~~LOC~~ SOSY
300.0000 ug | PREFILLED_SYRINGE | Freq: Once | SUBCUTANEOUS | Status: AC
  Administered 2013-04-29: 300 ug via SUBCUTANEOUS
  Filled 2013-04-29: qty 0.5

## 2013-04-29 NOTE — Patient Instructions (Signed)
Tbo-Filgrastim injection What is this medicine? TBO-FILGRASTIM is used to help decrease the time you have low amounts of white blood cells after cancer treatment. It helps the body make more white blood cells. Increasing the amount of white blood cells helps to decrease the risk of infection and fever. This medicine may be used for other purposes; ask your health care provider or pharmacist if you have questions. COMMON BRAND NAME(S): Granix What should I tell my health care provider before I take this medicine? They need to know if you have any of these conditions: -history of blood diseases, like sickle cell anemia or leukemia -an unusual or allergic reaction to tbo-filgrastim, filgrastim, pegfilgrastim, other medicines, foods, dyes, or preservatives -pregnant or trying to get pregnant -breast-feeding How should I use this medicine? This medicine is for injection under the skin. It is given by a health care professional in a hospital or clinic setting. Talk to your pediatrician regarding the use of this medicine in children. Special care may be needed. Overdosage: If you think you've taken too much of this medicine contact a poison control center or emergency room at once. Overdosage: If you think you have taken too much of this medicine contact a poison control center or emergency room at once. NOTE: This medicine is only for you. Do not share this medicine with others. What if I miss a dose? Keep appointments for follow-up doses as directed. It is important not to miss your dose. Call your doctor or health care professional if you are unable to keep an appointment. What may interact with this medicine? -lithium This list may not describe all possible interactions. Give your health care provider a list of all the medicines, herbs, non-prescription drugs, or dietary supplements you use. Also tell them if you smoke, drink alcohol, or use illegal drugs. Some items may interact with your  medicine. What should I watch for while using this medicine? Your condition will be monitored carefully while you are receiving this medicine. You may need blood work done while you are taking this medicine. Avoid taking products that contain aspirin, acetaminophen, ibuprofen, naproxen, or ketoprofen unless instructed by your doctor. These medicines may hide a fever. Call your doctor or health care professional for advice if you get a fever, chills or sore throat, or other symptoms of a cold or flu. Do not treat yourself. What side effects may I notice from receiving this medicine? Side effects that you should report to your doctor or health care professional as soon as possible: -allergic reactions like skin rash, itching or hives, swelling of the face, lips, or tongue -breathing problems -fever -stomach pain  Side effects that usually do not require medical attention (Report these to your doctor or health care professional if they continue or are bothersome.): -bone pain This list may not describe all possible side effects. Call your doctor for medical advice about side effects. You may report side effects to FDA at 1-800-FDA-1088. Where should I keep my medicine? This drug is given in a hospital or clinic and will not be stored at home. NOTE: This sheet is a summary. It may not cover all possible information. If you have questions about this medicine, talk to your doctor, pharmacist, or health care provider.  2014, Elsevier/Gold Standard. (2010-10-16 16:41:24)  

## 2013-05-02 ENCOUNTER — Telehealth: Payer: Self-pay | Admitting: Oncology

## 2013-05-02 ENCOUNTER — Telehealth: Payer: Self-pay

## 2013-05-02 NOTE — Telephone Encounter (Signed)
Spoke with Ms. January and told her the times to take decadron on 05-09-23 at 11pm and 0500 05-09-13 with food.  Reviewed  Rational for 3 days of neupogen and dates and times.  [name] verbalized understanding.

## 2013-05-02 NOTE — Telephone Encounter (Signed)
added lab for 3/26 per 2nd 3/19 pof and 3/26 order. s/w pt she is aware. other appts remain the same.

## 2013-05-02 NOTE — Telephone Encounter (Signed)
Message copied by Baruch Merl on Mon May 02, 2013  2:39 PM ------      Message from: Gordy Levan      Created: Sat Apr 30, 2013 10:23 PM       Please go over times for premed decadron for chemo due 05-09-13, and neupogen shots after that chemo.      Thanks            Cc LA, TH ------

## 2013-05-05 ENCOUNTER — Other Ambulatory Visit (HOSPITAL_BASED_OUTPATIENT_CLINIC_OR_DEPARTMENT_OTHER): Payer: Medicare Other

## 2013-05-05 DIAGNOSIS — N390 Urinary tract infection, site not specified: Secondary | ICD-10-CM

## 2013-05-05 LAB — URINALYSIS, MICROSCOPIC - CHCC
BLOOD: NEGATIVE
Bilirubin (Urine): NEGATIVE
GLUCOSE UR CHCC: NEGATIVE mg/dL
Ketones: NEGATIVE mg/dL
NITRITE: NEGATIVE
PH: 7 (ref 4.6–8.0)
RBC / HPF: NEGATIVE (ref 0–2)
Specific Gravity, Urine: 1.01 (ref 1.003–1.035)
UROBILINOGEN UR: 0.2 mg/dL (ref 0.2–1)

## 2013-05-06 ENCOUNTER — Other Ambulatory Visit: Payer: Self-pay

## 2013-05-06 LAB — URINE CULTURE

## 2013-05-08 ENCOUNTER — Other Ambulatory Visit: Payer: Self-pay | Admitting: Oncology

## 2013-05-08 DIAGNOSIS — C541 Malignant neoplasm of endometrium: Secondary | ICD-10-CM

## 2013-05-09 ENCOUNTER — Other Ambulatory Visit: Payer: Self-pay | Admitting: Oncology

## 2013-05-09 ENCOUNTER — Other Ambulatory Visit (HOSPITAL_BASED_OUTPATIENT_CLINIC_OR_DEPARTMENT_OTHER): Payer: Medicare Other

## 2013-05-09 ENCOUNTER — Ambulatory Visit (HOSPITAL_BASED_OUTPATIENT_CLINIC_OR_DEPARTMENT_OTHER): Payer: Medicare Other

## 2013-05-09 ENCOUNTER — Ambulatory Visit: Payer: Medicare Other

## 2013-05-09 VITALS — BP 169/81 | HR 97 | Temp 98.3°F

## 2013-05-09 DIAGNOSIS — C541 Malignant neoplasm of endometrium: Secondary | ICD-10-CM

## 2013-05-09 DIAGNOSIS — C549 Malignant neoplasm of corpus uteri, unspecified: Secondary | ICD-10-CM

## 2013-05-09 DIAGNOSIS — Z5111 Encounter for antineoplastic chemotherapy: Secondary | ICD-10-CM

## 2013-05-09 LAB — CBC WITH DIFFERENTIAL/PLATELET
BASO%: 0.1 % (ref 0.0–2.0)
Basophils Absolute: 0 10*3/uL (ref 0.0–0.1)
EOS ABS: 0 10*3/uL (ref 0.0–0.5)
EOS%: 0 % (ref 0.0–7.0)
HEMATOCRIT: 31.9 % — AB (ref 34.8–46.6)
HGB: 10.2 g/dL — ABNORMAL LOW (ref 11.6–15.9)
LYMPH%: 12.6 % — AB (ref 14.0–49.7)
MCH: 29.4 pg (ref 25.1–34.0)
MCHC: 32 g/dL (ref 31.5–36.0)
MCV: 91.9 fL (ref 79.5–101.0)
MONO#: 0 10*3/uL — ABNORMAL LOW (ref 0.1–0.9)
MONO%: 0.4 % (ref 0.0–14.0)
NEUT#: 6.5 10*3/uL (ref 1.5–6.5)
NEUT%: 86.9 % — AB (ref 38.4–76.8)
Platelets: 292 10*3/uL (ref 145–400)
RBC: 3.47 10*6/uL — ABNORMAL LOW (ref 3.70–5.45)
RDW: 15.6 % — ABNORMAL HIGH (ref 11.2–14.5)
WBC: 7.5 10*3/uL (ref 3.9–10.3)
lymph#: 1 10*3/uL (ref 0.9–3.3)
nRBC: 0 % (ref 0–0)

## 2013-05-09 MED ORDER — FAMOTIDINE IN NACL 20-0.9 MG/50ML-% IV SOLN
20.0000 mg | Freq: Once | INTRAVENOUS | Status: AC
  Administered 2013-05-09: 20 mg via INTRAVENOUS

## 2013-05-09 MED ORDER — ONDANSETRON 16 MG/50ML IVPB (CHCC)
16.0000 mg | Freq: Once | INTRAVENOUS | Status: AC
  Administered 2013-05-09: 16 mg via INTRAVENOUS

## 2013-05-09 MED ORDER — SODIUM CHLORIDE 0.9 % IV SOLN
175.0000 mg/m2 | Freq: Once | INTRAVENOUS | Status: AC
  Administered 2013-05-09: 288 mg via INTRAVENOUS
  Filled 2013-05-09: qty 48

## 2013-05-09 MED ORDER — FAMOTIDINE IN NACL 20-0.9 MG/50ML-% IV SOLN
INTRAVENOUS | Status: AC
  Filled 2013-05-09: qty 50

## 2013-05-09 MED ORDER — DEXAMETHASONE SODIUM PHOSPHATE 20 MG/5ML IJ SOLN
20.0000 mg | Freq: Once | INTRAMUSCULAR | Status: AC
  Administered 2013-05-09: 20 mg via INTRAVENOUS

## 2013-05-09 MED ORDER — DEXAMETHASONE SODIUM PHOSPHATE 20 MG/5ML IJ SOLN
INTRAMUSCULAR | Status: AC
  Filled 2013-05-09: qty 5

## 2013-05-09 MED ORDER — SODIUM CHLORIDE 0.9 % IV SOLN
Freq: Once | INTRAVENOUS | Status: AC
  Administered 2013-05-09: 11:00:00 via INTRAVENOUS

## 2013-05-09 MED ORDER — DIPHENHYDRAMINE HCL 50 MG/ML IJ SOLN
25.0000 mg | Freq: Once | INTRAMUSCULAR | Status: AC
  Administered 2013-05-09: 25 mg via INTRAVENOUS

## 2013-05-09 MED ORDER — ONDANSETRON 16 MG/50ML IVPB (CHCC)
INTRAVENOUS | Status: AC
  Filled 2013-05-09: qty 16

## 2013-05-09 MED ORDER — DIPHENHYDRAMINE HCL 50 MG/ML IJ SOLN
INTRAMUSCULAR | Status: AC
  Filled 2013-05-09: qty 1

## 2013-05-09 MED ORDER — SODIUM CHLORIDE 0.9 % IV SOLN
350.0000 mg | Freq: Once | INTRAVENOUS | Status: AC
  Administered 2013-05-09: 350 mg via INTRAVENOUS
  Filled 2013-05-09: qty 35

## 2013-05-09 NOTE — Patient Instructions (Signed)
[facility] Discharge Instructions for Patients Receiving Chemotherapy  Today you received the following chemotherapy agents taxol/carboplatin  To help prevent nausea and vomiting after your treatment, we encourage you to take your nausea medication as directed   If you develop nausea and vomiting that is not controlled by your nausea medication, call the clinic.   BELOW ARE SYMPTOMS THAT SHOULD BE REPORTED IMMEDIATELY:  *FEVER GREATER THAN 100.5 F  *CHILLS WITH OR WITHOUT FEVER  NAUSEA AND VOMITING THAT IS NOT CONTROLLED WITH YOUR NAUSEA MEDICATION  *UNUSUAL SHORTNESS OF BREATH  *UNUSUAL BRUISING OR BLEEDING  TENDERNESS IN MOUTH AND THROAT WITH OR WITHOUT PRESENCE OF ULCERS  *URINARY PROBLEMS  *BOWEL PROBLEMS  UNUSUAL RASH Items with * indicate a potential emergency and should be followed up as soon as possible.  Feel free to call the clinic you have any questions or concerns. The clinic phone number is [phone].  

## 2013-05-10 ENCOUNTER — Ambulatory Visit (HOSPITAL_BASED_OUTPATIENT_CLINIC_OR_DEPARTMENT_OTHER): Payer: Medicare Other

## 2013-05-10 VITALS — BP 125/67 | HR 75 | Temp 98.4°F

## 2013-05-10 DIAGNOSIS — C541 Malignant neoplasm of endometrium: Secondary | ICD-10-CM

## 2013-05-10 DIAGNOSIS — Z5189 Encounter for other specified aftercare: Secondary | ICD-10-CM

## 2013-05-10 DIAGNOSIS — C549 Malignant neoplasm of corpus uteri, unspecified: Secondary | ICD-10-CM

## 2013-05-10 MED ORDER — TBO-FILGRASTIM 300 MCG/0.5ML ~~LOC~~ SOSY
300.0000 ug | PREFILLED_SYRINGE | Freq: Once | SUBCUTANEOUS | Status: AC
  Administered 2013-05-10: 300 ug via SUBCUTANEOUS
  Filled 2013-05-10: qty 0.5

## 2013-05-11 ENCOUNTER — Ambulatory Visit (HOSPITAL_BASED_OUTPATIENT_CLINIC_OR_DEPARTMENT_OTHER): Payer: Medicare Other

## 2013-05-11 VITALS — BP 112/63 | HR 88 | Temp 98.3°F

## 2013-05-11 DIAGNOSIS — Z5189 Encounter for other specified aftercare: Secondary | ICD-10-CM

## 2013-05-11 DIAGNOSIS — C541 Malignant neoplasm of endometrium: Secondary | ICD-10-CM

## 2013-05-11 DIAGNOSIS — C549 Malignant neoplasm of corpus uteri, unspecified: Secondary | ICD-10-CM

## 2013-05-11 MED ORDER — TBO-FILGRASTIM 300 MCG/0.5ML ~~LOC~~ SOSY
300.0000 ug | PREFILLED_SYRINGE | Freq: Once | SUBCUTANEOUS | Status: AC
  Administered 2013-05-11: 300 ug via SUBCUTANEOUS
  Filled 2013-05-11: qty 0.5

## 2013-05-12 ENCOUNTER — Ambulatory Visit (HOSPITAL_BASED_OUTPATIENT_CLINIC_OR_DEPARTMENT_OTHER): Payer: Medicare Other

## 2013-05-12 VITALS — BP 118/64 | HR 95 | Temp 98.3°F

## 2013-05-12 DIAGNOSIS — Z5189 Encounter for other specified aftercare: Secondary | ICD-10-CM

## 2013-05-12 DIAGNOSIS — C541 Malignant neoplasm of endometrium: Secondary | ICD-10-CM

## 2013-05-12 DIAGNOSIS — C549 Malignant neoplasm of corpus uteri, unspecified: Secondary | ICD-10-CM

## 2013-05-12 MED ORDER — TBO-FILGRASTIM 300 MCG/0.5ML ~~LOC~~ SOSY
300.0000 ug | PREFILLED_SYRINGE | Freq: Once | SUBCUTANEOUS | Status: AC
  Administered 2013-05-12: 300 ug via SUBCUTANEOUS
  Filled 2013-05-12: qty 0.5

## 2013-05-18 NOTE — Progress Notes (Signed)
GYN Location of Tumor / Histology: high-grade endometrial carcinoma   Patient presented with symptoms of: increased vaginal discharge and bleeding   Biopsies of revealed:   Diagnosis  1. Uterus, ovaries and fallopian tubes, with cervix  - INVASIVE HIGH GRADE SEROUS CARCINOMA WITH ASSOCIATED EXTENSIVE LYMPHATIC  INVASION, EXTENDING TO OUTER HALF OF THE MYOMETRIUM.  - RIGHT OVARY: METASTATIC SEROUS CARCINOMA WITH EXTENSIVE ANGIOLYMPHATIC  INVASION PRESENT.  - MYOMETRIUM: LEIOMYOMATA AND ADENOMYOSIS.  - CERVIX: BENIGN SQUAMOUS MUCOSA AND ENDOCERVICAL MUCOSA, NO DYSPLASIA OR  MALIGNANCY.  - LEFT OVARY AND FALLOPIAN TUBES: BENIGN OVARIAN TISSUE AND BILATERAL FALLOPIAN  TUBAL TISSUE, NO EVIDENCE OF MALIGNANCY.  2. Lymph nodes, regional resection, left pelvic  - ONE OF FIVE LYMPH NODES, POSITIVE FOR METASTATIC CARCINOMA (1/5).  3. Lymph nodes, regional resection, right pelvic  - ONE OF FOUR LYMPH NODES, POSITIVE FOR METASTATIC CARCINOMA (1/4)  4. Lymph nodes, regional resection, aortic  - THREE OF FOUR LYMPH NODES, POSITIVE FOR METASTATIC CARCINOMA(3/4)   Past/Anticipated interventions by Gyn/Onc surgery, if any: 03/01/13 - Procedure: HYSTERECTOMY ABDOMINAL TOTAL/BILATERAL SALPINGO OOPHORECTOMY/WITH PELVIC AND PARA AORTIC LYMPHADNECTOMY ; Surgeon: Alvino Chapel, MD; Location: [facility]; Service: Gynecology; Laterality: N/A; and Procedure: BILATERAL SALPINGO OOPHORECTOMY; Surgeon: Alvino Chapel, MD; Location: [facility]; Service: Gynecology; Laterality: Bilateral   Past/Anticipated interventions by medical oncology, if any: Recommendations for additional treatment in 'sandwich' fashion with chemotherapy under the care of Dr. Marko Plume to begin three to four weeks post-operatively, tentatively on 2/16, followed by radiation under the care of Dr. Sondra Come, with three cycles of chemotherapy to follow.  First cycle q 3 week taxol carboplatin given 03-28-13 and for cycle 3 on 05-09-13.  Weight  changes, if any: no   Bowel/Bladder complaints, if any: some constipation resolved by taking a laxative.  Nausea/Vomiting, if any: no   Pain issues, if any: no   SAFETY ISSUES:  Prior radiation? no  Pacemaker/ICD? no  Possible current pregnancy? no  Is the patient on methotrexate? No  Current Complaints / other details: Has 1 son and had another son die in a car accident at 29.  Has had 3 rounds of chemotherapy which she tolerated well.  She reported that the only thing she experienced was a poor appetite.  Her weight it up 2 lbs now.

## 2013-05-19 ENCOUNTER — Ambulatory Visit
Admission: RE | Admit: 2013-05-19 | Discharge: 2013-05-19 | Disposition: A | Discharge status: Home / Self Care | Payer: Medicare Other | Source: Ambulatory Visit | Attending: Radiation Oncology | Admitting: Radiation Oncology

## 2013-05-19 ENCOUNTER — Encounter: Payer: Self-pay | Admitting: Radiation Oncology

## 2013-05-19 VITALS — BP 133/68 | HR 79 | Temp 99.0°F | Ht 63.5 in | Wt 128.2 lb

## 2013-05-19 DIAGNOSIS — R5381 Other malaise: Secondary | ICD-10-CM | POA: Insufficient documentation

## 2013-05-19 DIAGNOSIS — R197 Diarrhea, unspecified: Secondary | ICD-10-CM | POA: Diagnosis not present

## 2013-05-19 DIAGNOSIS — R5383 Other fatigue: Secondary | ICD-10-CM

## 2013-05-19 DIAGNOSIS — Z510 Encounter for antineoplastic radiation therapy: Secondary | ICD-10-CM | POA: Insufficient documentation

## 2013-05-19 DIAGNOSIS — Z79899 Other long term (current) drug therapy: Secondary | ICD-10-CM | POA: Insufficient documentation

## 2013-05-19 DIAGNOSIS — C541 Malignant neoplasm of endometrium: Secondary | ICD-10-CM

## 2013-05-19 DIAGNOSIS — C549 Malignant neoplasm of corpus uteri, unspecified: Secondary | ICD-10-CM | POA: Insufficient documentation

## 2013-05-19 DIAGNOSIS — R110 Nausea: Secondary | ICD-10-CM | POA: Insufficient documentation

## 2013-05-19 NOTE — Progress Notes (Signed)
Please see the Nurse Progress Note in the MD Initial Consult Encounter for this patient. 

## 2013-05-19 NOTE — Progress Notes (Signed)
  Radiation Oncology         (336) [phone] ________________________________  Name: Ebony Herring MRN: [id number]  Date: 05/19/2013  DOB: 08-05-1931  Reevaluation note  CC: [name], MD  Gordy Levan, MD  Diagnosis:   Endometrial cancer   Primary site: Corpus Uteri - Carcinoma   Staging method: AJCC 7th Edition   Clinical: Stage IIIC2 (T3a, N2, M0)   Pathologic: Stage IIIC2 (T3a, N2, M0)   Summary: Stage IIIC2 (T3a, N2, M0)     Narrative:  The patient returns today for further evaluation. She was recently seen in consultation 03/16/2013. Since that time the patient has proceeded to undergo her adjuvant chemotherapy consisting of Taxol and carboplatinum. She recently completed her third cycle of chemotherapy. Overall the patient tolerated her therapy well except for fatigue.. She has had some joint and muscle aches recently with her Neupogen. She denies any pelvic pain vaginal bleeding urination difficulties or bowel complaints.                           ALLERGIES:  is allergic to bactrim and ciprofloxacin.  Meds: Current Outpatient Prescriptions  Medication Sig Dispense Refill  . Brinzolamide-Brimonidine (SIMBRINZA) 1-0.2 % SUSP Place 1 drop into both eyes 2 (two) times daily.       Marland Kitchen dexamethasone (DECADRON) 4 MG tablet Take 5 tabs = 20 mg with food 12 hours and 6 hours prior to Taxol chemotherapy.  10 tablet  0  . LORazepam (ATIVAN) 0.5 MG tablet Take 1 tablet under the tongue or swallow every 6 hours as needed for nausea  20 tablet  0  . ondansetron (ZOFRAN) 8 MG tablet Take 1-2 tablets (8-16 mg total) by mouth every 12 (twelve) hours as needed for nausea or vomiting.  20 tablet  1   No current facility-administered medications for this encounter.    Physical Findings: The patient is in no acute distress. Patient is alert and oriented.  height is 5' 3.5" (1.613 m) and weight is 128 lb 3.2 oz (58.151 kg). Her temperature is 99 F (37.2 C). Her blood pressure is 133/68  and her pulse is 79. Marland Kitchen  No palpable supraclavicular or axillary adenopathy. The lungs are clear to auscultation. The heart has a regular rhythm and rate. The abdomen is soft and nontender with normal bowel sounds. The inguinal areas are free of adenopathy. On pelvic examination the external genitalia are unremarkable. A speculum exam is performed. It was difficult to get a good view of the vaginal cuff but with bimanual and rectovaginal examination there was no evidence of pelvic mass or recurrence.  Lab Findings: Lab Results  Component Value Date   WBC 7.5 05/09/2013   HGB 10.2* 05/09/2013   HCT 31.9* 05/09/2013   MCV 91.9 05/09/2013   PLT 292 05/09/2013      Radiographic Findings: No results found.  Impression:  Patient is ready to proceed with planning for her pelvis and periaortic radiation therapy.  I would also recommend intracavitary brachytherapy treatments given the serous designation with her pathology and findings of angiolymphatic invasion with a deeply invasive tumor within the myometrium.  Plan:  Simulation and planning scheduled for April 13. She will proceed with radiation therapy approximately 3 weeks post chemotherapy.  ____________________________________ Blair Promise, MD

## 2013-05-20 ENCOUNTER — Ambulatory Visit: Payer: Medicare Other | Admitting: Gynecology

## 2013-05-22 ENCOUNTER — Other Ambulatory Visit: Payer: Self-pay | Admitting: Oncology

## 2013-05-23 ENCOUNTER — Ambulatory Visit
Admission: RE | Admit: 2013-05-23 | Discharge: 2013-05-23 | Disposition: A | Discharge status: Home / Self Care | Payer: Medicare Other | Source: Ambulatory Visit | Attending: Radiation Oncology | Admitting: Radiation Oncology

## 2013-05-23 ENCOUNTER — Other Ambulatory Visit (HOSPITAL_BASED_OUTPATIENT_CLINIC_OR_DEPARTMENT_OTHER): Payer: Medicare Other

## 2013-05-23 ENCOUNTER — Ambulatory Visit (HOSPITAL_BASED_OUTPATIENT_CLINIC_OR_DEPARTMENT_OTHER): Payer: Medicare Other | Admitting: Oncology

## 2013-05-23 ENCOUNTER — Encounter: Payer: Self-pay | Admitting: Oncology

## 2013-05-23 ENCOUNTER — Other Ambulatory Visit: Payer: Self-pay | Admitting: *Deleted

## 2013-05-23 VITALS — BP 142/72 | HR 84 | Temp 98.0°F | Resp 18 | Ht 63.0 in | Wt 128.8 lb

## 2013-05-23 DIAGNOSIS — C549 Malignant neoplasm of corpus uteri, unspecified: Secondary | ICD-10-CM

## 2013-05-23 DIAGNOSIS — D6481 Anemia due to antineoplastic chemotherapy: Secondary | ICD-10-CM

## 2013-05-23 DIAGNOSIS — C541 Malignant neoplasm of endometrium: Secondary | ICD-10-CM

## 2013-05-23 DIAGNOSIS — T451X5A Adverse effect of antineoplastic and immunosuppressive drugs, initial encounter: Secondary | ICD-10-CM

## 2013-05-23 DIAGNOSIS — Z510 Encounter for antineoplastic radiation therapy: Secondary | ICD-10-CM | POA: Diagnosis not present

## 2013-05-23 LAB — COMPREHENSIVE METABOLIC PANEL (CC13)
ALT: 8 U/L (ref 0–55)
ANION GAP: 9 meq/L (ref 3–11)
AST: 17 U/L (ref 5–34)
Albumin: 3.5 g/dL (ref 3.5–5.0)
Alkaline Phosphatase: 72 U/L (ref 40–150)
BUN: 12.6 mg/dL (ref 7.0–26.0)
CHLORIDE: 110 meq/L — AB (ref 98–109)
CO2: 25 mEq/L (ref 22–29)
CREATININE: 0.9 mg/dL (ref 0.6–1.1)
Calcium: 8.7 mg/dL (ref 8.4–10.4)
GLUCOSE: 93 mg/dL (ref 70–140)
Potassium: 4.1 mEq/L (ref 3.5–5.1)
Sodium: 144 mEq/L (ref 136–145)
Total Bilirubin: <0.2 mg/dL (ref 0.20–1.20)
Total Protein: 6.5 g/dL (ref 6.4–8.3)

## 2013-05-23 LAB — CBC WITH DIFFERENTIAL/PLATELET
BASO%: 0.9 % (ref 0.0–2.0)
Basophils Absolute: 0 10*3/uL (ref 0.0–0.1)
EOS ABS: 0 10*3/uL (ref 0.0–0.5)
EOS%: 0.6 % (ref 0.0–7.0)
HCT: 26.2 % — ABNORMAL LOW (ref 34.8–46.6)
HEMOGLOBIN: 8.8 g/dL — AB (ref 11.6–15.9)
LYMPH%: 43.7 % (ref 14.0–49.7)
MCH: 31.5 pg (ref 25.1–34.0)
MCHC: 33.4 g/dL (ref 31.5–36.0)
MCV: 94.6 fL (ref 79.5–101.0)
MONO#: 0.6 10*3/uL (ref 0.1–0.9)
MONO%: 11.3 % (ref 0.0–14.0)
NEUT#: 2.2 10*3/uL (ref 1.5–6.5)
NEUT%: 43.5 % (ref 38.4–76.8)
Platelets: 199 10*3/uL (ref 145–400)
RBC: 2.77 10*6/uL — ABNORMAL LOW (ref 3.70–5.45)
RDW: 18.2 % — ABNORMAL HIGH (ref 11.2–14.5)
WBC: 5.1 10*3/uL (ref 3.9–10.3)
lymph#: 2.2 10*3/uL (ref 0.9–3.3)

## 2013-05-23 MED ORDER — SODIUM CHLORIDE 0.9 % IJ SOLN
10.0000 mL | Freq: Once | INTRAMUSCULAR | Status: AC
  Administered 2013-05-23: 10 mL via INTRAVENOUS

## 2013-05-23 MED ORDER — FERROUS FUMARATE 325 (106 FE) MG PO TABS: ORAL_TABLET | ORAL | Status: DC

## 2013-05-23 NOTE — Progress Notes (Signed)
Attempted IV stick x 1.   Joaquim Lai, RN started a 22 gauge IV in [place]'s right AC.  Blood returned noted.  Notified CT SIM that patient is ready.

## 2013-05-23 NOTE — Progress Notes (Signed)
Ebony Herring here for IV start for CT SIM.

## 2013-05-23 NOTE — Progress Notes (Signed)
IV removed intact by Nicholos Johns, RN.

## 2013-05-23 NOTE — Progress Notes (Signed)
  Radiation Oncology         (336) [phone] ________________________________  Name: Ebony Herring MRN: [id number]  Date: 05/23/2013  DOB: December 28, 1931  SIMULATION AND TREATMENT PLANNING NOTE   Diagnosis: Endometrial cancer  Primary site: Corpus Uteri - Carcinoma  Staging method: AJCC 7th Edition  Clinical: Stage IIIC2 (T3a, N2, M0)  Pathologic: Stage IIIC2 (T3a, N2, M0)  Summary: Stage IIIC2 (T3a, N2, M0  NARRATIVE:  The patient was brought to the [place].  Identity was confirmed.  All relevant records and images related to the planned course of therapy were reviewed.  The patient freely provided informed written consent to proceed with treatment after reviewing the details related to the planned course of therapy. The consent form was witnessed and verified by the simulation staff.  Then, the patient was set-up in a stable reproducible  supine position for radiation therapy.  CT images were obtained.  Surface markings were placed.  The CT images were loaded into the planning software.  Then the target and avoidance structures were contoured.  Treatment planning then occurred.  The radiation prescription was entered and confirmed.  Then, I designed and supervised the construction of a total of 1 medically necessary complex treatment devices.  I have requested : Intensity Modulated Radiotherapy (IMRT) is medically necessary for this case for the following reason:  Small bowel sparing..  I have ordered:dose calc.  PLAN:  The patient will receive 45 Gy in 25 fractions followed by 3 high-dose rate intracavitary brachytherapy treatments.  ________________________________  -----------------------------------  Blair Promise, PhD, MD

## 2013-05-23 NOTE — Progress Notes (Signed)
OFFICE PROGRESS NOTE   05/23/2013   Physicians:Daniel ClarkePearson, [name] F (PCP [facility]), Gery Pray, Everlene Farrier, Donato Heinz (ophth)   INTERVAL HISTORY:  Patient is seen, together with husband, in follow up of cycle #3 taxol carboplatin given 05-09-13, with neupogen x 3 days beginning 05-10-13. She is for simulation in radiation oncology also today.  Patient reports severe generalized aches after taxol/ neupogen, now resolved. Peripheral IV access was very difficult and she is in agreement with [facility] for additional chemo planned after RT. She has not had any significant nausea and denies peripheral neuropathy symptoms. She denies significant fatigue or DOE. She needs appointment with Dr Josephina Shih rescheduled, gyn onc aware. Follow up urine culture 05-05-13 had multiple species. She has no symptoms of UTI now.   ONCOLOGIC HISTORY Patient presented to PCP with vaginal discharge with some bleeding since Nov 2014. 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 MRI pelvis 01-20-13 Had uterine mass and prominent bilateral iliac nodes. She had TAH BSO and pelvic and para-aortic lymphadenectomy by Dr Josephina Shih on 03-01-2013. Pathology [phone]) had high grade serous carcinoma of endometrium extending to outer half of myometrium, with extensive angiolymphatic invasion, involvement of right ovary, involvement of 2/9 pelvic nodes and 3/4 para-aortic nodes. At completion of surgery there was no gross residual disease. She was transfused 2 units of PRBCs on 03-05-13. She had small separation of upper portion of incision and erythema at lower portion of incision initially, both resolved prior to start of chemo. She had E.coli UTI 03-07-13.  Review of systems as above, also: No SOB or cough No LE swelling. No fever or symptoms of infection Remainder of 10 point Review of Systems  negative.  Objective:  Vital signs in last 24 hours:  BP 142/72  Pulse 84  Temp(Src) 98 F (36.7 C) (Oral)  Resp 18  Ht 5\' 3"  (1.6 m)  Wt 128 lb 12.8 oz (58.423 kg)  BMI 22.82 kg/m2  Alert, oriented and appropriate. Ambulatory without difficulty.  Alopecia  HEENT:PERRL, sclerae not icteric. Oral mucosa moist without lesions, posterior pharynx clear.  Neck supple. No JVD.  Lymphatics:no cervical,suraclavicular adenopathy Resp: clear to auscultation bilaterally and normal percussion bilaterally Cardio: regular rate and rhythm. No gallop. GI: soft, nontender, not distended, no mass or organomegaly. Normally active bowel sounds.  Musculoskeletal/ Extremities: without pitting edema, cords, tenderness Neuro: no peripheral neuropathy. Otherwise nonfocal Skin without rash, ecchymosis, petechiae   Lab Results:  Results for orders placed in visit on 05/23/13  CBC WITH DIFFERENTIAL      Result Value Ref Range   WBC 5.1  3.9 - 10.3 10e3/uL   NEUT# 2.2  1.5 - 6.5 10e3/uL   HGB 8.8 (*) 11.6 - 15.9 g/dL   HCT 26.2 (*) 34.8 - 46.6 %   Platelets 199  145 - 400 10e3/uL   MCV 94.6  79.5 - 101.0 fL   MCH 31.5  25.1 - 34.0 pg   MCHC 33.4  31.5 - 36.0 g/dL   RBC 2.77 (*) 3.70 - 5.45 10e6/uL   RDW 18.2 (*) 11.2 - 14.5 %   lymph# 2.2  0.9 - 3.3 10e3/uL   MONO# 0.6  0.1 - 0.9 10e3/uL   Eosinophils Absolute 0.0  0.0 - 0.5 10e3/uL   Basophils Absolute 0.0  0.0 - 0.1 10e3/uL   NEUT% 43.5  38.4 - 76.8 %   LYMPH% 43.7  14.0 - 49.7 %  MONO% 11.3  0.0 - 14.0 %   EOS% 0.6  0.0 - 7.0 %   BASO% 0.9  0.0 - 2.0 %  COMPREHENSIVE METABOLIC PANEL (TW65)      Result Value Ref Range   Sodium 144  136 - 145 mEq/L   Potassium 4.1  3.5 - 5.1 mEq/L   Chloride 110 (*) 98 - 109 mEq/L   CO2 25  22 - 29 mEq/L   Glucose 93  70 - 140 mg/dl   BUN 12.6  7.0 - 26.0 mg/dL   Creatinine 0.9  0.6 - 1.1 mg/dL   Total Bilirubin <0.20  0.20 - 1.20 mg/dL   Alkaline Phosphatase 72  40 - 150 U/L   AST 17  5 - 34 U/L    ALT 8  0 - 55 U/L   Total Protein 6.5  6.4 - 8.3 g/dL   Albumin 3.5  3.5 - 5.0 g/dL   Calcium 8.7  8.4 - 10.4 mg/dL   Anion Gap 9  3 - 11 mEq/L     Studies/Results:  No results found.  Medications: I have reviewed the patient's current medications.  DISCUSSION: addressed anemia and IV access difficulty. She does not feel she is particularly symptomatic from the anemia, so will follow for now, hoping this will improve in break off of chemo, tho may drop further with pelvic RT. She will need [name] placed prior to 3 additional cycles of chemo after RT completes.  Assessment/Plan:  1.IIIC2 high grade serous endometrial carcinoma: surgery 03-01-13, 3 cycles of taxol carboplatin 03-28-13 thru 05-09-13 with gCSF required, now for RT by Dr Sondra Come, then additional 3 cycles chemo.  2.Difficult peripheral IV access: very high risk disease, so reasonable to place [name] even tho only 3 cycles remain at this point in care. 3.several UTIs around surgery, seem to be cleared now. 4.anemia: related to chemo, as above 5.glaucoma 6.no mammograms in years, no colonoscoppy   Patient aware that schedulers will be in touch with her about appointments. RT schedule is not yet available, and we may need to adjust time of next appointment to coordinate.   Gordy Levan, MD   05/23/2013, 2:37 PM

## 2013-05-25 ENCOUNTER — Telehealth: Payer: Self-pay | Admitting: Oncology

## 2013-05-25 NOTE — Telephone Encounter (Signed)
called pt re lb/LL 5/27 but was not able to reach her or lm. schedule mailed.

## 2013-05-26 DIAGNOSIS — Z510 Encounter for antineoplastic radiation therapy: Secondary | ICD-10-CM | POA: Diagnosis not present

## 2013-05-30 DIAGNOSIS — Z510 Encounter for antineoplastic radiation therapy: Secondary | ICD-10-CM | POA: Diagnosis not present

## 2013-05-31 ENCOUNTER — Telehealth: Payer: Self-pay | Admitting: *Deleted

## 2013-05-31 NOTE — Telephone Encounter (Signed)
Called pt to notify her of her future appointment with Dr. Fermin Schwab in [place] on May 20th. At 11:45. Pt agreed with time and date.

## 2013-06-01 ENCOUNTER — Ambulatory Visit
Admission: RE | Admit: 2013-06-01 | Discharge: 2013-06-01 | Disposition: A | Discharge status: Home / Self Care | Payer: Medicare Other | Source: Ambulatory Visit | Attending: Radiation Oncology | Admitting: Radiation Oncology

## 2013-06-01 DIAGNOSIS — Z510 Encounter for antineoplastic radiation therapy: Secondary | ICD-10-CM | POA: Diagnosis not present

## 2013-06-01 DIAGNOSIS — C541 Malignant neoplasm of endometrium: Secondary | ICD-10-CM

## 2013-06-01 NOTE — Progress Notes (Signed)
   Department of Radiation Oncology  Phone:  [phone] Fax:        [phone]  IMR T. device note  Today the patient had construction of her IMR T. device helical intensity modulated radiation therapy. Patient will be treated with 14.2 sinogram segments. This constitutes 1 IM RT device.  -----------------------------------  Blair Promise, PhD, MD

## 2013-06-02 ENCOUNTER — Ambulatory Visit
Admission: RE | Admit: 2013-06-02 | Discharge: 2013-06-02 | Disposition: A | Discharge status: Home / Self Care | Payer: Medicare Other | Source: Ambulatory Visit | Attending: Radiation Oncology | Admitting: Radiation Oncology

## 2013-06-02 DIAGNOSIS — Z510 Encounter for antineoplastic radiation therapy: Secondary | ICD-10-CM | POA: Diagnosis not present

## 2013-06-03 ENCOUNTER — Ambulatory Visit
Admission: RE | Admit: 2013-06-03 | Discharge: 2013-06-03 | Disposition: A | Discharge status: Home / Self Care | Payer: Medicare Other | Source: Ambulatory Visit | Attending: Radiation Oncology | Admitting: Radiation Oncology

## 2013-06-03 DIAGNOSIS — Z510 Encounter for antineoplastic radiation therapy: Secondary | ICD-10-CM | POA: Diagnosis not present

## 2013-06-03 DIAGNOSIS — C541 Malignant neoplasm of endometrium: Secondary | ICD-10-CM

## 2013-06-03 NOTE — Progress Notes (Signed)
Gave Brooklynn the Radiation Therapy and You book and discussed potential side effects/maanagement of fatigue, diarrhea, nausea, skin changes and bladder changes.  Elda said that she has had episodes of nausea two hours after treatment.  Advised her that she can take zofran for nausea 1 hour before treatment and that may help.  Also gave her a sitz bath and instructed her on how to use it.  She was given my business card and was instructed to call with any questions or concerns.

## 2013-06-06 ENCOUNTER — Ambulatory Visit
Admission: RE | Admit: 2013-06-06 | Discharge: 2013-06-06 | Disposition: A | Discharge status: Home / Self Care | Payer: Medicare Other | Source: Ambulatory Visit | Attending: Radiation Oncology | Admitting: Radiation Oncology

## 2013-06-06 DIAGNOSIS — Z510 Encounter for antineoplastic radiation therapy: Secondary | ICD-10-CM | POA: Diagnosis not present

## 2013-06-07 ENCOUNTER — Ambulatory Visit
Admission: RE | Admit: 2013-06-07 | Discharge: 2013-06-07 | Disposition: A | Discharge status: Home / Self Care | Payer: Medicare Other | Source: Ambulatory Visit | Attending: Radiation Oncology | Admitting: Radiation Oncology

## 2013-06-07 VITALS — BP 119/61 | HR 79 | Temp 98.8°F | Ht 63.0 in | Wt 127.3 lb

## 2013-06-07 DIAGNOSIS — Z510 Encounter for antineoplastic radiation therapy: Secondary | ICD-10-CM | POA: Diagnosis not present

## 2013-06-07 DIAGNOSIS — C541 Malignant neoplasm of endometrium: Secondary | ICD-10-CM

## 2013-06-07 NOTE — Progress Notes (Signed)
  Radiation Oncology         [phone] ________________________________  Name: Ebony Herring MRN: [id number]  Date: 06/07/2013  DOB: 07/29/1931  Weekly Radiation Therapy Management  Current Dose: 9 Gy     Planned Dose:  45 + Gy  Narrative . . . . . . . . The patient presents for routine under treatment assessment.                                   The patient is without complaint her nausea occurring one to 2 hours after her radiation treatments. I recommended she use her Zofran 1 hour prior to her radiation treatment to help alleviate this issue.                                 Set-up films were reviewed.                                 The chart was checked. Physical Findings. . .  height is 5\' 3"  (1.6 m) and weight is 127 lb 4.8 oz (57.743 kg). Her temperature is 98.8 F (37.1 C). Her blood pressure is 119/61 and her pulse is 79. . Weight essentially stable.  No significant changes. Impression . . . . . . . The patient is tolerating radiation. Plan . . . . . . . . . . . . Continue treatment as planned.  ________________________________   Blair Promise, PhD, MD

## 2013-06-07 NOTE — Progress Notes (Signed)
Ebony Herring has had 5 fractions to her pelvis.  She denies pain.  She has had nausea about 2 hours after treatment with dry heaves that last about 15 minutes.  Advised her to take zofran that she has left from chemo about an hour before treatment.  She denies any bladder issues, diarrhea and vaginal/rectal bleeding.  She reports having constipation and dark stools from her iron pills.  She denies fatigue.

## 2013-06-08 ENCOUNTER — Ambulatory Visit
Admission: RE | Admit: 2013-06-08 | Discharge: 2013-06-08 | Disposition: A | Discharge status: Home / Self Care | Payer: Medicare Other | Source: Ambulatory Visit | Attending: Radiation Oncology | Admitting: Radiation Oncology

## 2013-06-08 DIAGNOSIS — Z510 Encounter for antineoplastic radiation therapy: Secondary | ICD-10-CM | POA: Diagnosis not present

## 2013-06-09 ENCOUNTER — Ambulatory Visit
Admission: RE | Admit: 2013-06-09 | Discharge: 2013-06-09 | Disposition: A | Discharge status: Home / Self Care | Payer: Medicare Other | Source: Ambulatory Visit | Attending: Radiation Oncology | Admitting: Radiation Oncology

## 2013-06-09 DIAGNOSIS — Z510 Encounter for antineoplastic radiation therapy: Secondary | ICD-10-CM | POA: Diagnosis not present

## 2013-06-10 ENCOUNTER — Ambulatory Visit
Admission: RE | Admit: 2013-06-10 | Discharge: 2013-06-10 | Disposition: A | Discharge status: Home / Self Care | Payer: Medicare Other | Source: Ambulatory Visit | Attending: Radiation Oncology | Admitting: Radiation Oncology

## 2013-06-10 DIAGNOSIS — Z510 Encounter for antineoplastic radiation therapy: Secondary | ICD-10-CM | POA: Diagnosis not present

## 2013-06-13 ENCOUNTER — Ambulatory Visit
Admission: RE | Admit: 2013-06-13 | Discharge: 2013-06-13 | Disposition: A | Discharge status: Home / Self Care | Payer: Medicare Other | Source: Ambulatory Visit | Attending: Radiation Oncology | Admitting: Radiation Oncology

## 2013-06-13 DIAGNOSIS — Z510 Encounter for antineoplastic radiation therapy: Secondary | ICD-10-CM | POA: Diagnosis not present

## 2013-06-14 ENCOUNTER — Ambulatory Visit
Admission: RE | Admit: 2013-06-14 | Discharge: 2013-06-14 | Disposition: A | Discharge status: Home / Self Care | Payer: Medicare Other | Source: Ambulatory Visit | Attending: Radiation Oncology | Admitting: Radiation Oncology

## 2013-06-14 VITALS — BP 133/79 | HR 79 | Temp 98.3°F | Ht 63.0 in | Wt 127.0 lb

## 2013-06-14 DIAGNOSIS — C541 Malignant neoplasm of endometrium: Secondary | ICD-10-CM

## 2013-06-14 DIAGNOSIS — Z510 Encounter for antineoplastic radiation therapy: Secondary | ICD-10-CM | POA: Diagnosis not present

## 2013-06-14 NOTE — Progress Notes (Signed)
  Radiation Oncology         [phone] ________________________________  Name: Ebony Herring MRN: [id number]  Date: 06/14/2013  DOB: [birth year]  Weekly Radiation Therapy Management  Endometrial cancer   Primary site: Corpus Uteri - Carcinoma   Staging method: AJCC 7th Edition   Clinical: Stage IIIC2 (T3a, N2, M0)   Pathologic: Stage IIIC2 (T3a, N2, M0)   Summary: Stage IIIC2 (T3a, N2, M0)   Current Dose: 18 Gy     Planned Dose:  45 + Gy  Narrative . . . . . . . . The patient presents for routine under treatment assessment.                                   The patient is without complaint except for more fatigue                                 Set-up films were reviewed.                                 The chart was checked. Physical Findings. . .  height is 5\' 3"  (1.6 m) and weight is 127 lb (57.607 kg). Her temperature is 98.3 F (36.8 C). Her blood pressure is 133/79 and her pulse is 79. . Weight essentially stable.  The lungs are clear. The heart has regular rhythm and rate. The abdomen is soft and nontender with normal bowel sounds. Impression . . . . . . . The patient is tolerating radiation. Plan . . . . . . . . . . . . Continue treatment as planned.  ________________________________   Blair Promise, PhD, MD

## 2013-06-14 NOTE — Progress Notes (Signed)
Ebony Herring has had 10 fractions to her pelvis.  She denies pain, bladder issues, nausea, rectal/vaginal bleeding and skin irritation.  She has noticed that she is more tired than usual.  She reports having once loose bowel movement per day.

## 2013-06-15 ENCOUNTER — Ambulatory Visit
Admission: RE | Admit: 2013-06-15 | Discharge: 2013-06-15 | Disposition: A | Discharge status: Home / Self Care | Payer: Medicare Other | Source: Ambulatory Visit | Attending: Radiation Oncology | Admitting: Radiation Oncology

## 2013-06-15 DIAGNOSIS — Z510 Encounter for antineoplastic radiation therapy: Secondary | ICD-10-CM | POA: Diagnosis not present

## 2013-06-16 ENCOUNTER — Ambulatory Visit
Admission: RE | Admit: 2013-06-16 | Discharge: 2013-06-16 | Disposition: A | Discharge status: Home / Self Care | Payer: Medicare Other | Source: Ambulatory Visit | Attending: Radiation Oncology | Admitting: Radiation Oncology

## 2013-06-16 DIAGNOSIS — Z510 Encounter for antineoplastic radiation therapy: Secondary | ICD-10-CM | POA: Diagnosis not present

## 2013-06-17 ENCOUNTER — Ambulatory Visit
Admission: RE | Admit: 2013-06-17 | Discharge: 2013-06-17 | Disposition: A | Discharge status: Home / Self Care | Payer: Medicare Other | Source: Ambulatory Visit | Attending: Radiation Oncology | Admitting: Radiation Oncology

## 2013-06-17 DIAGNOSIS — Z510 Encounter for antineoplastic radiation therapy: Secondary | ICD-10-CM | POA: Diagnosis not present

## 2013-06-20 ENCOUNTER — Ambulatory Visit
Admission: RE | Admit: 2013-06-20 | Discharge: 2013-06-20 | Disposition: A | Discharge status: Home / Self Care | Payer: Medicare Other | Source: Ambulatory Visit | Attending: Radiation Oncology | Admitting: Radiation Oncology

## 2013-06-20 DIAGNOSIS — Z510 Encounter for antineoplastic radiation therapy: Secondary | ICD-10-CM | POA: Diagnosis not present

## 2013-06-21 ENCOUNTER — Ambulatory Visit
Admission: RE | Admit: 2013-06-21 | Discharge: 2013-06-21 | Disposition: A | Discharge status: Home / Self Care | Payer: Medicare Other | Source: Ambulatory Visit | Attending: Radiation Oncology | Admitting: Radiation Oncology

## 2013-06-21 VITALS — BP 101/68 | HR 78 | Temp 98.7°F | Ht 63.0 in | Wt 124.6 lb

## 2013-06-21 DIAGNOSIS — C541 Malignant neoplasm of endometrium: Secondary | ICD-10-CM

## 2013-06-21 DIAGNOSIS — Z510 Encounter for antineoplastic radiation therapy: Secondary | ICD-10-CM | POA: Diagnosis not present

## 2013-06-21 NOTE — Progress Notes (Signed)
  Radiation Oncology         [phone] ________________________________  Name: Ebony Herring MRN: [id number]  Date: 06/21/2013  DOB: [birth year]  Weekly Radiation Therapy Management  Current Dose: 27 Gy     Planned Dose:  45 + Gy  Narrative . . . . . . . . The patient presents for routine under treatment assessment.                                   The patient is without complaint.  She's had some mild problems with diarrhea. She is also noticed some fatigue.                                 Set-up films were reviewed.                                 The chart was checked. Physical Findings. . .  height is 5\' 3"  (1.6 m) and weight is 124 lb 9.6 oz (56.518 kg). Her temperature is 98.7 F (37.1 C). Her blood pressure is 101/68 and her pulse is 78. . The lungs are clear. The heart has a regular rhythm and rate. Impression . . . . . . . The patient is tolerating radiation. Plan . . . . . . . . . . . . Continue treatment as planned.  ________________________________   Blair Promise, PhD, MD

## 2013-06-21 NOTE — Progress Notes (Signed)
Ebony Herring has had 15 fractions to her pelvis.  Ebony Herring denies pain, bladder changes, vaginal bleeding and skin irritation.  Ebony Herring reports occasional diarrhea.  Ebony Herring reports feeling more tired.  Ebony Herring has lost 3 lbs since last week.  Ebony Herring reports that Ebony Herring has been eating the same amount and her appetite is ok.  Ebony Herring does not want to see the dietician.

## 2013-06-22 ENCOUNTER — Ambulatory Visit
Admission: RE | Admit: 2013-06-22 | Discharge: 2013-06-22 | Disposition: A | Discharge status: Home / Self Care | Payer: Medicare Other | Source: Ambulatory Visit | Attending: Radiation Oncology | Admitting: Radiation Oncology

## 2013-06-22 DIAGNOSIS — Z510 Encounter for antineoplastic radiation therapy: Secondary | ICD-10-CM | POA: Diagnosis not present

## 2013-06-23 ENCOUNTER — Ambulatory Visit
Admission: RE | Admit: 2013-06-23 | Discharge: 2013-06-23 | Disposition: A | Discharge status: Home / Self Care | Payer: Medicare Other | Source: Ambulatory Visit | Attending: Radiation Oncology | Admitting: Radiation Oncology

## 2013-06-23 DIAGNOSIS — Z510 Encounter for antineoplastic radiation therapy: Secondary | ICD-10-CM | POA: Diagnosis not present

## 2013-06-24 ENCOUNTER — Ambulatory Visit: Admission: RE | Admit: 2013-06-24 | Payer: Medicare Other | Source: Ambulatory Visit

## 2013-06-27 ENCOUNTER — Ambulatory Visit
Admission: RE | Admit: 2013-06-27 | Discharge: 2013-06-27 | Disposition: A | Discharge status: Home / Self Care | Payer: Medicare Other | Source: Ambulatory Visit | Attending: Radiation Oncology | Admitting: Radiation Oncology

## 2013-06-27 DIAGNOSIS — Z510 Encounter for antineoplastic radiation therapy: Secondary | ICD-10-CM | POA: Diagnosis not present

## 2013-06-28 ENCOUNTER — Ambulatory Visit
Admission: RE | Admit: 2013-06-28 | Discharge: 2013-06-28 | Disposition: A | Discharge status: Home / Self Care | Payer: Medicare Other | Source: Ambulatory Visit | Attending: Radiation Oncology | Admitting: Radiation Oncology

## 2013-06-28 ENCOUNTER — Encounter: Payer: Self-pay | Admitting: Radiation Oncology

## 2013-06-28 VITALS — BP 105/70 | HR 75 | Resp 16 | Wt 122.5 lb

## 2013-06-28 DIAGNOSIS — Z510 Encounter for antineoplastic radiation therapy: Secondary | ICD-10-CM | POA: Diagnosis not present

## 2013-06-28 DIAGNOSIS — C541 Malignant neoplasm of endometrium: Secondary | ICD-10-CM

## 2013-06-28 NOTE — Progress Notes (Signed)
  Radiation Oncology         [phone] ________________________________  Name: Ebony Herring MRN: [id number]  Date: 06/28/2013  DOB: 03-24-1931  Weekly Radiation Therapy Management  Endometrial cancer   Primary site: Corpus Uteri - Carcinoma   Staging method: AJCC 7th Edition   Clinical: Stage IIIC2 (T3a, N2, M0)   Pathologic: Stage IIIC2 (T3a, N2, M0)   Summary: Stage IIIC2 (T3a, N2, M0)  Current Dose: 34.2 Gy     Planned Dose:  45+ Gy  Narrative . . . . . . . . The patient presents for routine under treatment assessment.                                   The patient is without complaint. She has mild diarrhea but is not requiring any Imodium for this issue                                 Set-up films were reviewed.                                 The chart was checked. Physical Findings. . .  weight is 122 lb 8 oz (55.566 kg). Her blood pressure is 105/70 and her pulse is 75. Her respiration is 16. . Weight essentially stable.  No significant changes. Impression . . . . . . . The patient is tolerating radiation. Plan . . . . . . . . . . . . Continue treatment as planned.   ________________________________   Blair Promise, PhD, MD

## 2013-06-28 NOTE — Progress Notes (Signed)
Weight stable. Denies pain. Reports she missed treatment last Friday because the machine was down. Reports fatigue is no worse than it was prior to treatment. Denies dysuria or hematuria. Reports mild episodes of diarrhea each morning and night. Denies nausea or vomiting. Denies vaginal itching or odor. Reports that she has three chemo treatments left. Vitals stable.

## 2013-06-29 ENCOUNTER — Ambulatory Visit: Payer: Medicare Other | Attending: Gynecology | Admitting: Gynecology

## 2013-06-29 ENCOUNTER — Encounter: Payer: Self-pay | Admitting: Gynecology

## 2013-06-29 ENCOUNTER — Ambulatory Visit
Admission: RE | Admit: 2013-06-29 | Discharge: 2013-06-29 | Disposition: A | Discharge status: Home / Self Care | Payer: Medicare Other | Source: Ambulatory Visit | Attending: Radiation Oncology | Admitting: Radiation Oncology

## 2013-06-29 VITALS — BP 128/67 | HR 72 | Temp 98.3°F | Resp 18 | Ht 63.0 in | Wt 120.4 lb

## 2013-06-29 DIAGNOSIS — Z9221 Personal history of antineoplastic chemotherapy: Secondary | ICD-10-CM | POA: Insufficient documentation

## 2013-06-29 DIAGNOSIS — Z79899 Other long term (current) drug therapy: Secondary | ICD-10-CM | POA: Insufficient documentation

## 2013-06-29 DIAGNOSIS — H409 Unspecified glaucoma: Secondary | ICD-10-CM | POA: Insufficient documentation

## 2013-06-29 DIAGNOSIS — C541 Malignant neoplasm of endometrium: Secondary | ICD-10-CM

## 2013-06-29 DIAGNOSIS — Z90710 Acquired absence of both cervix and uterus: Secondary | ICD-10-CM | POA: Insufficient documentation

## 2013-06-29 DIAGNOSIS — C549 Malignant neoplasm of corpus uteri, unspecified: Secondary | ICD-10-CM | POA: Insufficient documentation

## 2013-06-29 DIAGNOSIS — Z510 Encounter for antineoplastic radiation therapy: Secondary | ICD-10-CM | POA: Diagnosis not present

## 2013-06-29 DIAGNOSIS — Z923 Personal history of irradiation: Secondary | ICD-10-CM | POA: Insufficient documentation

## 2013-06-29 NOTE — Patient Instructions (Signed)
Continue radiation therapy follow up with Dr. Fermin Schwab in approximately 2-3 months after treatment is complete.

## 2013-06-29 NOTE — Progress Notes (Signed)
Consult Note: Gyn-Onc   Ebony Herring 80 y.o. female  Chief Complaint  Patient presents with  . Endo ca    Follow up    Assessment : Stage III C. poorly differentiated endometrial carcinoma. Ongoing adjuvant therapy  Plan:  As previously scheduled patient completed radiation therapy and then resume chemotherapy for 3 additional cycles of carboplatin and Taxol.  I will see the patient back in approximately 2 months. She is advised to obtain Imodium to use as needed for diarrhea.  Interval history: The patient is now completed 3 cycles of carboplatin and Taxol and is approximately halfway through her planned course of extended field radiation therapy. She sees be tolerated radiation therapy well with no significant side effects except for some fatigue. She has a good appetite is no other GI or GU symptoms. She denies any pelvic pain pressure vaginal bleeding or discharge. HPI: 80 year old white married female seen in consultation at the request of Dr. Gaetano Net regarding the management of a newly diagnosed high-grade endometrial carcinoma. The patient's history dates back to approximately November when she developed of vaginal discharge. Overtime some blood was noted in the discharge. Ultimately the patient was referred to Dr. Gertie Fey for further evaluation. An endometrial biopsy was obtained on 01/26/2013 showed a high-grade endometrial carcinoma with serous features. Subsequently the patient's had an MRI of the pelvis showing a fibroid uterus and prominent bilateral iliac lymph nodes measuring between 1 cm and 7 mm. Currently the patient is having minimal bleeding and no significant pain.  Past gynecologic history includes a bilateral tubal ligation. Obstetrical history gravida 2.  Patient underwent exploratory laparotomy total abdominal hysterectomy bilateral salpingo-oophorectomy pelvic and para-aortic lymphadenectomy on 03/01/2013. Final pathology showed a invasive high-grade serous carcinoma  with extensive lymphatic invasion, metastasis to the right ovary and tube of 9 pelvic lymph nodes and 3 of 4 para-aortic lymph nodes. "Sandwich" therapy using carboplatin and Taxol and extended field radiation therapy was recommended.    Review of Systems:10 point review of systems is negative except as noted in interval history.   Vitals: Blood pressure 128/67, pulse 72, temperature 98.3 F (36.8 C), temperature source Oral, resp. rate 18, height 5\' 3"  (1.6 m), weight 120 lb 6.4 oz (54.613 kg).  Physical Exam: General : The patient is a healthy woman in no acute distress.  HEENT: normocephalic, extraoccular movements normal; neck is supple without thyromegally  Lynphnodes: Supraclavicular and inguinal nodes not enlarged  Abdomen: Soft, non-tender, no ascites, no organomegally, no masses, no hernias she has a well-healed scar at McBurney's point and small incisions from her tubal ligation and laparoscopic cholecystectomy, incision is well-healed. Pelvic:  EGBUS: Normal female  Vagina: Normal, no lesions  Urethra and Bladder: Normal, non-tender  Cervix: Surgically absent Uterus: Surgically absent. Bi-manual examination: Non-tender; no adenxal masses or nodularity  Rectal: normal sphincter tone, no masses, no blood  Lower extremities: No edema or varicosities. Normal range of motion      Allergies  Allergen Reactions  . Bactrim [Sulfamethoxazole-Trimethoprim] Other (See Comments)    Made her see things  . Ciprofloxacin Other (See Comments)    Made her see things    Past Medical History  Diagnosis Date  . Glaucoma     High pressure- ?pre glaucoma  . Cancer     ENDOMETRIAL CANCER    Past Surgical History  Procedure Laterality Date  . Cholecystectomy    . Appendectomy    . Abdominal hysterectomy N/A 03/01/2013    Procedure: HYSTERECTOMY ABDOMINAL TOTAL/BILATERAL  SALPINGO OOPHORECTOMY/WITH PELVIC AND PARA AORTIC LYMPHADNECTOMY ;  Surgeon: Alvino Chapel, MD;   Location: [facility];  Service: Gynecology;  Laterality: N/A;  . Salpingoophorectomy Bilateral 03/01/2013    Procedure: BILATERAL SALPINGO OOPHORECTOMY;  Surgeon: Alvino Chapel, MD;  Location: [facility];  Service: Gynecology;  Laterality: Bilateral;    Current Outpatient Prescriptions  Medication Sig Dispense Refill  . Brinzolamide-Brimonidine (SIMBRINZA) 1-0.2 % SUSP Place 1 drop into both eyes 2 (two) times daily.       Marland Kitchen dexamethasone (DECADRON) 4 MG tablet Take 5 tabs = 20 mg with food 12 hours and 6 hours prior to Taxol chemotherapy.  10 tablet  0  . ferrous fumarate (HEMOCYTE - 106 MG FE) 325 (106 FE) MG TABS tablet Hemocyte DAW 1 daily -iron for low hemoglobin.  30 each  2  . LORazepam (ATIVAN) 0.5 MG tablet Take 1 tablet under the tongue or swallow every 6 hours as needed for nausea  20 tablet  0  . ondansetron (ZOFRAN) 8 MG tablet Take 1-2 tablets (8-16 mg total) by mouth every 12 (twelve) hours as needed for nausea or vomiting.  20 tablet  1   No current facility-administered medications for this visit.    History   Social History  . Marital Status: Married    Spouse Name: N/A    Number of Children: 1  . Years of Education: N/A   Occupational History  . Not on file.   Social History Main Topics  . Smoking status: Never Smoker   . Smokeless tobacco: Never Used  . Alcohol Use: No  . Drug Use: No  . Sexual Activity: Not on file   Other Topics Concern  . Not on file   Social History Narrative  . No narrative on file    Family History  Problem Relation Age of Onset  . Breast cancer Maternal Aunt     x 4      Alvino Chapel, MD 06/29/2013, 11:31 AM

## 2013-06-30 ENCOUNTER — Ambulatory Visit
Admission: RE | Admit: 2013-06-30 | Discharge: 2013-06-30 | Disposition: A | Discharge status: Home / Self Care | Payer: Medicare Other | Source: Ambulatory Visit | Attending: Radiation Oncology | Admitting: Radiation Oncology

## 2013-06-30 DIAGNOSIS — Z510 Encounter for antineoplastic radiation therapy: Secondary | ICD-10-CM | POA: Diagnosis not present

## 2013-07-01 ENCOUNTER — Ambulatory Visit
Admission: RE | Admit: 2013-07-01 | Discharge: 2013-07-01 | Disposition: A | Discharge status: Home / Self Care | Payer: Medicare Other | Source: Ambulatory Visit | Attending: Radiation Oncology | Admitting: Radiation Oncology

## 2013-07-01 DIAGNOSIS — Z510 Encounter for antineoplastic radiation therapy: Secondary | ICD-10-CM | POA: Diagnosis not present

## 2013-07-04 ENCOUNTER — Other Ambulatory Visit: Payer: Self-pay | Admitting: Oncology

## 2013-07-04 DIAGNOSIS — C541 Malignant neoplasm of endometrium: Secondary | ICD-10-CM

## 2013-07-05 ENCOUNTER — Telehealth: Payer: Self-pay | Admitting: *Deleted

## 2013-07-05 ENCOUNTER — Ambulatory Visit
Admission: RE | Admit: 2013-07-05 | Discharge: 2013-07-05 | Disposition: A | Discharge status: Home / Self Care | Payer: Medicare Other | Source: Ambulatory Visit | Attending: Radiation Oncology | Admitting: Radiation Oncology

## 2013-07-05 VITALS — BP 126/88 | HR 86 | Temp 98.4°F | Ht 63.0 in | Wt 120.2 lb

## 2013-07-05 DIAGNOSIS — Z510 Encounter for antineoplastic radiation therapy: Secondary | ICD-10-CM | POA: Diagnosis not present

## 2013-07-05 DIAGNOSIS — C541 Malignant neoplasm of endometrium: Secondary | ICD-10-CM

## 2013-07-05 NOTE — Telephone Encounter (Signed)
Called patient to inform of HDR Case, no answer will call later.

## 2013-07-05 NOTE — Progress Notes (Signed)
Ebony Herring has had 23 fractions to her pelvis.  She denies pain, skin changes and bladder changes.  She reports feeling fatigued.  She reports diarrhea 2 times a day and says it is not bad enough to take Imodium.  She reports a poor appetite.  Her weight is the same as last week.  She denies having nausea.  She is wondering when her internal radiation treatments will be scheduled.

## 2013-07-05 NOTE — Progress Notes (Signed)
  Radiation Oncology         [phone] ________________________________  Name: Ebony Herring MRN: [id number]  Date: 07/05/2013  DOB: 1931/03/31  Weekly Radiation Therapy Management  Endometrial cancer   Primary site: Corpus Uteri - Carcinoma   Staging method: AJCC 7th Edition   Clinical: Stage IIIC2 (T3a, N2, M0)   Pathologic: Stage IIIC2 (T3a, N2, M0)   Summary: Stage IIIC2 (T3a, N2, M0)   Current Dose: 41.4 Gy     Planned Dose:  45 Gy  Narrative . . . . . . . . The patient presents for routine under treatment assessment.                                   The patient is without complaint except for fatigue. She has mild diarrhea but is not bothered with this issue.                                 Set-up films were reviewed.                                 The chart was checked. Physical Findings. . .  height is 5\' 3"  (1.6 m) and weight is 120 lb 3.2 oz (54.522 kg). Her oral temperature is 98.4 F (36.9 C). Her blood pressure is 126/88 and her pulse is 86. . Weight essentially stable.  No significant changes. Impression . . . . . . . The patient is tolerating radiation. Plan . . . . . . . . . . . . Continue treatment as planned. She will be scheduled for intracavitary brachytherapy treatments beginning late next week or the following week.  ________________________________   Blair Promise, PhD, MD

## 2013-07-05 NOTE — Telephone Encounter (Signed)
CALLED PATIENT TO INFORM OF APPTS., NO ANSWER WILL CALL LATER. 

## 2013-07-06 ENCOUNTER — Other Ambulatory Visit: Payer: Self-pay | Admitting: *Deleted

## 2013-07-06 ENCOUNTER — Encounter: Payer: Self-pay | Admitting: Oncology

## 2013-07-06 ENCOUNTER — Ambulatory Visit (HOSPITAL_BASED_OUTPATIENT_CLINIC_OR_DEPARTMENT_OTHER): Payer: Medicare Other | Admitting: Oncology

## 2013-07-06 ENCOUNTER — Other Ambulatory Visit (HOSPITAL_BASED_OUTPATIENT_CLINIC_OR_DEPARTMENT_OTHER): Payer: Medicare Other

## 2013-07-06 ENCOUNTER — Ambulatory Visit
Admission: RE | Admit: 2013-07-06 | Discharge: 2013-07-06 | Disposition: A | Discharge status: Home / Self Care | Payer: Medicare Other | Source: Ambulatory Visit | Attending: Radiation Oncology | Admitting: Radiation Oncology

## 2013-07-06 ENCOUNTER — Ambulatory Visit: Payer: Medicare Other

## 2013-07-06 VITALS — BP 129/65 | HR 86 | Temp 98.5°F | Resp 18 | Ht 63.0 in | Wt 119.0 lb

## 2013-07-06 DIAGNOSIS — C541 Malignant neoplasm of endometrium: Secondary | ICD-10-CM

## 2013-07-06 DIAGNOSIS — C549 Malignant neoplasm of corpus uteri, unspecified: Secondary | ICD-10-CM

## 2013-07-06 DIAGNOSIS — Z510 Encounter for antineoplastic radiation therapy: Secondary | ICD-10-CM | POA: Diagnosis not present

## 2013-07-06 DIAGNOSIS — E876 Hypokalemia: Secondary | ICD-10-CM

## 2013-07-06 LAB — COMPREHENSIVE METABOLIC PANEL (CC13)
ALBUMIN: 3.9 g/dL (ref 3.5–5.0)
ALK PHOS: 48 U/L (ref 40–150)
ALT: 9 U/L (ref 0–55)
AST: 16 U/L (ref 5–34)
Anion Gap: 15 mEq/L — ABNORMAL HIGH (ref 3–11)
BILIRUBIN TOTAL: 0.41 mg/dL (ref 0.20–1.20)
BUN: 14.6 mg/dL (ref 7.0–26.0)
CO2: 22 mEq/L (ref 22–29)
Calcium: 7.4 mg/dL — ABNORMAL LOW (ref 8.4–10.4)
Chloride: 106 mEq/L (ref 98–109)
Creatinine: 1.3 mg/dL — ABNORMAL HIGH (ref 0.6–1.1)
Glucose: 108 mg/dl (ref 70–140)
POTASSIUM: 2.2 meq/L — AB (ref 3.5–5.1)
SODIUM: 142 meq/L (ref 136–145)
TOTAL PROTEIN: 6.6 g/dL (ref 6.4–8.3)

## 2013-07-06 LAB — CBC WITH DIFFERENTIAL/PLATELET
BASO%: 0.7 % (ref 0.0–2.0)
Basophils Absolute: 0 10e3/uL (ref 0.0–0.1)
EOS%: 5.1 % (ref 0.0–7.0)
Eosinophils Absolute: 0.1 10e3/uL (ref 0.0–0.5)
HCT: 28.6 % — ABNORMAL LOW (ref 34.8–46.6)
HGB: 9.9 g/dL — ABNORMAL LOW (ref 11.6–15.9)
LYMPH%: 14.7 % (ref 14.0–49.7)
MCH: 34 pg (ref 25.1–34.0)
MCHC: 34.6 g/dL (ref 31.5–36.0)
MCV: 98.3 fL (ref 79.5–101.0)
MONO#: 0.5 10e3/uL (ref 0.1–0.9)
MONO%: 15.5 % — ABNORMAL HIGH (ref 0.0–14.0)
NEUT#: 1.9 10e3/uL (ref 1.5–6.5)
NEUT%: 64 % (ref 38.4–76.8)
Platelets: 307 10e3/uL (ref 145–400)
RBC: 2.91 10e6/uL — ABNORMAL LOW (ref 3.70–5.45)
RDW: 15 % — ABNORMAL HIGH (ref 11.2–14.5)
WBC: 2.9 10e3/uL — ABNORMAL LOW (ref 3.9–10.3)
lymph#: 0.4 10e3/uL — ABNORMAL LOW (ref 0.9–3.3)

## 2013-07-06 MED ORDER — POTASSIUM CHLORIDE ER 10 MEQ PO TBCR: EXTENDED_RELEASE_TABLET | ORAL | Status: DC

## 2013-07-06 MED ORDER — POTASSIUM CHLORIDE CRYS ER 20 MEQ PO TBCR
20.0000 meq | EXTENDED_RELEASE_TABLET | Freq: Once | ORAL | Status: AC
  Administered 2013-07-06: 20 meq via ORAL
  Filled 2013-07-06: qty 1

## 2013-07-06 NOTE — Progress Notes (Signed)
OFFICE PROGRESS NOTE   07/06/2013   Physicians:Daniel ClarkePearson, [name] F (PCP [facility]), Gery Pray, Everlene Farrier, Donato Heinz (ophth   INTERVAL HISTORY:   Patient is seen, together with husband, as she is completing radiation therapy and will resume chemotherapy with 3 additional cycles of taxane + carboplatin after radiation. Last IMRT will be 07-07-13, then HDR thru 07-27-13. She has had fatigue and some diarrhea with the radiation, and is hypokalemic by chemistries resulting during visit today. She has not tried imodium. She is eating and is drinking fluids.   Peripheral IV access is not adequate for further chemotherapy; she is in agreement with placement of [name] by IR, tho she prefers to wait to have this set up until radiation is completed.  ONCOLOGIC HISTORY Patient presented to PCP with vaginal discharge with some bleeding since Nov 2014. 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 MRI pelvis 01-20-13 Had uterine mass and prominent bilateral iliac nodes. She had TAH BSO and pelvic and para-aortic lymphadenectomy by Dr Josephina Shih on 03-01-2013. 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 At completion of surgery there was no gross residual disease. She had 3 cycles of taxol carboplatin from 03-28-13 thru 05-09-2013, with neupogen support.  Review of systems as above, also: No fever or symptoms of infection. No SOB. No LE swelling. No vomiting. No dysuria Remainder of 10 point Review of Systems negative.  Objective:  Vital signs in last 24 hours:  BP 129/65  Pulse 86  Temp(Src) 98.5 F (36.9 C) (Oral)  Resp 18  Ht 5\' 3"  (1.6 m)  Wt 119 lb (53.978 kg)  BMI 21.09 kg/m2  Weight is down  nearly 10 lbs from 05-23-13.  Alert, oriented and appropriate. Ambulatory without difficulty.  Alopecia  HEENT:PERRL, sclerae not icteric. Oral mucosa moist without lesions, posterior pharynx clear.  Neck supple. No JVD.  Lymphatics:no cervical,suraclavicular adenopathy Resp: clear to auscultation bilaterally and normal percussion bilaterally Cardio: regular rate and rhythm. No gallop. GI: soft, nontender, not distended, no mass or organomegaly. Some bowel sounds. Surgical incision not remarkable. Musculoskeletal/ Extremities: without pitting edema, cords, tenderness Neuro: nonfocal Skin without rash, ecchymosis, petechiae   Lab Results:  Results for orders placed in visit on 07/06/13  CBC WITH DIFFERENTIAL      Result Value Ref Range   WBC 2.9 (*) 3.9 - 10.3 10e3/uL   NEUT# 1.9  1.5 - 6.5 10e3/uL   HGB 9.9 (*) 11.6 - 15.9 g/dL   HCT 28.6 (*) 34.8 - 46.6 %   Platelets 307  145 - 400 10e3/uL   MCV 98.3  79.5 - 101.0 fL   MCH 34.0  25.1 - 34.0 pg   MCHC 34.6  31.5 - 36.0 g/dL   RBC 2.91 (*) 3.70 - 5.45 10e6/uL   RDW 15.0 (*) 11.2 - 14.5 %   lymph# 0.4 (*) 0.9 - 3.3 10e3/uL   MONO# 0.5  0.1 - 0.9 10e3/uL   Eosinophils Absolute 0.1  0.0 - 0.5 10e3/uL   Basophils Absolute 0.0  0.0 - 0.1 10e3/uL   NEUT% 64.0  38.4 - 76.8 %   LYMPH% 14.7  14.0 - 49.7 %   MONO% 15.5 (*) 0.0 - 14.0 %   EOS% 5.1  0.0 - 7.0 %   BASO% 0.7  0.0 - 2.0 %  COMPREHENSIVE METABOLIC PANEL (FI43)      Result Value Ref Range   Sodium 142  136 - 145 mEq/L   Potassium 2.2 (*) 3.5 - 5.1 mEq/L   Chloride 106  98 - 109 mEq/L   CO2 22  22 - 29 mEq/L   Glucose 108  70 - 140 mg/dl   BUN 14.6  7.0 - 26.0 mg/dL   Creatinine 1.3 (*) 0.6 - 1.1 mg/dL   Total Bilirubin 0.41  0.20 - 1.20 mg/dL   Alkaline Phosphatase 48  40 - 150 U/L   AST 16  5 - 34 U/L   ALT 9  0 - 55 U/L   Total Protein 6.6  6.4 - 8.3 g/dL   Albumin 3.9  3.5 - 5.0 g/dL   Calcium 7.4 (*) 8.4 - 10.4 mg/dL   Anion Gap 15 (*) 3 - 11 mEq/L    See  below for K+. Will repeat chemistries with RT visit next week.  Studies/Results:  No results found.  Medications: I have reviewed the patient's current medications. She has been given potassium 20 mEq po at office now and will take additional 20 mEq x 2 today then 20 mEq bid. She is encouraged to use imodium to improve diarrhea, especially with low potassium.  DISCUSSION: patient and husband understand recommendation for 3 additional cycles of chemotherapy after RT, and need for [name]. She prefers not to have [name] scheduled yet, so will still need to do this after RT. I will see her back ~ a week after HDR completes and set up [facility] and Rx from there. They understand importance of supplementing potassium and using antidiarrheal medication.  Assessment/Plan:  1.IIIC2 high grade serous endometrial carcinoma: surgery 03-01-13, 3 cycles of taxol carboplatin 03-28-13 thru 05-09-13 with gCSF required,  RT by Dr Sondra Come to finish mid June, then additional 3 cycles chemo.  2.Difficult peripheral IV access: very high risk disease, so reasonable to place [name] even tho only 3 cycles remain .  3.hypokalemia: secondary to RT diarrhea. Plan as above. Weight is also down and she understands that she should push fluids. Will repeat BMET with RT appointment 07-13-13. 4.several UTIs around surgery, seem to be cleared now.  5.anemia: related to chemo, as above  6.glaucoma  7.no mammograms in years, no colonoscopy   Patient and husband understood discussion and are in agreement with plan. Husband to pick up K+ and imodium in pill form from pharmacy this afternoon.    Gordy Levan, MD   07/06/2013, 4:50 PM

## 2013-07-07 ENCOUNTER — Ambulatory Visit
Admission: RE | Admit: 2013-07-07 | Discharge: 2013-07-07 | Disposition: A | Discharge status: Home / Self Care | Payer: Medicare Other | Source: Ambulatory Visit | Attending: Radiation Oncology | Admitting: Radiation Oncology

## 2013-07-07 DIAGNOSIS — Z510 Encounter for antineoplastic radiation therapy: Secondary | ICD-10-CM | POA: Diagnosis not present

## 2013-07-08 ENCOUNTER — Ambulatory Visit: Payer: Medicare Other

## 2013-07-13 ENCOUNTER — Ambulatory Visit
Admission: RE | Admit: 2013-07-13 | Discharge: 2013-07-13 | Disposition: A | Discharge status: Home / Self Care | Payer: Medicare Other | Source: Ambulatory Visit | Attending: Radiation Oncology | Admitting: Radiation Oncology

## 2013-07-13 ENCOUNTER — Encounter (HOSPITAL_COMMUNITY): Payer: Self-pay | Admitting: Emergency Medicine

## 2013-07-13 ENCOUNTER — Other Ambulatory Visit: Payer: Self-pay

## 2013-07-13 ENCOUNTER — Ambulatory Visit (HOSPITAL_BASED_OUTPATIENT_CLINIC_OR_DEPARTMENT_OTHER): Payer: Medicare Other

## 2013-07-13 ENCOUNTER — Inpatient Hospital Stay (HOSPITAL_COMMUNITY)
Admission: EM | Admit: 2013-07-13 | Discharge: 2013-07-16 | DRG: 393 | Disposition: A | Discharge status: Home / Self Care | Payer: Medicare Other | Attending: Internal Medicine | Admitting: Internal Medicine

## 2013-07-13 ENCOUNTER — Other Ambulatory Visit: Payer: Self-pay | Admitting: *Deleted

## 2013-07-13 ENCOUNTER — Telehealth: Payer: Self-pay | Admitting: *Deleted

## 2013-07-13 ENCOUNTER — Telehealth: Payer: Self-pay | Admitting: Oncology

## 2013-07-13 ENCOUNTER — Encounter: Payer: Self-pay | Admitting: Radiation Oncology

## 2013-07-13 VITALS — BP 99/71 | HR 89 | Temp 98.9°F | Wt 116.3 lb

## 2013-07-13 DIAGNOSIS — C541 Malignant neoplasm of endometrium: Secondary | ICD-10-CM

## 2013-07-13 DIAGNOSIS — E872 Acidosis: Secondary | ICD-10-CM

## 2013-07-13 DIAGNOSIS — IMO0002 Reserved for concepts with insufficient information to code with codable children: Secondary | ICD-10-CM

## 2013-07-13 DIAGNOSIS — E8729 Other acidosis: Secondary | ICD-10-CM

## 2013-07-13 DIAGNOSIS — E43 Unspecified severe protein-calorie malnutrition: Secondary | ICD-10-CM | POA: Diagnosis present

## 2013-07-13 DIAGNOSIS — Y842 Radiological procedure and radiotherapy as the cause of abnormal reaction of the patient, or of later complication, without mention of misadventure at the time of the procedure: Secondary | ICD-10-CM | POA: Diagnosis present

## 2013-07-13 DIAGNOSIS — C549 Malignant neoplasm of corpus uteri, unspecified: Secondary | ICD-10-CM | POA: Diagnosis present

## 2013-07-13 DIAGNOSIS — K520 Gastroenteritis and colitis due to radiation: Principal | ICD-10-CM | POA: Diagnosis present

## 2013-07-13 DIAGNOSIS — E860 Dehydration: Secondary | ICD-10-CM | POA: Diagnosis present

## 2013-07-13 DIAGNOSIS — E876 Hypokalemia: Secondary | ICD-10-CM | POA: Diagnosis present

## 2013-07-13 DIAGNOSIS — Z79899 Other long term (current) drug therapy: Secondary | ICD-10-CM

## 2013-07-13 DIAGNOSIS — H409 Unspecified glaucoma: Secondary | ICD-10-CM | POA: Diagnosis present

## 2013-07-13 DIAGNOSIS — R197 Diarrhea, unspecified: Secondary | ICD-10-CM | POA: Diagnosis present

## 2013-07-13 DIAGNOSIS — N390 Urinary tract infection, site not specified: Secondary | ICD-10-CM | POA: Diagnosis present

## 2013-07-13 DIAGNOSIS — N179 Acute kidney failure, unspecified: Secondary | ICD-10-CM | POA: Diagnosis present

## 2013-07-13 LAB — COMPREHENSIVE METABOLIC PANEL
ALBUMIN: 4.3 g/dL (ref 3.5–5.2)
ALT: 9 U/L (ref 0–35)
AST: 18 U/L (ref 0–37)
Alkaline Phosphatase: 51 U/L (ref 39–117)
BUN: 18 mg/dL (ref 6–23)
CALCIUM: 6.8 mg/dL — AB (ref 8.4–10.5)
CO2: 19 mEq/L (ref 19–32)
Chloride: 101 mEq/L (ref 96–112)
Creatinine, Ser: 1.71 mg/dL — ABNORMAL HIGH (ref 0.50–1.10)
GFR calc Af Amer: 32 mL/min — ABNORMAL LOW (ref >90)
GFR calc non Af Amer: 27 mL/min — ABNORMAL LOW (ref >90)
Glucose, Bld: 145 mg/dL — ABNORMAL HIGH (ref 70–99)
Potassium: <2.2 mEq/L — CL (ref 3.7–5.3)
Sodium: 141 mEq/L (ref 137–147)
Total Bilirubin: 0.5 mg/dL (ref 0.3–1.2)
Total Protein: 7.3 g/dL (ref 6.0–8.3)

## 2013-07-13 LAB — CBC WITH DIFFERENTIAL/PLATELET
BASOS ABS: 0 10*3/uL (ref 0.0–0.1)
BASOS PCT: 1 % (ref 0–1)
Basophils Absolute: 0 10*3/uL (ref 0.0–0.1)
Basophils Relative: 0 % (ref 0–1)
EOS PCT: 1 % (ref 0–5)
Eosinophils Absolute: 0 10*3/uL (ref 0.0–0.7)
Eosinophils Absolute: 0 10*3/uL (ref 0.0–0.7)
Eosinophils Relative: 0 % (ref 0–5)
HCT: 28.1 % — ABNORMAL LOW (ref 36.0–46.0)
HCT: 25.8 % — ABNORMAL LOW (ref 36.0–46.0)
Hemoglobin: 10.3 g/dL — ABNORMAL LOW (ref 12.0–15.0)
Hemoglobin: 9.4 g/dL — ABNORMAL LOW (ref 12.0–15.0)
LYMPHS ABS: 0.6 10*3/uL — AB (ref 0.7–4.0)
LYMPHS PCT: 12 % (ref 12–46)
Lymphocytes Relative: 11 % — ABNORMAL LOW (ref 12–46)
Lymphs Abs: 0.5 10*3/uL — ABNORMAL LOW (ref 0.7–4.0)
MCH: 33.8 pg (ref 26.0–34.0)
MCH: 33.9 pg (ref 26.0–34.0)
MCHC: 36.7 g/dL — AB (ref 30.0–36.0)
MCHC: 36.4 g/dL — ABNORMAL HIGH (ref 30.0–36.0)
MCV: 92.1 fL (ref 78.0–100.0)
MCV: 93.1 fL (ref 78.0–100.0)
Monocytes Absolute: 0.5 10*3/uL (ref 0.1–1.0)
Monocytes Absolute: 0.6 10*3/uL (ref 0.1–1.0)
Monocytes Relative: 12 % (ref 3–12)
Monocytes Relative: 11 % (ref 3–12)
NEUTROS ABS: 3.3 10*3/uL (ref 1.7–7.7)
NEUTROS PCT: 77 % (ref 43–77)
Neutro Abs: 4 10*3/uL (ref 1.7–7.7)
Neutrophils Relative %: 77 % (ref 43–77)
Platelets: 305 10*3/uL (ref 150–400)
Platelets: 262 10*3/uL (ref 150–400)
RBC: 3.05 MIL/uL — ABNORMAL LOW (ref 3.87–5.11)
RBC: 2.77 MIL/uL — AB (ref 3.87–5.11)
RDW: 14 % (ref 11.5–15.5)
RDW: 14.1 % (ref 11.5–15.5)
WBC: 4.3 10*3/uL (ref 4.0–10.5)
WBC: 5.2 10*3/uL (ref 4.0–10.5)

## 2013-07-13 LAB — BASIC METABOLIC PANEL (CC13)
ANION GAP: 20 meq/L — AB (ref 3–11)
BUN: 16.6 mg/dL (ref 7.0–26.0)
CO2: 16 mEq/L — ABNORMAL LOW (ref 22–29)
Calcium: 7 mg/dL — ABNORMAL LOW (ref 8.4–10.4)
Chloride: 105 mEq/L (ref 98–109)
Creatinine: 1.7 mg/dL — ABNORMAL HIGH (ref 0.6–1.1)
GLUCOSE: 122 mg/dL (ref 70–140)
Sodium: 141 mEq/L (ref 136–145)

## 2013-07-13 LAB — APTT: APTT: 25 s (ref 24–37)

## 2013-07-13 LAB — PROTIME-INR
INR: 1.2 (ref 0.00–1.49)
Prothrombin Time: 14.9 seconds (ref 11.6–15.2)

## 2013-07-13 LAB — BASIC METABOLIC PANEL
BUN: 17 mg/dL (ref 6–23)
CHLORIDE: 104 meq/L (ref 96–112)
CO2: 19 mEq/L (ref 19–32)
Calcium: 6.3 mg/dL — CL (ref 8.4–10.5)
Creatinine, Ser: 1.48 mg/dL — ABNORMAL HIGH (ref 0.50–1.10)
GFR calc Af Amer: 38 mL/min — ABNORMAL LOW (ref >90)
GFR calc non Af Amer: 32 mL/min — ABNORMAL LOW (ref >90)
Glucose, Bld: 113 mg/dL — ABNORMAL HIGH (ref 70–99)
SODIUM: 141 meq/L (ref 137–147)

## 2013-07-13 LAB — TSH: TSH: 4.71 u[IU]/mL — AB (ref 0.350–4.500)

## 2013-07-13 LAB — PHOSPHORUS: Phosphorus: 1.8 mg/dL — ABNORMAL LOW (ref 2.3–4.6)

## 2013-07-13 LAB — MAGNESIUM: Magnesium: 0.8 mg/dL — CL (ref 1.5–2.5)

## 2013-07-13 MED ORDER — POTASSIUM CHLORIDE 10 MEQ/100ML IV SOLN
10.0000 meq | INTRAVENOUS | Status: AC
  Administered 2013-07-13 (×3): 10 meq via INTRAVENOUS
  Filled 2013-07-13 (×3): qty 100

## 2013-07-13 MED ORDER — ACETAMINOPHEN 325 MG PO TABS: 650.0000 mg | ORAL_TABLET | Freq: Four times a day (QID) | ORAL | Status: DC | PRN

## 2013-07-13 MED ORDER — ACETAMINOPHEN 650 MG RE SUPP: 650.0000 mg | Freq: Four times a day (QID) | RECTAL | Status: DC | PRN

## 2013-07-13 MED ORDER — SODIUM CHLORIDE 0.9 % IV SOLN
1.0000 g | Freq: Once | INTRAVENOUS | Status: AC
  Administered 2013-07-14: 1 g via INTRAVENOUS
  Filled 2013-07-13: qty 10

## 2013-07-13 MED ORDER — POTASSIUM CHLORIDE IN NACL 20-0.9 MEQ/L-% IV SOLN
INTRAVENOUS | Status: DC
  Administered 2013-07-13 – 2013-07-14 (×2): via INTRAVENOUS
  Filled 2013-07-13 (×3): qty 1000

## 2013-07-13 MED ORDER — CALCIUM CARBONATE-VITAMIN D 500-200 MG-UNIT PO TABS
1.0000 | ORAL_TABLET | Freq: Every day | ORAL | Status: DC
  Administered 2013-07-14 – 2013-07-16 (×2): 1 via ORAL
  Filled 2013-07-13 (×4): qty 1

## 2013-07-13 MED ORDER — ENOXAPARIN SODIUM 30 MG/0.3ML ~~LOC~~ SOLN
30.0000 mg | SUBCUTANEOUS | Status: DC
  Administered 2013-07-13 – 2013-07-15 (×2): 30 mg via SUBCUTANEOUS
  Filled 2013-07-13 (×4): qty 0.3

## 2013-07-13 MED ORDER — POTASSIUM CHLORIDE CRYS ER 20 MEQ PO TBCR
40.0000 meq | EXTENDED_RELEASE_TABLET | Freq: Once | ORAL | Status: AC
  Administered 2013-07-13: 40 meq via ORAL
  Filled 2013-07-13: qty 2

## 2013-07-13 MED ORDER — ONDANSETRON HCL 4 MG PO TABS: 8.0000 mg | ORAL_TABLET | Freq: Two times a day (BID) | ORAL | Status: DC | PRN

## 2013-07-13 MED ORDER — POTASSIUM CHLORIDE 10 MEQ/100ML IV SOLN
10.0000 meq | INTRAVENOUS | Status: AC
  Administered 2013-07-13 – 2013-07-14 (×3): 10 meq via INTRAVENOUS
  Filled 2013-07-13 (×3): qty 100

## 2013-07-13 MED ORDER — ONDANSETRON HCL 4 MG/2ML IJ SOLN
4.0000 mg | Freq: Once | INTRAMUSCULAR | Status: AC
  Administered 2013-07-13: 4 mg via INTRAVENOUS
  Filled 2013-07-13: qty 2

## 2013-07-13 MED ORDER — LORAZEPAM 0.5 MG PO TABS: 0.5000 mg | ORAL_TABLET | Freq: Four times a day (QID) | ORAL | Status: DC | PRN

## 2013-07-13 MED ORDER — ONDANSETRON HCL 4 MG/2ML IJ SOLN: 4.0000 mg | Freq: Four times a day (QID) | INTRAMUSCULAR | Status: DC | PRN

## 2013-07-13 MED ORDER — ONDANSETRON HCL 4 MG PO TABS: 4.0000 mg | ORAL_TABLET | Freq: Four times a day (QID) | ORAL | Status: DC | PRN

## 2013-07-13 MED ORDER — MAGNESIUM SULFATE 4000MG/100ML IJ SOLN
4.0000 g | Freq: Once | INTRAMUSCULAR | Status: AC
  Administered 2013-07-13: 4 g via INTRAVENOUS
  Filled 2013-07-13: qty 100

## 2013-07-13 MED ORDER — SODIUM CHLORIDE 0.9 % IJ SOLN
3.0000 mL | Freq: Two times a day (BID) | INTRAMUSCULAR | Status: DC
  Administered 2013-07-13 – 2013-07-16 (×2): 3 mL via INTRAVENOUS

## 2013-07-13 MED ORDER — HYDROCODONE-ACETAMINOPHEN 5-325 MG PO TABS
1.0000 | ORAL_TABLET | ORAL | Status: DC | PRN
  Administered 2013-07-16: 2 via ORAL
  Filled 2013-07-13: qty 2

## 2013-07-13 MED ORDER — SODIUM CHLORIDE 0.9 % IV BOLUS (SEPSIS)
1000.0000 mL | Freq: Once | INTRAVENOUS | Status: AC
  Administered 2013-07-13: 1000 mL via INTRAVENOUS

## 2013-07-13 NOTE — ED Notes (Signed)
Pt directed to come to ED by Marko Plume, MD due to Potassium 2.0 and dehydration.  Hx of endometrial CA receiving radiation treatments.

## 2013-07-13 NOTE — Progress Notes (Signed)
  Radiation Oncology         [phone] ________________________________  Name: Ebony Herring MRN: [id number]  Date: 07/13/2013  DOB: 04/19/1931  Simple treatment device note  CC: [name], MD  Gordy Levan, MD  Diagnosis:   Endometrial cancer   Primary site: Corpus Uteri - Carcinoma   Staging method: AJCC 7th Edition   Clinical: Stage IIIC2 (T3a, N2, M0)   Pathologic: Stage IIIC2 (T3a, N2, M0)   Summary: Stage IIIC2 (T3a, N2, M0)   Patient underwent fitting for her custom vaginal cylinder to be used for her high-dose-rate treatment. The optimal arrangement was three 3.0 cm rings placed proximally within the vaginal vault. More distally two 2.5 cm rings placed. This distended the vaginal vault without undue discomfort.     ____________________________________ Blair Promise, MD

## 2013-07-13 NOTE — Progress Notes (Signed)
  Radiation Oncology         [phone] ________________________________  Name: Ebony Herring MRN: [id number]  Date: 07/13/2013  DOB: 05-24-1931  SIMULATION AND TREATMENT PLANNING NOTE  DIAGNOSIS:  Endometrial cancer   Primary site: Corpus Uteri - Carcinoma   Staging method: AJCC 7th Edition   Clinical: Stage IIIC2 (T3a, N2, M0)   Pathologic: Stage IIIC2 (T3a, N2, M0)   Summary: Stage IIIC2 (T3a, N2, M0)   NARRATIVE:  The patient was brought to the [facility].  Identity was confirmed.  All relevant records and images related to the planned course of therapy were reviewed.  The patient freely provided informed written consent to proceed with treatment after reviewing the details related to the planned course of therapy. The consent form was witnessed and verified by the simulation staff.  Then, the patient was set-up in a stable reproducible  supine position for radiation therapy.  the patient's vaginal cylinder was reinserted without difficulty and affixed to the CT/MR stabilization plate to prevent slippage. CT images were obtained.  The CT images were loaded into the planning software.  Then the target and avoidance structures were contoured.  Treatment planning then occurred.  The radiation prescription was entered and confirmed. The patient will proceed with planning for her high-dose-rate treatment. She will receive 3 weekly treatments with a dose of 6 Gy to the proximal vaginal mucosal surface.  PLAN:  The patient will receive 18 Gy in 3 fractions with iridium 192 as the high-dose-rate source.  ________________________________  -----------------------------------  Blair Promise, PhD, MD

## 2013-07-13 NOTE — Progress Notes (Signed)
CRITICAL VALUE ALERT  Critical value received:  Ca 6.3, Mg 0.8, K <2.2  Date of notification:  07/13/2013  Time of notification:  2130  Critical value read back:yes  Nurse who received alert:  L. Vergel de Larkin Ina RN  MD notified (1st page):  Lamar Blinks NP  Time of first page:  2137  MD notified (2nd page): Lamar Blinks NP  Time of second page:  Responding MD:  2230  Time MD responded:  2240

## 2013-07-13 NOTE — Progress Notes (Signed)
  Radiation Oncology         [phone] ________________________________  Name: CARLISSA PESOLA MRN: [id number]  Date: 07/13/2013  DOB: July 25, 1931  High-dose-rate brachytherapy procedure note  Status: outpatient   After documentation of accurate placement of the vaginal cylinder the high-dose-rate remote afterloading unit was affixed to the vaginal cylinder by catheter system. Patient then proceeded to undergo her first high-dose-rate treatment directed at the proximal vagina. Patient was prescribed a dose of 6 Gy to be delivered to the vaginal mucosal surface.  This was Achieved with one channel using 7 dwell positions. Treatment length was 3 cm. Iridium 192 was the high-dose-rate source. Treatment time was 254.4 seconds. Patient tolerated the procedure well. After completion of her therapy a radiation survey was performed documenting return of the iridium source into the Nucletron safe period. The patient will return next week for her second high-dose-rate treatment.  -----------------------------------  Blair Promise, PhD, MD

## 2013-07-13 NOTE — ED Notes (Signed)
Pt seen by Dr. Marko Plume today.  Was told to come to ED for critical potassium

## 2013-07-13 NOTE — ED Notes (Signed)
Pt is aware of the need for urine sample.  

## 2013-07-13 NOTE — ED Notes (Signed)
Pt still unable to void at this time, will collect urine when she is able.

## 2013-07-13 NOTE — Progress Notes (Signed)
Patient here for HDR planning and fitting.Explained process and procedure as well as gave patient a calendar printout for the scheduled treatments.Patient is scheduled for 3 weekly treatments.Denies pain.No nausea or vomiting.States the only side effects from external beam radiation is fatigue and mild diarrhea which is resolving.

## 2013-07-13 NOTE — ED Provider Notes (Signed)
CSN: [id number]     Arrival date & time 07/13/13  1607 History   First MD Initiated Contact with Patient 07/13/13 1634     Chief Complaint  Patient presents with  . Abnormal Lab     (Consider location/radiation/quality/duration/timing/severity/associated sxs/prior Treatment) HPI Ebony Herring is a 80 y.o. female who presents emergency department do to abnormal blood work that was done today at her doctor's office. Patient is currently being treated for endometrial cancer, finished 3 rounds of chemotherapy, currently on radiation. Last radiation treatment this afternoon. States she had blood work done by her oncologist, and to call when she got home that she needed to come to emergency department because of low potassium and dehydration. Patient states she's feeling well. She reports generalized weakness since starting radiation. She states that she was told her potassium is low last week and she has been taking potassium pills twice a day. She states she has been compliant with taking them. She denies any worsening weakness, no nausea, vomiting. She states her stools are loose, but states that is because of radiation. She denies any pain anywhere. She denies any fever or chills. No urinary symptoms. No chest pain or shortness of breath  Past Medical History  Diagnosis Date  . Glaucoma     High pressure- ?pre glaucoma  . Cancer     ENDOMETRIAL CANCER   Past Surgical History  Procedure Laterality Date  . Cholecystectomy    . Appendectomy    . Abdominal hysterectomy N/A 03/01/2013    Procedure: HYSTERECTOMY ABDOMINAL TOTAL/BILATERAL SALPINGO OOPHORECTOMY/WITH PELVIC AND PARA AORTIC LYMPHADNECTOMY ;  Surgeon: Alvino Chapel, MD;  Location: [facility];  Service: Gynecology;  Laterality: N/A;  . Salpingoophorectomy Bilateral 03/01/2013    Procedure: BILATERAL SALPINGO OOPHORECTOMY;  Surgeon: Alvino Chapel, MD;  Location: [facility];  Service: Gynecology;  Laterality: Bilateral;    Family History  Problem Relation Age of Onset  . Breast cancer Maternal Aunt     x 4   History  Substance Use Topics  . Smoking status: Never Smoker   . Smokeless tobacco: Never Used  . Alcohol Use: No   OB History   Grav Para Term Preterm Abortions TAB SAB Ect Mult Living                 Review of Systems  Constitutional: Negative for fever and chills.  Respiratory: Negative for cough, chest tightness and shortness of breath.   Cardiovascular: Negative for chest pain, palpitations and leg swelling.  Gastrointestinal: Positive for diarrhea. Negative for nausea, vomiting and abdominal pain.  Genitourinary: Negative for dysuria, flank pain and pelvic pain.  Musculoskeletal: Negative for arthralgias, myalgias, neck pain and neck stiffness.  Skin: Negative for rash.  Neurological: Positive for weakness. Negative for dizziness and headaches.  All other systems reviewed and are negative.     Allergies  Bactrim and Ciprofloxacin  Home Medications   Prior to Admission medications   Medication Sig Start Date End Date Taking? Authorizing Provider  Brinzolamide-Brimonidine [facility]) 1-0.2 % SUSP Place 1 drop into both eyes 2 (two) times daily.     Historical Provider, MD  dexamethasone (DECADRON) 4 MG tablet Take 5 tabs = 20 mg with food 12 hours and 6 hours prior to Taxol chemotherapy. 04/28/13   Lennis Marion Downer, MD  ferrous fumarate (HEMOCYTE - 106 MG FE) 325 (106 FE) MG TABS tablet Hemocyte DAW 1 daily -iron for low hemoglobin. 05/23/13   Lennis Marion Downer, MD  LORazepam (  ATIVAN) 0.5 MG tablet Take 1 tablet under the tongue or swallow every 6 hours as needed for nausea 03/14/13   Lennis P Livesay, MD  ondansetron (ZOFRAN) 8 MG tablet Take 1-2 tablets (8-16 mg total) by mouth every 12 (twelve) hours as needed for nausea or vomiting. 03/14/13   Lennis Marion Downer, MD  potassium chloride (K-DUR) 10 MEQ tablet Take 2 tablets this afternoon, 2 this PM and then take 2 tablets twice a day  07/06/13   Lennis P Livesay, MD   BP 133/82  Pulse 90  Temp(Src) 98.2 F (36.8 C) (Oral)  Resp 18  SpO2 99% Physical Exam  Nursing note and vitals reviewed. Constitutional: She is oriented to person, place, and time. She appears well-developed and well-nourished. No distress.  HENT:  Head: Normocephalic.  Eyes: Conjunctivae are normal.  Neck: Neck supple.  Cardiovascular: Normal rate, regular rhythm and normal heart sounds.   Pulmonary/Chest: Effort normal and breath sounds normal. No respiratory distress. She has no wheezes. She has no rales.  Abdominal: Soft. Bowel sounds are normal. She exhibits no distension. There is no tenderness. There is no rebound.  Musculoskeletal: She exhibits no edema.  Neurological: She is alert and oriented to person, place, and time.  Skin: Skin is warm and dry.  Psychiatric: She has a normal mood and affect. Her behavior is normal.    ED Course  Procedures (including critical care time) Labs Review Labs Reviewed  CBC WITH DIFFERENTIAL - Abnormal; Notable for the following:    RBC 3.05 (*)    Hemoglobin 10.3 (*)    HCT 28.1 (*)    MCHC 36.7 (*)    Lymphocytes Relative 11 (*)    Lymphs Abs 0.5 (*)    All other components within normal limits  COMPREHENSIVE METABOLIC PANEL - Abnormal; Notable for the following:    Potassium <2.2 (*)    Glucose, Bld 145 (*)    Creatinine, Ser 1.71 (*)    Calcium 6.8 (*)    GFR calc non Af Amer 27 (*)    GFR calc Af Amer 32 (*)    All other components within normal limits  URINE CULTURE  URINALYSIS, ROUTINE W REFLEX MICROSCOPIC    Imaging Review No results found.   EKG Interpretation None       Date: 07/13/2013  Rate: 69  Rhythm: normal sinus rhythm  QRS Axis: normal  Intervals: normal  ST/T Wave abnormalities: ST depressions diffusely  Conduction Disutrbances:none  Narrative Interpretation:   Old EKG Reviewed: none available   MDM   Final diagnoses:  Hypokalemia  Diarrhea  Dehydration   High anion gap metabolic acidosis    Patient with potassium of 2 checked earlier today, elevated creatinine to 1.7, patient's baseline is 1. Anion gap of 20. Will recheck blood work here. IV fluids started. Patient has no complaints.  6:09 PM Potassium less than 2.2 here. Patient has no complaints. Patient also has mildly elevated creatinine 1.7, anion gap of 21. IV fluids started. Ordered 40 mEq of potassium by mouth as well as 3 runs of 10 mEq of potassium IV. Patient may have been noncompliant with her potassium at home because it's making her stomach upset. She denies any nausea vomiting, continues to persist and diarrhea. Spoke with [facility]ad will admit patient.  Filed Vitals:   07/13/13 1615  BP: 133/82  Pulse: 90  Temp: 98.2 F (36.8 C)  TempSrc: Oral  Resp: 18  SpO2: 99%     Shontez Sermon A Dondre Catalfamo,  PA-C 07/13/13 1812

## 2013-07-13 NOTE — H&P (Signed)
[facility] History and Physical  Ebony Herring NTI:[id number] DOB: [birth year] DOA: 07/13/2013  Referring physician: ER physician PCP: [name], MD   Chief Complaint: diarrhea  HPI:  80 year old female with past medical history of endometrial carcinoma, has completed 3 cycles of carboplatin and Taxol and is receiving extended field radiation therapy, will resume chemotherapy with 3 additional cycles of taxane + carboplatin after radiation. Last IMRT 07-07-13, then HDR thru 07-27-13, under Dr. Clabe Seal care, has been seen in Dr. Mariana Kaufman office and was found to have critically low potassium of less than 2 and has had intractable diarrhea. Pt reported feeling fatigues and weak. No reports of fever or chills. No abdominal pain, nausea or vomiting. No chest pain, no palpitations or shortness of breath. In Ed, pt was found to have potassium of less than 2.2. She received 40 meq potassium chloride by mouth and 3 doses of 10 meq potassium through IV. She is admitted for further evaluation and management of low potassium diarrhea and dehydration.    Assessment & Plan    Principal Problem:   Hypokalemia - likely due to diarrhea; she is on home potaasium supplementation but reported having abdominal discomfort with PO potassium and has not been taking it consistently - no complaints of chest pain - 12 lead EKG showed sinus rhythm - she was given total of 70 meq of potassium in ED - we started IV fluids on admission with potassium supplementation and close monitoring of BMP; admission BMP ordered again as well. 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  EKG: normal sinus rhythm  Code Status: Full Family Communication: Plan of care discussed with the patient  Disposition Plan: Admit for further evaluation  Robbie Lis, MD  [facility] Pager [phone]  Review of Systems:  Constitutional: Negative for fever, chills and positive for malaise/fatigue. Negative for diaphoresis.  HENT: Negative for hearing loss, ear pain, nosebleeds, congestion, sore throat, neck pain, tinnitus and ear discharge.   Eyes: Negative for blurred vision, double vision, photophobia, pain, discharge and redness.  Respiratory: Negative for cough, hemoptysis, sputum production, shortness of breath, wheezing and stridor.   Cardiovascular: Negative for chest pain, palpitations, orthopnea, claudication and leg swelling.  Gastrointestinal: Negative for nausea, vomiting and abdominal pain. Negative for heartburn, constipation, blood in stool and melena.  Genitourinary: Negative for dysuria, urgency, frequency, hematuria and flank pain.  Musculoskeletal: Negative for myalgias, back pain, joint pain and falls.  Skin: Negative for itching and rash.  Neurological: Negative for dizziness and positive for weakness. Negative for tingling, tremors, sensory change, speech change, focal weakness, loss of consciousness and headaches.  Endo/Heme/Allergies: Negative for environmental allergies and polydipsia. Does not bruise/bleed easily.  Psychiatric/Behavioral: Negative for suicidal ideas. The patient is not nervous/anxious.      Past Medical History  Diagnosis Date  . Glaucoma     High pressure- ?pre glaucoma  . Cancer     ENDOMETRIAL CANCER   Past Surgical History  Procedure Laterality Date  . Cholecystectomy    . Appendectomy    . Abdominal hysterectomy N/A 03/01/2013    Procedure: HYSTERECTOMY ABDOMINAL TOTAL/BILATERAL SALPINGO  OOPHORECTOMY/WITH PELVIC AND PARA AORTIC LYMPHADNECTOMY ;  Surgeon: Alvino Chapel, MD;  Location: [facility];  Service:  Gynecology;  Laterality: N/A;  . Salpingoophorectomy Bilateral 03/01/2013    Procedure: BILATERAL SALPINGO OOPHORECTOMY;  Surgeon: Alvino Chapel, MD;  Location: [facility];  Service: Gynecology;  Laterality: Bilateral;   Social History:  reports that she has never smoked. She has never used smokeless tobacco. She reports that she does not drink alcohol or use illicit drugs.  Allergies  Allergen Reactions  . Bactrim [Sulfamethoxazole-Trimethoprim] Other (See Comments)    Made her see things  . Ciprofloxacin Other (See Comments)    Made her see things    Family History:  Family History  Problem Relation Age of Onset  . Breast cancer Maternal Aunt     x 4     Prior to Admission medications   Medication Sig Start Date End Date Taking? Authorizing Provider  Brinzolamide-Brimonidine [facility]) 1-0.2 % SUSP Place 1 drop into both eyes 2 (two) times daily.    Yes Historical Provider, MD  ferrous fumarate (HEMOCYTE - 106 MG FE) 325 (106 FE) MG TABS tablet Hemocyte DAW 1 daily -iron for low hemoglobin. 05/23/13  Yes Lennis Marion Downer, MD  LORazepam (ATIVAN) 0.5 MG tablet Take 1 tablet under the tongue or swallow every 6 hours as needed for nausea 03/14/13  Yes Lennis P Livesay, MD  ondansetron (ZOFRAN) 8 MG tablet Take 1-2 tablets (8-16 mg total) by mouth every 12 (twelve) hours as needed for nausea or vomiting. 03/14/13  Yes Lennis Marion Downer, MD  dexamethasone (DECADRON) 4 MG tablet Take 5 tabs = 20 mg with food 12 hours and 6 hours prior to Taxol chemotherapy. 04/28/13   Lennis Marion Downer, MD  potassium chloride (K-DUR) 10 MEQ tablet Take 2 tablets this afternoon, 2 this PM and then take 2 tablets twice a day 07/06/13   Gordy Levan, MD   Physical Exam: Filed Vitals:   07/13/13 1615  BP: 133/82  Pulse: 90  Temp: 98.2 F (36.8 C)  TempSrc: Oral  Resp: 18  SpO2: 99%    Physical Exam  Constitutional: Appears well-developed and well-nourished. No distress.  HENT: Normocephalic. No  tonsillar erythema or exudates Eyes: Conjunctivae and EOM are normal. PERRLA, no scleral icterus.  Neck: Normal ROM. Neck supple. No JVD. No tracheal deviation. No thyromegaly.  CVS: RRR, S1/S2 +, no murmurs, no gallops, no carotid bruit.  Pulmonary: Effort and breath sounds normal, no stridor, rhonchi, wheezes, rales.  Abdominal: Soft. BS +,  no distension, tenderness, rebound or guarding.  Musculoskeletal: Normal range of motion. No edema and no tenderness.  Lymphadenopathy: No lymphadenopathy noted, cervical, inguinal. Neuro: Alert. Normal reflexes, muscle tone coordination. No focal neurologic deficits. Skin: Skin is warm and dry. No rash noted. Not diaphoretic. No erythema. No pallor.  Psychiatric: Normal mood and affect. Behavior, judgment, thought content normal.   Labs on Admission:  Basic Metabolic Panel:  Recent Labs Lab 07/13/13 1415 07/13/13 1648  NA 141 141  K 2.0 Repeated and Verified* <2.2*  CL  --  101  CO2 16* 19  GLUCOSE 122 145*  BUN 16.6 18  CREATININE 1.7* 1.71*  CALCIUM 7.0* 6.8*   Liver Function Tests:  Recent Labs Lab 07/13/13 1648  AST 18  ALT 9  ALKPHOS 51  BILITOT 0.5  PROT 7.3  ALBUMIN 4.3   No results found for this basename: LIPASE, AMYLASE,  in the last 168 hours No results found for this  basename: AMMONIA,  in the last 168 hours CBC:  Recent Labs Lab 07/13/13 1648  WBC 4.3  NEUTROABS 3.3  HGB 10.3*  HCT 28.1*  MCV 92.1  PLT 305   Cardiac Enzymes: No results found for this basename: CKTOTAL, CKMB, CKMBINDEX, TROPONINI,  in the last 168 hours BNP: No components found with this basename: POCBNP,  CBG: No results found for this basename: GLUCAP,  in the last 168 hours  If 7PM-7AM, please contact night-coverage [email or web address] Password [facility] 07/13/2013, 6:15 PM

## 2013-07-13 NOTE — Progress Notes (Signed)
  Radiation Oncology         [phone] ________________________________  Name: Ebony Herring MRN: [id number]  Date: 07/13/2013  DOB: 1931/11/02  Simulation Verification Note  Status: outpatient  NARRATIVE: The patient was transferred to the high-dose-rate suite and placed on the treatment table. The patient's vaginal cylinder was reinserted. This was affixed to the CT/MR stabilization plate to prevent slippage. A fiducial marker was placed within the vaginal cylinder for imaging purposes. Patient then underwent AP and lateral film which documented accurate placement of the vaginal cylinder for treatment.  -----------------------------------  Blair Promise, PhD, MD

## 2013-07-13 NOTE — Progress Notes (Signed)
  Radiation Oncology         [phone] ________________________________  Name: Ebony Herring MRN: [id number]  Date: 07/13/2013  DOB: 10/09/1931  Vaginal brachytherapy procedure note  CC: [name], MD  Gordy Levan, MD  Diagnosis:   Endometrial cancer   Primary site: Corpus Uteri - Carcinoma   Staging method: AJCC 7th Edition   Clinical: Stage IIIC2 (T3a, N2, M0)   Pathologic: Stage IIIC2 (T3a, N2, M0)   Summary: Stage IIIC2 (T3a, N2, M0)     Narrative:  The patient returns today for planning and set up for her vaginal cuff radiation therapy. Patient continues to be fatigued from her external beam treatments. Her diarrhea is less of an issue at this time. She did see Dr. Loletta Specter- Dianah Field recently with a good report on pelvic examination.                         ALLERGIES:  is allergic to bactrim and ciprofloxacin.  Meds: Current Outpatient Prescriptions  Medication Sig Dispense Refill  . Brinzolamide-Brimonidine (SIMBRINZA) 1-0.2 % SUSP Place 1 drop into both eyes 2 (two) times daily.       . ferrous fumarate (HEMOCYTE - 106 MG FE) 325 (106 FE) MG TABS tablet Hemocyte DAW 1 daily -iron for low hemoglobin.  30 each  2  . LORazepam (ATIVAN) 0.5 MG tablet Take 1 tablet under the tongue or swallow every 6 hours as needed for nausea  20 tablet  0  . ondansetron (ZOFRAN) 8 MG tablet Take 1-2 tablets (8-16 mg total) by mouth every 12 (twelve) hours as needed for nausea or vomiting.  20 tablet  1  . potassium chloride (K-DUR) 10 MEQ tablet Take 2 tablets this afternoon, 2 this PM and then take 2 tablets twice a day  60 tablet  0  . dexamethasone (DECADRON) 4 MG tablet Take 5 tabs = 20 mg with food 12 hours and 6 hours prior to Taxol chemotherapy.  10 tablet  0   No current facility-administered medications for this encounter.    Physical Findings: The patient is in no acute distress. Patient is alert and oriented.  weight is 116 lb 4.8 oz (52.753 kg). Her temperature is  98.9 F (37.2 C). Her blood pressure is 99/71 and her pulse is 89. Her oxygen saturation is 100%. .    The patient was placed in the dorsolithotomy position. A pelvic exam was performed revealing no mucosal lesions within the vaginal vault. On bimanual examination there are no pelvic masses appreciated. The vaginal cuff was intact. The patient proceeded to undergo fitting for her vaginal cylinder. The optimal cylinder diameter was a 3 CM cylinder. Patient tolerated the procedure well. Later this morning the patient will be transferred to the CT simulation suite for planning for her high-dose-rate treatment.  Lab Findings: Lab Results  Component Value Date   WBC 2.9* 07/06/2013   HGB 9.9* 07/06/2013   HCT 28.6* 07/06/2013   MCV 98.3 07/06/2013   PLT 307 07/06/2013       ____________________________________ Blair Promise, MD

## 2013-07-13 NOTE — Telephone Encounter (Signed)
Pt notified that potassium is very low and she is more dehydrated. Instructed to come to the ED for IV fluids and potassium. Verbalized understanding. Stated she would come to [facility] ED

## 2013-07-13 NOTE — Telephone Encounter (Signed)
Medical Oncology  Repeat chemistries today in follow up of low potassium last week: potassium lower at 2.0 today, with creatinine up to 1.7 (baseline 0.9) She was having diarrhea presumed RT related last week, was told to push fluids and take imodium. Was to have started potassium 20 mEq bid also last week.  Tried home # x2 now, no answer and no VM. Will continue to try to reach her. She really should go to ED for IVF with potassium repletion now.   Godfrey Pick, MD

## 2013-07-14 DIAGNOSIS — C549 Malignant neoplasm of corpus uteri, unspecified: Secondary | ICD-10-CM

## 2013-07-14 LAB — CBC
HEMATOCRIT: 25.3 % — AB (ref 36.0–46.0)
Hemoglobin: 9.1 g/dL — ABNORMAL LOW (ref 12.0–15.0)
MCH: 33.7 pg (ref 26.0–34.0)
MCHC: 36 g/dL (ref 30.0–36.0)
MCV: 93.7 fL (ref 78.0–100.0)
Platelets: 255 10*3/uL (ref 150–400)
RBC: 2.7 MIL/uL — AB (ref 3.87–5.11)
RDW: 14.1 % (ref 11.5–15.5)
WBC: 4 10*3/uL (ref 4.0–10.5)

## 2013-07-14 LAB — COMPREHENSIVE METABOLIC PANEL
ALBUMIN: 3.3 g/dL — AB (ref 3.5–5.2)
ALT: 6 U/L (ref 0–35)
AST: 15 U/L (ref 0–37)
Alkaline Phosphatase: 44 U/L (ref 39–117)
BUN: 15 mg/dL (ref 6–23)
CALCIUM: 6.9 mg/dL — AB (ref 8.4–10.5)
CO2: 18 meq/L — AB (ref 19–32)
CREATININE: 1.49 mg/dL — AB (ref 0.50–1.10)
Chloride: 104 mEq/L (ref 96–112)
GFR calc Af Amer: 37 mL/min — ABNORMAL LOW (ref >90)
GFR, EST NON AFRICAN AMERICAN: 32 mL/min — AB (ref >90)
Glucose, Bld: 105 mg/dL — ABNORMAL HIGH (ref 70–99)
Potassium: 2.6 mEq/L — CL (ref 3.7–5.3)
SODIUM: 141 meq/L (ref 137–147)
TOTAL PROTEIN: 5.7 g/dL — AB (ref 6.0–8.3)
Total Bilirubin: 0.4 mg/dL (ref 0.3–1.2)

## 2013-07-14 LAB — URINE MICROSCOPIC-ADD ON

## 2013-07-14 LAB — URINALYSIS, ROUTINE W REFLEX MICROSCOPIC
Bilirubin Urine: NEGATIVE
Glucose, UA: NEGATIVE mg/dL
Ketones, ur: NEGATIVE mg/dL
Nitrite: NEGATIVE
Protein, ur: 30 mg/dL — AB
Specific Gravity, Urine: 1.013 (ref 1.005–1.030)
UROBILINOGEN UA: 0.2 mg/dL (ref 0.0–1.0)
pH: 5.5 (ref 5.0–8.0)

## 2013-07-14 LAB — MAGNESIUM
Magnesium: 2.8 mg/dL — ABNORMAL HIGH (ref 1.5–2.5)
Magnesium: 1.9 mg/dL (ref 1.5–2.5)

## 2013-07-14 LAB — BASIC METABOLIC PANEL
BUN: 13 mg/dL (ref 6–23)
CHLORIDE: 104 meq/L (ref 96–112)
CO2: 17 mEq/L — ABNORMAL LOW (ref 19–32)
Calcium: 6.6 mg/dL — ABNORMAL LOW (ref 8.4–10.5)
Creatinine, Ser: 1.32 mg/dL — ABNORMAL HIGH (ref 0.50–1.10)
GFR calc Af Amer: 43 mL/min — ABNORMAL LOW (ref >90)
GFR, EST NON AFRICAN AMERICAN: 37 mL/min — AB (ref >90)
GLUCOSE: 98 mg/dL (ref 70–99)
POTASSIUM: 2.8 meq/L — AB (ref 3.7–5.3)
Sodium: 138 mEq/L (ref 137–147)

## 2013-07-14 LAB — POTASSIUM
POTASSIUM: 2.4 meq/L — AB (ref 3.7–5.3)
Potassium: 2.4 mEq/L — CL (ref 3.7–5.3)
Potassium: 2.7 mEq/L — CL (ref 3.7–5.3)

## 2013-07-14 LAB — GLUCOSE, CAPILLARY: Glucose-Capillary: 90 mg/dL (ref 70–99)

## 2013-07-14 MED ORDER — POTASSIUM CHLORIDE 10 MEQ/100ML IV SOLN
10.0000 meq | Freq: Once | INTRAVENOUS | Status: AC
  Administered 2013-07-14: 10 meq via INTRAVENOUS
  Filled 2013-07-14: qty 100

## 2013-07-14 MED ORDER — POTASSIUM CHLORIDE 10 MEQ/100ML IV SOLN
10.0000 meq | INTRAVENOUS | Status: DC
  Administered 2013-07-14: 10 meq via INTRAVENOUS
  Filled 2013-07-14 (×5): qty 100

## 2013-07-14 MED ORDER — SODIUM CHLORIDE 0.9 % IV SOLN
INTRAVENOUS | Status: DC
  Administered 2013-07-14 – 2013-07-16 (×3): via INTRAVENOUS
  Filled 2013-07-14 (×6): qty 1000

## 2013-07-14 MED ORDER — MAGNESIUM OXIDE 400 (241.3 MG) MG PO TABS
400.0000 mg | ORAL_TABLET | Freq: Two times a day (BID) | ORAL | Status: DC
  Filled 2013-07-14 (×2): qty 1

## 2013-07-14 MED ORDER — POTASSIUM CHLORIDE 10 MEQ/100ML IV SOLN
10.0000 meq | INTRAVENOUS | Status: AC
  Administered 2013-07-14 (×4): 10 meq via INTRAVENOUS
  Filled 2013-07-14 (×4): qty 100

## 2013-07-14 MED ORDER — POTASSIUM CHLORIDE CRYS ER 20 MEQ PO TBCR
40.0000 meq | EXTENDED_RELEASE_TABLET | ORAL | Status: AC
  Filled 2013-07-14 (×2): qty 2

## 2013-07-14 NOTE — Progress Notes (Signed)
Patient ID: Ebony Herring  female  ZOX:[id number]    DOB: 07-21-1931    DOA: 07/13/2013  PCP: [name], MD  Assessment/Plan: Principal Problem:  Severe Hypokalemia and hypomagnesemia: Likely due to diarrhea from radiation colitis - Diarrhea has been improving - Magnesium replaced IV and is currently 2.8 - Recheck potassium still at 2.8, patient refuses oral potassium even in the liquid form.  - Will continue IV replacement until potassium is closer to 4 and that is completely resolved  Active Problems:   Endometrial cancer - Continue management per oncology and radiation oncology    Acute renal failure - Improving likely due to dehydration, diarrhea    Diarrhea - Improving, patient had no bowel movement this morning - Ordered GI pathogen panel and c diff   DVT Prophylaxis:  Code Status: Full code  Family Communication: Discussed in detail with the patient's husband, and rest of the family members at the bedside  Disposition: Hopefully DC home in a.m. if potassium is safely normal  Consultants:  None  Procedures:  None  Antibiotics:  None    Subjective: Patient seen and examined, upset about being called from home to the ER yesterday, refuses oral potassium. States diarrhea is improving  Objective: Weight change:   Intake/Output Summary (Last 24 hours) at 07/14/13 1157 Last data filed at 07/14/13 0900  Gross per 24 hour  Intake   1030 ml  Output      0 ml  Net   1030 ml   Blood pressure 127/64, pulse 81, temperature 98.5 F (36.9 C), temperature source Oral, resp. rate 20, height 5\' 3"  (1.6 m), weight 52.753 kg (116 lb 4.8 oz), SpO2 100.00%.  Physical Exam: General: Alert and awake, oriented x3, not in any acute distress. CVS: S1-S2 clear, no murmur rubs or gallops Chest: clear to auscultation bilaterally, no wheezing, rales or rhonchi Abdomen: soft nontender, nondistended, normal bowel sounds  Extremities: no cyanosis, clubbing or edema noted  bilaterally Neuro: Cranial nerves II-XII intact, no focal neurological deficits  Lab Results: Basic Metabolic Panel:  Recent Labs Lab 07/13/13 2000 07/14/13 0331 07/14/13 0850  NA 141 141 138  K <2.2* 2.6* 2.8*  CL 104 104 104  CO2 19 18* 17*  GLUCOSE 113* 105* 98  BUN 17 15 13   CREATININE 1.48* 1.49* 1.32*  CALCIUM 6.3* 6.9* 6.6*  MG 0.8* 2.8*  --   PHOS 1.8*  --   --    Liver Function Tests:  Recent Labs Lab 07/13/13 1648 07/14/13 0331  AST 18 15  ALT 9 6  ALKPHOS 51 44  BILITOT 0.5 0.4  PROT 7.3 5.7*  ALBUMIN 4.3 3.3*   No results found for this basename: LIPASE, AMYLASE,  in the last 168 hours No results found for this basename: AMMONIA,  in the last 168 hours CBC:  Recent Labs Lab 07/13/13 2000 07/14/13 0331  WBC 5.2 4.0  NEUTROABS 4.0  --   HGB 9.4* 9.1*  HCT 25.8* 25.3*  MCV 93.1 93.7  PLT 262 255   Cardiac Enzymes: No results found for this basename: CKTOTAL, CKMB, CKMBINDEX, TROPONINI,  in the last 168 hours BNP: No components found with this basename: POCBNP,  CBG:  Recent Labs Lab 07/14/13 0733  GLUCAP 90     Micro Results: No results found for this or any previous visit (from the past 240 hour(s)).  Studies/Results: No results found.  Medications: Scheduled Meds: . calcium-vitamin D  1 tablet Oral Q breakfast  .  enoxaparin (LOVENOX) injection  30 mg Subcutaneous Q24H  . potassium chloride  10 mEq Intravenous Q1 Hr x 4  . potassium chloride  10 mEq Intravenous Once  . potassium chloride  40 mEq Oral Q4H  . sodium chloride  3 mL Intravenous Q12H      LOS: 1 day   Vastie Douty Krystal Eaton M.D. [facility] 07/14/2013, 11:57 AM Pager: [phone]  If 7PM-7AM, please contact night-coverage [email or web address] Password [email or web address]  **Disclaimer: This note was dictated with voice recognition software. Similar sounding words can inadvertently be transcribed and this note may contain transcription errors which may not have been corrected upon  publication of note.**

## 2013-07-14 NOTE — Progress Notes (Signed)
CRITICAL VALUE ALERT  Critical value received:  Potassium 2.6  Date of notification:  07/14/2013  Time of notification:  0422  Critical value read back:yes  Nurse who received alert:  L. Vergel de Larkin Ina RN  MD notified (1st page):  Lamar Blinks NP  Time of first page:  0424  MD notified (2nd page):  Time of second page:  Responding MD:  Lamar Blinks NP  Time MD responded:  [phone]

## 2013-07-14 NOTE — Progress Notes (Signed)
INITIAL NUTRITION ASSESSMENT  DOCUMENTATION CODES Per approved criteria  -Severe malnutrition in the context of chronic illness  Pt meets criteria for severe MALNUTRITION in the context of chronic illness as evidenced by 14.7% body weight in <6 months, PO intake <75% for > one month.   INTERVENTION: -Reviewed nutrition therapy for radiation-related nutrition side effects -Encouraged intake of small frequent high kcal/protein foods -Will continue to monitor  NUTRITION DIAGNOSIS: Unintentional wt loss related to inadequate energy intake as evidenced by 20 lbs weight loss in 5 months (14.7% body weight).   Goal: Pt to meet >/= 90% of their estimated nutrition needs    Monitor:  Total protein/energy intake, labs, weights  Reason for Assessment: MST  80 y.o. female  Admitting Dx: Hypokalemia  ASSESSMENT: 80 year old female with past medical history of endometrial carcinoma, has completed 3 cycles of carboplatin and Taxol and is receiving extended field radiation therapy, will resume chemotherapy with 3 additional cycles of taxane + carboplatin after radiation. Last IMRT 07-07-13, then HDR thru 07-27-13, under Dr. Clabe Seal care, has been seen in Dr. Mariana Kaufman office and was found to have critically low potassium of less than 2 and has had intractable diarrhea. Pt reported feeling fatigues and weak.  -Pt endorsed an unintentional wt loss of 20 lbs since 02/2013. Denied any changes in appetite during chemotherapy treatments; however noted significant decreased intake once she began radiation treatments -Confirmed loss of taste for favorite foods, nausea and overall decreased appetite. -Reviewed nutrition therapy for taste changes-citrus flavor, vinegar flavors, mouth rinses and strong herbs and spices -Pt denied use of supplements as she is concern with additives. Discussed use of organic supplements (Orgain, Enu, organic muscle milk) however, pt continued to be adamant against use of any  supplements -Discussed addition of high protein/kcal snacks for pt to incorporate-cheese/yogurt/pudding/ice cream/peanut butter -Pt refused breakfast, however PO intake >75% at lunch -MD noted pt with low Mg and K d/t radiation colitis  Height: Ht Readings from Last 1 Encounters:  07/13/13 5\' 3"  (1.6 m)    Weight: Wt Readings from Last 1 Encounters:  07/13/13 116 lb 4.8 oz (52.753 kg)    Ideal Body Weight: 115 lbs  % Ideal Body Weight: 101%  Wt Readings from Last 10 Encounters:  07/13/13 116 lb 4.8 oz (52.753 kg)  07/13/13 116 lb 4.8 oz (52.753 kg)  07/06/13 119 lb (53.978 kg)  07/05/13 120 lb 3.2 oz (54.522 kg)  06/29/13 120 lb 6.4 oz (54.613 kg)  06/28/13 122 lb 8 oz (55.566 kg)  06/21/13 124 lb 9.6 oz (56.518 kg)  06/14/13 127 lb (57.607 kg)  06/07/13 127 lb 4.8 oz (57.743 kg)  05/23/13 128 lb 12.8 oz (58.423 kg)    Usual Body Weight: 136 kbs  % Usual Body Weight: 85%  BMI:  Body mass index is 20.61 kg/(m^2).  Estimated Nutritional Needs: Kcal: 1600-1800 Protein: 65-80 gram Fluid: >/=1600 ml/daily  Skin: WDL  Diet Order: General  EDUCATION NEEDS: -Education needs addressed   Intake/Output Summary (Last 24 hours) at 07/14/13 1425 Last data filed at 07/14/13 0900  Gross per 24 hour  Intake   1030 ml  Output      0 ml  Net   1030 ml    Last BM: 6/03   Labs:   Recent Labs Lab 07/13/13 1648 07/13/13 2000 07/14/13 0331 07/14/13 0850 07/14/13 1323  NA 141 141 141 138  --   K <2.2* <2.2* 2.6* 2.8* 2.4*  CL 101 104 104 104  --  CO2 19 19 18* 17*  --   BUN 18 17 15 13   --   CREATININE 1.71* 1.48* 1.49* 1.32*  --   CALCIUM 6.8* 6.3* 6.9* 6.6*  --   MG  --  0.8* 2.8*  --   --   PHOS  --  1.8*  --   --   --   GLUCOSE 145* 113* 105* 98  --     CBG (last 3)   Recent Labs  07/14/13 0733  GLUCAP 90    Scheduled Meds: . calcium-vitamin D  1 tablet Oral Q breakfast  . enoxaparin (LOVENOX) injection  30 mg Subcutaneous Q24H  . potassium  chloride  40 mEq Oral Q4H  . sodium chloride  3 mL Intravenous Q12H    Continuous Infusions: . 0.9 % NaCl with KCl 20 mEq / L 100 mL/hr at 07/14/13 1248    Past Medical History  Diagnosis Date  . Glaucoma     High pressure- ?pre glaucoma  . Cancer     ENDOMETRIAL CANCER    Past Surgical History  Procedure Laterality Date  . Cholecystectomy    . Appendectomy    . Abdominal hysterectomy N/A 03/01/2013    Procedure: HYSTERECTOMY ABDOMINAL TOTAL/BILATERAL SALPINGO OOPHORECTOMY/WITH PELVIC AND PARA AORTIC LYMPHADNECTOMY ;  Surgeon: Alvino Chapel, MD;  Location: [facility];  Service: Gynecology;  Laterality: N/A;  . Salpingoophorectomy Bilateral 03/01/2013    Procedure: BILATERAL SALPINGO OOPHORECTOMY;  Surgeon: Alvino Chapel, MD;  Location: [facility];  Service: Gynecology;  Laterality: Bilateral;    [place] LDN Clinical Dietitian [phone]

## 2013-07-14 NOTE — Progress Notes (Signed)
Md aware of critical potassium level 2.4  Jamarii Banks B RN

## 2013-07-14 NOTE — Care Management Note (Signed)
    Page 1 of 1   07/14/2013     2:42:09 PM CARE MANAGEMENT NOTE 07/14/2013  Patient:  Ebony Herring, Ebony Herring   Account Number:  [id number]  Date Initiated:  07/14/2013  Documentation initiated by:  [facility]  Subjective/Objective Assessment:   [address] [place].     Action/Plan:   FROM HOME.   Anticipated DC Date:  07/16/2013   Anticipated DC Plan:  [place]  CM consult      Choice offered to / List presented to:             Status of service:  In process, will continue to follow Medicare Important Message given?   (If response is "NO", the following Medicare IM given date fields will be blank) Date Medicare IM given:   Date Additional Medicare IM given:    Discharge Disposition:    Per UR Regulation:  Reviewed for med. necessity/level of care/duration of stay  If discussed at Long Length of Stay Meetings, dates discussed:    Comments:  07/14/13 Trevar Boehringer RN,BSN NCM [phone] NO ANTICIPATED D/C NEEDS.

## 2013-07-14 NOTE — ED Provider Notes (Signed)
Medical screening examination/treatment/procedure(s) were conducted as a shared visit with non-physician practitioner(s) and myself.  I personally evaluated the patient during the encounter.   EKG Interpretation None      Pt sent here by PCP due to hypokalemia.  Pt denies any sx but states poor po's, diarrhea and not taking K as ppx due to intolerance.  K here is <2 and ST depression diffusely.  Started to replace K and pt admitted  Blanchie Dessert, MD 07/14/13 [phone]

## 2013-07-14 NOTE — Plan of Care (Signed)
CRITICAL VALUE ALERT  Critical value received:  k  Date of notification:  07/14/2013  Time of notification:  8127  Critical value read back:yes Nurse who received alert:  Tay Whitwell RN  MD notified (1st page):  1020  Time of first page:  46  MD notified (2nd page):  Time of second page:  Responding MD:  Dr Tana Coast  Time MD responded:  [phone]

## 2013-07-15 DIAGNOSIS — E43 Unspecified severe protein-calorie malnutrition: Secondary | ICD-10-CM | POA: Diagnosis present

## 2013-07-15 LAB — BASIC METABOLIC PANEL
BUN: 10 mg/dL (ref 6–23)
CALCIUM: 6.7 mg/dL — AB (ref 8.4–10.5)
CO2: 17 mEq/L — ABNORMAL LOW (ref 19–32)
CREATININE: 1.19 mg/dL — AB (ref 0.50–1.10)
Chloride: 111 mEq/L (ref 96–112)
GFR calc Af Amer: 49 mL/min — ABNORMAL LOW (ref >90)
GFR calc non Af Amer: 42 mL/min — ABNORMAL LOW (ref >90)
Glucose, Bld: 96 mg/dL (ref 70–99)
Potassium: 2.9 mEq/L — CL (ref 3.7–5.3)
Sodium: 143 mEq/L (ref 137–147)

## 2013-07-15 LAB — MAGNESIUM: MAGNESIUM: 1.6 mg/dL (ref 1.5–2.5)

## 2013-07-15 LAB — POTASSIUM: Potassium: 3.1 mEq/L — ABNORMAL LOW (ref 3.7–5.3)

## 2013-07-15 LAB — GLUCOSE, CAPILLARY: Glucose-Capillary: 89 mg/dL (ref 70–99)

## 2013-07-15 MED ORDER — GI COCKTAIL ~~LOC~~
30.0000 mL | Freq: Once | ORAL | Status: AC
  Administered 2013-07-15: 30 mL via ORAL
  Filled 2013-07-15: qty 30

## 2013-07-15 MED ORDER — POTASSIUM CHLORIDE 10 MEQ/100ML IV SOLN
10.0000 meq | INTRAVENOUS | Status: AC
  Administered 2013-07-15 (×3): 10 meq via INTRAVENOUS
  Filled 2013-07-15 (×4): qty 100

## 2013-07-15 MED ORDER — DEXTROSE 5 % IV SOLN
1.0000 g | Freq: Every day | INTRAVENOUS | Status: DC
  Administered 2013-07-15: 1 g via INTRAVENOUS
  Filled 2013-07-15 (×2): qty 10

## 2013-07-15 MED ORDER — POTASSIUM PHOSPHATES 15 MMOLE/5ML IV SOLN
30.0000 mmol | Freq: Once | INTRAVENOUS | Status: AC
  Administered 2013-07-15: 30 mmol via INTRAVENOUS
  Filled 2013-07-15: qty 10

## 2013-07-15 MED ORDER — POTASSIUM CHLORIDE CRYS ER 20 MEQ PO TBCR
40.0000 meq | EXTENDED_RELEASE_TABLET | ORAL | Status: AC
  Administered 2013-07-15 (×2): 40 meq via ORAL
  Filled 2013-07-15 (×3): qty 2

## 2013-07-15 MED ORDER — POTASSIUM CHLORIDE 10 MEQ/100ML IV SOLN
10.0000 meq | INTRAVENOUS | Status: DC
  Administered 2013-07-15: 10 meq via INTRAVENOUS
  Filled 2013-07-15 (×4): qty 100

## 2013-07-15 MED ORDER — POTASSIUM CHLORIDE CRYS ER 20 MEQ PO TBCR
40.0000 meq | EXTENDED_RELEASE_TABLET | Freq: Once | ORAL | Status: DC
  Filled 2013-07-15: qty 2

## 2013-07-15 MED ORDER — MAGNESIUM OXIDE 400 (241.3 MG) MG PO TABS
400.0000 mg | ORAL_TABLET | Freq: Two times a day (BID) | ORAL | Status: DC
  Administered 2013-07-15 (×2): 400 mg via ORAL
  Filled 2013-07-15 (×4): qty 1

## 2013-07-15 MED ORDER — MAGNESIUM SULFATE 50 % IJ SOLN
3.0000 g | Freq: Once | INTRAVENOUS | Status: AC
  Administered 2013-07-15: 3 g via INTRAVENOUS
  Filled 2013-07-15: qty 6

## 2013-07-15 NOTE — Progress Notes (Signed)
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 Patient continued to refuse oral potassium replacement yesterday, overnight her IV leaked and unfortunately may not have received potassium (per patient, her bed was soaked due to leaking IV) - Magnesium replaced IV today - Will continue IV and oral replacement until potassium is closer to 4. I discussed in detail with the patient that it is absolutely important that she takes oral potassium pills, we can give her GI cocktail, patient's sister at the bedside also helping to calm the patient  Active Problems:   Endometrial cancer - Continue management per oncology and radiation oncology    Acute renal failure - Acute renal insufficiency has resolved    Diarrhea -Has completely resolved and stool studies canceled  Severe protein calorie malnutrition in the context of chronic illness as evidenced by 14.7% body weight in <6 months, PO intake <75% for > one month". -Nutrition consult placed  UTI - Urine culture showed 75,000 colonies of gram-negative rods, will follow urine culture and sensitivities, placed on IV Rocephin  DVT Prophylaxis:  Code Status: Full code  Family Communication: Discussed in detail with the patient's husband, and rest of the family members at the bedside  Disposition: Hopefully DC home in a.m. if potassium is safely normal  Consultants:  None  Procedures:  None  Antibiotics:  None    Subjective: Patient seen and examined, upset about leaking IV overnight, continues to refuse oral potassium replacement  Objective: Weight change: 2.903 kg (6 lb 6.4 oz)  Intake/Output Summary (Last 24 hours) at 07/15/13 1215 Last data filed at 07/15/13 0535  Gross per  24 hour  Intake   1100 ml  Output      0 ml  Net   1100 ml   Blood pressure 116/58, pulse 70, temperature 99.3 F (37.4 C), temperature source Oral, resp. rate 18, height 5\' 3"  (1.6 m), weight 55.656 kg (122 lb 11.2 oz), SpO2 100.00%.  Physical Exam: General: Alert and awake, oriented x3, not in any acute distress. CVS: S1-S2 clear, no murmur rubs or gallops Chest: clear to auscultation bilaterally, no wheezing, rales or rhonchi Abdomen: soft nontender, nondistended, normal bowel sounds  Extremities: no cyanosis, clubbing or edema noted bilaterally   Lab Results: Basic Metabolic Panel:  Recent Labs Lab 07/13/13 2000  07/14/13 0850  07/14/13 1854 07/15/13 0345  NA 141  < > 138  --   --  143  K <2.2*  < > 2.8*  < > 2.7* 2.9*  CL 104  < > 104  --   --  111  CO2 19  < > 17*  --   --  17*  GLUCOSE 113*  < > 98  --   --  96  BUN 17  < > 13  --   --  10  CREATININE 1.48*  < > 1.32*  --   --  1.19*  CALCIUM 6.3*  < > 6.6*  --   --  6.7*  MG 0.8*  < >  --   < >  --  1.6  PHOS 1.8*  --   --   --   --   --   < > = values in this interval not displayed. Liver Function  Tests:  Recent Labs Lab 07/13/13 1648 07/14/13 0331  AST 18 15  ALT 9 6  ALKPHOS 51 44  BILITOT 0.5 0.4  PROT 7.3 5.7*  ALBUMIN 4.3 3.3*   No results found for this basename: LIPASE, AMYLASE,  in the last 168 hours No results found for this basename: AMMONIA,  in the last 168 hours CBC:  Recent Labs Lab 07/13/13 2000 07/14/13 0331  WBC 5.2 4.0  NEUTROABS 4.0  --   HGB 9.4* 9.1*  HCT 25.8* 25.3*  MCV 93.1 93.7  PLT 262 255   Cardiac Enzymes: No results found for this basename: CKTOTAL, CKMB, CKMBINDEX, TROPONINI,  in the last 168 hours BNP: No components found with this basename: POCBNP,  CBG:  Recent Labs Lab 07/14/13 0733 07/15/13 0746  GLUCAP 90 89     Micro Results: Recent Results (from the past 240 hour(s))  URINE CULTURE     Status: None   Collection Time    07/14/13  5:15 AM       Result Value Ref Range Status   Specimen Description URINE, RANDOM   Final   Special Requests NONE   Final   Culture  Setup Time     Final   Value: 07/14/2013 08:56     Performed at [facility]     Final   Value: 75,000 COLONIES/ML     Performed at [facility]   Culture     Final   Value: [place]     Performed at [facility]   Report Status PENDING   Incomplete    Studies/Results: No results found.  Medications: Scheduled Meds: . calcium-vitamin D  1 tablet Oral Q breakfast  . cefTRIAXone (ROCEPHIN)  IV  1 g Intravenous Daily  . enoxaparin (LOVENOX) injection  30 mg Subcutaneous Q24H  . gi cocktail  30 mL Oral Once  . magnesium oxide  400 mg Oral BID  . magnesium sulfate LVP 250-500 ml  3 g Intravenous Once  . potassium chloride  40 mEq Oral Q4H  . sodium chloride  3 mL Intravenous Q12H      LOS: 2 days   Ripudeep Krystal Eaton M.D. [facility] 07/15/2013, 12:15 PM Pager: [phone]  If 7PM-7AM, please contact night-coverage [email or web address] Password [email or web address]  **Disclaimer: This note was dictated with voice recognition software. Similar sounding words can inadvertently be transcribed and this note may contain transcription errors which may not have been corrected upon publication of note.**

## 2013-07-15 NOTE — Clinical Documentation Improvement (Signed)
  Eval by Registered Dietician completed 6/4 stating: "meets criteria for Severe protein calorie malnutrition in the context of chronic illness as evidenced by 14.7% body weight in <6 months, PO intake <75% for > one month".Please address in Notes to reflect severity of illness and risk of mortality. Thank you.   Possible Clinical Conditions? - Severe Protein Calorie Malnutrition - Severe Malnutrition - Other Condition  Supporting Information: - % usual body weight: 85% - BMI 20.6 - unintentional wt loss of 20 lbs since 02/2013  Thank You, Ezekiel Ina ,RN Clinical Documentation Specialist:  [facility]

## 2013-07-15 NOTE — Progress Notes (Signed)
NUTRITION FOLLOW UP/CONSULT FOR POOR PO INTAKE  Intervention:   - Nursing to continue to encouraged increased meal intake - Recommend MD replace pt's low phosphorus, texted paged MD - RD to continue to monitor   Nutrition Dx:   Unintentional wt loss related to inadequate energy intake as evidenced by 20 lbs weight loss in 5 months (14.7% body weight) - ongoing   Goal:   Pt to meet >/= 90% of their estimated nutrition needs - likely met based on reported PO intake of 100% meal intake yesterday and today  Monitor:   Weights, labs, intake   Assessment:   80 year old female with past medical history of endometrial carcinoma, has completed 3 cycles of carboplatin and Taxol and is receiving extended field radiation therapy, will resume chemotherapy with 3 additional cycles of taxane + carboplatin after radiation. Last IMRT 07-07-13, then HDR thru 07-27-13, under Dr. Clabe Seal care, has been seen in Dr. Mariana Kaufman office and was found to have critically low potassium of less than 2 and has had intractable diarrhea. Pt reported feeling fatigues and weak.   6/4: -Pt endorsed an unintentional wt loss of 20 lbs since 02/2013. Denied any changes in appetite during chemotherapy treatments; however noted significant decreased intake once she began radiation treatments  -Confirmed loss of taste for favorite foods, nausea and overall decreased appetite.  -Reviewed nutrition therapy for taste changes-citrus flavor, vinegar flavors, mouth rinses and strong herbs and spices  -Pt denied use of supplements as she is concern with additives. Discussed use of organic supplements (Orgain, Enu, organic muscle milk) however, pt continued to be adamant against use of any supplements  -Discussed addition of high protein/kcal snacks for pt to incorporate-cheese/yogurt/pudding/ice cream/peanut butter  -Pt refused breakfast, however PO intake >75% at lunch  -MD noted pt with low Mg and K d/t radiation colitis  6/5: -Pt  asleep and alone in room, did not disturb -Per RN, pt ate most of her lunch, unsure of breakfast intake -RN states diarrhea has resolved -RN reports pt took oral dose of KCl this morning   Potassium low, getting replaced Phosphorus low, no replacement ordered Magnesium WNL   Potassium  Date/Time Value Ref Range Status  07/15/2013  3:45 AM 2.9* 3.7 - 5.3 mEq/L Final     CRITICAL RESULT CALLED TO, READ BACK BY AND VERIFIED WITH:     JSCOTTEN RN AT 0435 ON [id number] BY DLONG  07/14/2013  6:54 PM 2.7* 3.7 - 5.3 mEq/L Final     REPEATED TO VERIFY     NO VISIBLE HEMOLYSIS     CRITICAL RESULT CALLED TO, READ BACK BY AND VERIFIED WITH:     NICHOLS T RN 2017 07/14/2013 BY BOVELL,T.  07/14/2013  3:45 PM 2.4* 3.7 - 5.3 mEq/L Final     CRITICAL RESULT CALLED TO, READ BACK BY AND VERIFIED WITH:     BORUN,MYRNA/4W @ 1656 ON [id number] BY POTEAT,S  07/13/2013  2:15 PM 2.0 Repeated and Verified* 3.5 - 5.1 mEq/L Final  07/06/2013 12:29 PM 2.2* 3.5 - 5.1 mEq/L Final  05/23/2013  1:47 PM 4.1  3.5 - 5.1 mEq/L Final    Phosphorus  Date/Time Value Ref Range Status  07/13/2013  8:00 PM 1.8* 2.3 - 4.6 mg/dL Final    Magnesium  Date/Time Value Ref Range Status  07/15/2013  3:45 AM 1.6  1.5 - 2.5 mg/dL Final  07/14/2013  3:09 PM 1.9  1.5 - 2.5 mg/dL Final  07/14/2013  3:31 AM 2.8* 1.5 -  2.5 mg/dL Final     Height: Ht Readings from Last 1 Encounters:  07/13/13 $RemoveB'5\' 3"'[id number]$  (1.6 m)    Weight Status:   Wt Readings from Last 1 Encounters:  07/15/13 122 lb 11.2 oz (55.656 kg)    Re-estimated needs:  Kcal: 1600-1800  Protein: 65-80 gram  Fluid: >/=1600 ml/daily   Skin: WDL   Diet Order: General   Intake/Output Summary (Last 24 hours) at 07/15/13 1418 Last data filed at 07/15/13 0535  Gross per 24 hour  Intake    860 ml  Output      0 ml  Net    860 ml    Last BM: 6/4   Labs:   Recent Labs Lab 07/13/13 1648  07/13/13 2000 07/14/13 0331 07/14/13 0850  07/14/13 1509 07/14/13 1545 07/14/13 1854  07/15/13 0345  NA 141  --  141 141 138  --   --   --   --  143  K <2.2*  --  <2.2* 2.6* 2.8*  < >  --  2.4* 2.7* 2.9*  CL 101  --  104 104 104  --   --   --   --  111  CO2 19  --  19 18* 17*  --   --   --   --  17*  BUN 18  --  $R'17 15 13  'hZ$ --   --   --   --  10  CREATININE 1.71*  --  1.48* 1.49* 1.32*  --   --   --   --  1.19*  CALCIUM 6.8*  --  6.3* 6.9* 6.6*  --   --   --   --  6.7*  MG  --   < > 0.8* 2.8*  --   --  1.9  --   --  1.6  PHOS  --   --  1.8*  --   --   --   --   --   --   --   GLUCOSE 145*  --  113* 105* 98  --   --   --   --  96  < > = values in this interval not displayed.  CBG (last 3)   Recent Labs  07/14/13 0733 07/15/13 0746  GLUCAP 90 89    Scheduled Meds: . calcium-vitamin D  1 tablet Oral Q breakfast  . cefTRIAXone (ROCEPHIN)  IV  1 g Intravenous Daily  . enoxaparin (LOVENOX) injection  30 mg Subcutaneous Q24H  . gi cocktail  30 mL Oral Once  . magnesium oxide  400 mg Oral BID  . potassium chloride  10 mEq Intravenous Q1 Hr x 4  . potassium chloride  40 mEq Oral Q4H  . sodium chloride  3 mL Intravenous Q12H    Continuous Infusions: . sodium chloride 0.9 % 1,000 mL with potassium chloride 40 mEq infusion 100 mL/hr at 07/14/13 1844    Carlis Stable MS, RD, LDN [phone] Pager [phone] Weekend/After Hours Pager

## 2013-07-15 NOTE — Progress Notes (Signed)
CRITICAL VALUE ALERT  Critical value received: Potassium 2.9  Date of notification:  07/15/13  Time of notification:  3736  Critical value read back:yes  Nurse who received alert:  Dellie Catholic  MD notified (1st page):  Yes  Time of first page:  [phone]

## 2013-07-16 LAB — GLUCOSE, CAPILLARY: Glucose-Capillary: 89 mg/dL (ref 70–99)

## 2013-07-16 LAB — URINE CULTURE

## 2013-07-16 LAB — BASIC METABOLIC PANEL WITH GFR
BUN: 5 mg/dL — ABNORMAL LOW (ref 6–23)
CO2: 17 meq/L — ABNORMAL LOW (ref 19–32)
Calcium: 6.9 mg/dL — ABNORMAL LOW (ref 8.4–10.5)
Chloride: 112 meq/L (ref 96–112)
Creatinine, Ser: 0.92 mg/dL (ref 0.50–1.10)
GFR calc Af Amer: 67 mL/min — ABNORMAL LOW
GFR calc non Af Amer: 58 mL/min — ABNORMAL LOW
Glucose, Bld: 97 mg/dL (ref 70–99)
Potassium: 3.7 meq/L (ref 3.7–5.3)
Sodium: 144 meq/L (ref 137–147)

## 2013-07-16 LAB — MAGNESIUM: Magnesium: 1.7 mg/dL (ref 1.5–2.5)

## 2013-07-16 MED ORDER — SACCHAROMYCES BOULARDII 250 MG PO CAPS: 250.0000 mg | ORAL_CAPSULE | Freq: Two times a day (BID) | ORAL | Status: DC

## 2013-07-16 MED ORDER — CEPHALEXIN 500 MG PO CAPS: 500.0000 mg | ORAL_CAPSULE | Freq: Two times a day (BID) | ORAL | Status: DC

## 2013-07-16 MED ORDER — GI COCKTAIL ~~LOC~~
30.0000 mL | Freq: Once | ORAL | Status: DC
  Filled 2013-07-16: qty 30

## 2013-07-16 MED ORDER — POTASSIUM CHLORIDE ER 10 MEQ PO TBCR: 10.0000 meq | EXTENDED_RELEASE_TABLET | Freq: Every day | ORAL | Status: DC

## 2013-07-16 MED ORDER — POTASSIUM CHLORIDE CRYS ER 20 MEQ PO TBCR
40.0000 meq | EXTENDED_RELEASE_TABLET | Freq: Once | ORAL | Status: AC
  Administered 2013-07-16: 40 meq via ORAL
  Filled 2013-07-16: qty 2

## 2013-07-16 MED ORDER — ENOXAPARIN SODIUM 40 MG/0.4ML ~~LOC~~ SOLN: 40.0000 mg | SUBCUTANEOUS | Status: DC

## 2013-07-16 MED ORDER — LOPERAMIDE HCL 2 MG PO CAPS: 2.0000 mg | ORAL_CAPSULE | ORAL | Status: DC | PRN

## 2013-07-16 MED ORDER — SACCHAROMYCES BOULARDII 250 MG PO CAPS
250.0000 mg | ORAL_CAPSULE | Freq: Two times a day (BID) | ORAL | Status: DC
  Administered 2013-07-16: 250 mg via ORAL
  Filled 2013-07-16 (×2): qty 1

## 2013-07-16 MED ORDER — LOPERAMIDE HCL 2 MG PO CAPS
2.0000 mg | ORAL_CAPSULE | Freq: Once | ORAL | Status: AC
  Administered 2013-07-16: 2 mg via ORAL
  Filled 2013-07-16: qty 1

## 2013-07-16 MED ORDER — MAGNESIUM OXIDE 400 (241.3 MG) MG PO TABS
400.0000 mg | ORAL_TABLET | Freq: Every day | ORAL | Status: DC
  Administered 2013-07-16: 400 mg via ORAL
  Filled 2013-07-16: qty 1

## 2013-07-16 MED ORDER — MAGNESIUM OXIDE 400 (241.3 MG) MG PO TABS: 400.0000 mg | ORAL_TABLET | Freq: Every day | ORAL | Status: DC

## 2013-07-16 MED ORDER — PANTOPRAZOLE SODIUM 40 MG PO TBEC: 40.0000 mg | DELAYED_RELEASE_TABLET | Freq: Every day | ORAL | Status: DC

## 2013-07-16 NOTE — Discharge Summary (Signed)
Physician Discharge Summary  Patient ID: Ebony Herring MRN: [id number] DOB/AGE: [birth year] 78 y.o.  Admit date: 07/13/2013 Discharge date: 07/16/2013  Primary Care Physician:  [name], MD  Discharge Diagnoses:     . severe Hypokalemia   Severe hypomagnesemia  . Endometrial cancer . Acute renal failure resolved  . Diarrhea resolved  . Protein-calorie malnutrition, severe UTI  Consults: None   Recommendations for Outpatient Follow-up:  Please check labs for BMET, magnesium at the followup appointment  Allergies:   Allergies  Allergen Reactions  . Bactrim [Sulfamethoxazole-Trimethoprim] Other (See Comments)    Made her see things  . Ciprofloxacin Other (See Comments)    Made her see things     Discharge Medications:   Medication List         cephALEXin 500 MG capsule  Commonly known as:  KEFLEX  Take 1 capsule (500 mg total) by mouth 2 (two) times daily. X 2days     dexamethasone 4 MG tablet  Commonly known as:  DECADRON  Take 5 tabs = 20 mg with food 12 hours and 6 hours prior to Taxol chemotherapy.     ferrous fumarate 325 (106 FE) MG Tabs tablet  Commonly known as:  HEMOCYTE - 106 mg FE  Hemocyte DAW 1 daily -iron for low hemoglobin.     loperamide 2 MG capsule  Commonly known as:  IMODIUM  Take 1 capsule (2 mg total) by mouth as needed for diarrhea or loose stools.     LORazepam 0.5 MG tablet  Commonly known as:  ATIVAN  Take 1 tablet under the tongue or swallow every 6 hours as needed for nausea     magnesium oxide 400 (241.3 MG) MG tablet  Commonly known as:  MAG-OX  Take 1 tablet (400 mg total) by mouth daily.     ondansetron 8 MG tablet  Commonly known as:  ZOFRAN  Take 1-2 tablets (8-16 mg total) by mouth every 12 (twelve) hours as needed for nausea or vomiting.     pantoprazole 40 MG tablet  Commonly known as:  PROTONIX  Take 1 tablet (40 mg total) by mouth daily.     potassium chloride 10 MEQ tablet  Commonly known as:  K-DUR   Take 1 tablet (10 mEq total) by mouth daily.     saccharomyces boulardii 250 MG capsule  Commonly known as:  FLORASTOR  Take 1 capsule (250 mg total) by mouth 2 (two) times daily.     SIMBRINZA 1-0.2 % Susp  Generic drug:  Brinzolamide-Brimonidine  Place 1 drop into both eyes 2 (two) times daily.         Brief H and P: For complete details please refer to admission H and P, but in brief 80 year old female with past medical history of endometrial carcinoma, has completed 3 cycles of carboplatin and Taxol and is receiving extended field radiation therapy, will resume chemotherapy with 3 additional cycles of taxane + carboplatin after radiation. Last IMRT 07-07-13, then HDR thru 07-27-13, under Dr. Clabe Seal care, was seen in Dr. Mariana Kaufman office and was found to have critically low potassium of less than 2 and has had intractable diarrhea. Pt reported feeling fatigues and weak, no fever or chills. No abdominal pain, nausea or vomiting. No chest pain, no palpitations or shortness of breath.  In Ed, pt was found to have potassium of less than 2.2. She received 40 meq potassium chloride by mouth and 3 doses of 10 meq potassium through IV. Patient  was admitted for further management.   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 The patient's potassium was critically low less than 2.2 at the time of admission. Currently at the time of discharge 3.7, and patient will receive another oral potassium 7meq prior to dc.  Magnesium was also critically low at 0.8, this was replaced IV. Patient is also being discharged on oral magnesium per week. She should have close followup with her primary care physician or at oncology office and should recheck her labs. Patient will be dc on oral potassium replacement 74meq daily but it is more important that she actually takes them. During hospitalization patient vehemently refused oral  potassium in pill or liquid form. With a lot of convincing, she took oral potassium pills with GI cocktail which have significantly helped in getting the potassium level up.   Endometrial cancer - Continue management per oncology and radiation oncology   Acute renal failure creatinine 1.7 at the time of admission, improved with IV fluid hydration, currently 0.92. No diarrhea  Diarrhea possibly due to radiation colitis, completely resolved and stool studies canceled , I have given her a prescription of Imodium as needed  Severe protein calorie malnutrition in the context of chronic illness as evidenced by 14.7% body weight in <6 months, PO intake <75% for > one month".   UTI  - Urine culture showed 75,000 colonies of gram-negative rods, will follow urine culture and sensitivities, placed on IV Rocephin   Day of Discharge BP 137/65  Pulse 78  Temp(Src) 98.3 F (36.8 C) (Oral)  Resp 18  Ht 5\' 3"  (1.6 m)  Wt 57.108 kg (125 lb 14.4 oz)  BMI 22.31 kg/m2  SpO2 100%  Physical Exam: General: Alert and awake oriented x3 not in any acute distress. CVS: S1-S2 clear no murmur rubs or gallops Chest: clear to auscultation bilaterally, no wheezing rales or rhonchi Abdomen: soft nontender, nondistended, normal bowel sounds Extremities: no cyanosis, clubbing or edema noted bilaterally Neuro: Cranial nerves II-XII intact, no focal neurological deficits   The results of significant diagnostics from this hospitalization (including imaging, microbiology, ancillary and laboratory) are listed below for reference.    LAB RESULTS: Basic Metabolic Panel:  Recent Labs Lab 07/13/13 2000  07/15/13 0345 07/15/13 1556 07/16/13 0440  NA 141  < > 143  --  144  K <2.2*  < > 2.9* 3.1* 3.7  CL 104  < > 111  --  112  CO2 19  < > 17*  --  17*  GLUCOSE 113*  < > 96  --  97  BUN 17  < > 10  --  5*  CREATININE 1.48*  < > 1.19*  --  0.92  CALCIUM 6.3*  < > 6.7*  --  6.9*  MG 0.8*  < > 1.6  --  1.7  PHOS  1.8*  --   --   --   --   < > = values in this interval not displayed. Liver Function Tests:  Recent Labs Lab 07/13/13 1648 07/14/13 0331  AST 18 15  ALT 9 6  ALKPHOS 51 44  BILITOT 0.5 0.4  PROT 7.3 5.7*  ALBUMIN 4.3 3.3*   No results found for this basename: LIPASE, AMYLASE,  in the last 168 hours No results found for this basename: AMMONIA,  in the last 168 hours CBC:  Recent Labs Lab 07/13/13 2000 07/14/13 0331  WBC 5.2 4.0  NEUTROABS 4.0  --   HGB 9.4* 9.1*  HCT 25.8* 25.3*  MCV 93.1 93.7  PLT 262 255   Cardiac Enzymes: No results found for this basename: CKTOTAL, CKMB, CKMBINDEX, TROPONINI,  in the last 168 hours BNP: No components found with this basename: POCBNP,  CBG:  Recent Labs Lab 07/15/13 0746 07/16/13 0732  GLUCAP 89 89    Significant Diagnostic Studies:  No results found.  Home  Disposition and Follow-up:     Discharge Instructions   Diet general    Complete by:  As directed      Increase activity slowly    Complete by:  As directed             DISPOSITION: Home DIET: Regular diet   TESTS THAT NEED FOLLOW-UP BMET, magnesium  DISCHARGE FOLLOW-UP Follow-up Information   Follow up with [name], MD. Schedule an appointment as soon as possible for a visit in 1 week. (for hospital follow-up, obtain labs to check potassium and magnesium)    Specialty:  Family Medicine   Contact information:   [place] [address] [phone]       Time spent on Discharge: 40 minutes  Signed:   Mendel Corning M.D. [facility] 07/16/2013, 9:44 AM Pager: [phone]   **Disclaimer: This note was dictated with voice recognition software. Similar sounding words can inadvertently be transcribed and this note may contain transcription errors which may not have been corrected upon publication of note.**

## 2013-07-19 ENCOUNTER — Telehealth: Payer: Self-pay | Admitting: *Deleted

## 2013-07-19 NOTE — Telephone Encounter (Signed)
CALLED PATIENT TO REMIND OF HDR TX. FOR 07-20-13, NO ANSWER WILL CALL LATER.

## 2013-07-19 NOTE — Telephone Encounter (Signed)
CALLED PATIENT TO REMIND OF HDR [place] 07-20-13 @ 11 AM, SPOKE WITH PATIENT AND SHE IS AWARE OF THIS TREATMENT.

## 2013-07-20 ENCOUNTER — Ambulatory Visit
Admission: RE | Admit: 2013-07-20 | Discharge: 2013-07-20 | Disposition: A | Discharge status: Home / Self Care | Payer: Medicare Other | Source: Ambulatory Visit | Attending: Radiation Oncology | Admitting: Radiation Oncology

## 2013-07-20 ENCOUNTER — Encounter: Payer: Self-pay | Admitting: Radiation Oncology

## 2013-07-20 DIAGNOSIS — Z510 Encounter for antineoplastic radiation therapy: Secondary | ICD-10-CM | POA: Diagnosis not present

## 2013-07-20 DIAGNOSIS — R110 Nausea: Secondary | ICD-10-CM | POA: Diagnosis not present

## 2013-07-20 DIAGNOSIS — Z79899 Other long term (current) drug therapy: Secondary | ICD-10-CM | POA: Diagnosis not present

## 2013-07-20 DIAGNOSIS — C541 Malignant neoplasm of endometrium: Secondary | ICD-10-CM

## 2013-07-20 DIAGNOSIS — C549 Malignant neoplasm of corpus uteri, unspecified: Secondary | ICD-10-CM | POA: Diagnosis not present

## 2013-07-20 DIAGNOSIS — R5383 Other fatigue: Secondary | ICD-10-CM | POA: Diagnosis not present

## 2013-07-20 DIAGNOSIS — R197 Diarrhea, unspecified: Secondary | ICD-10-CM | POA: Diagnosis not present

## 2013-07-20 DIAGNOSIS — R5381 Other malaise: Secondary | ICD-10-CM | POA: Diagnosis not present

## 2013-07-20 NOTE — Progress Notes (Signed)
  Radiation Oncology [phone]  ________________________________  Name: JOLANTA CABEZA MRN: [id number]  Date: 07/20/2013 DOB: [birth year]   Diagnosis: Endometrial cancer  Primary site: Corpus Uteri - Carcinoma  Staging method: AJCC 7th Edition  Clinical: Stage IIIC2 (T3a, N2, M0)  Pathologic: Stage IIIC2 (T3a, N2, M0)  Summary: Stage IIIC2 (T3a, N2, M0)     Simple treatment device note   Patient underwent contruction of  her custom vaginal cylinder to be used for her high-dose-rate treatment. The optimal arrangement was three 3.0 cm rings placed proximally within the vaginal vault. More distally two 2.5 cm rings placed.     Vaginal brachytherapy procedure note   The patient was placed on the high-dose-rate treatment table. A pelvic exam was performed which revealed the vaginal cuff to be intact. No pelvic masses were appreciated. Patient then proceeded to have placement of her custom vaginal cylinder within the proximal vagina. This was affixed to the CT/MR stabilization plate to prevent slippage. Patient tolerated the procedure well.    Simulation Verification Note   NARRATIVE:  A fiducial marker was placed within the vaginal cylinder for imaging purposes. Patient then underwent AP and lateral film which documented accurate placement of the vaginal cylinder for treatment.     High-dose-rate brachytherapy procedure note   After documentation of accurate placement of the vaginal cylinder the high-dose-rate remote afterloading unit was affixed to the vaginal cylinder by catheter system. Patient then proceeded to undergo her second high-dose-rate treatment directed at the proximal vagina. Patient was prescribed a dose of 6 Gy to be delivered to the vaginal mucosal surface. This was Achieved with one channel using 7 dwell positions. Treatment length was 3 cm. Iridium 192 was the high-dose-rate source. Treatment time was 271.4 seconds. Patient tolerated the procedure well. After  completion of her therapy a radiation survey was performed documenting return of the iridium source into the Nucletron safe period. The patient will return next week for her third high-dose-rate treatment.  -----------------------------------   Blair Promise, PhD, MD

## 2013-07-26 ENCOUNTER — Telehealth: Payer: Self-pay | Admitting: *Deleted

## 2013-07-26 NOTE — Telephone Encounter (Signed)
Called patient to remind of HDR Tx. For 07-27-13 @ 11 am, spoke with patient and she is aware of this appt.

## 2013-07-27 ENCOUNTER — Encounter: Payer: Self-pay | Admitting: Radiation Oncology

## 2013-07-27 ENCOUNTER — Ambulatory Visit
Admission: RE | Admit: 2013-07-27 | Discharge: 2013-07-27 | Disposition: A | Discharge status: Home / Self Care | Payer: Medicare Other | Source: Ambulatory Visit | Attending: Radiation Oncology | Admitting: Radiation Oncology

## 2013-07-27 DIAGNOSIS — Z510 Encounter for antineoplastic radiation therapy: Secondary | ICD-10-CM | POA: Diagnosis not present

## 2013-07-27 DIAGNOSIS — C541 Malignant neoplasm of endometrium: Secondary | ICD-10-CM

## 2013-07-27 NOTE — Progress Notes (Signed)
Blair Promise, MD (Physician)       Radiation Oncology [phone]  ________________________________  Name: TOWANA STENGLEIN MRN: [id number]  Date: 07/27/2013 DOB: [birth year]   Diagnosis: Endometrial cancer  Primary site: Corpus Uteri - Carcinoma  Staging method: AJCC 7th Edition  Clinical: Stage IIIC2 (T3a, N2, M0)  Pathologic: Stage IIIC2 (T3a, N2, M0)  Summary: Stage IIIC2 (T3a, N2, M0)   Simple treatment device note   Patient underwent contruction of her custom vaginal cylinder to be used for her high-dose-rate treatment. The optimal arrangement was three 3.0 cm rings placed proximally within the vaginal vault. More distally two 2.5 cm rings placed.   Vaginal brachytherapy procedure note   The patient was placed on the high-dose-rate treatment table. A pelvic exam was performed which revealed the vaginal cuff to be intact. No pelvic masses were appreciated. Patient then proceeded to have placement of her custom vaginal cylinder within the proximal vagina. This was affixed to the CT/MR stabilization plate to prevent slippage. Patient tolerated the procedure well.   Simulation Verification Note   NARRATIVE: A fiducial marker was placed within the vaginal cylinder for imaging purposes. Patient then underwent AP and lateral film which documented accurate placement of the vaginal cylinder for treatment.     High-dose-rate brachytherapy procedure note   After documentation of accurate placement of the vaginal cylinder the high-dose-rate remote afterloading unit was affixed to the vaginal cylinder by catheter system. Patient then proceeded to undergo her third high-dose-rate treatment directed at the proximal vagina. Patient was prescribed a dose of 6 Gy to be delivered to the vaginal mucosal surface. This was Achieved with one channel using 7 dwell positions. Treatment length was 3 cm. Iridium 192 was the high-dose-rate source. Treatment time was 290.0 seconds. Patient tolerated the  procedure well. After completion of her therapy a radiation survey was performed documenting return of the iridium source into the Nucletron safe period. The patient will return in one month for routine followup  -----------------------------------   Blair Promise, PhD, MD

## 2013-07-31 ENCOUNTER — Other Ambulatory Visit: Payer: Self-pay | Admitting: Oncology

## 2013-08-03 ENCOUNTER — Ambulatory Visit (INDEPENDENT_AMBULATORY_CARE_PROVIDER_SITE_OTHER): Payer: Medicare Other | Admitting: Family Medicine

## 2013-08-03 ENCOUNTER — Other Ambulatory Visit: Payer: Medicare Other

## 2013-08-03 ENCOUNTER — Encounter: Payer: Self-pay | Admitting: Oncology

## 2013-08-03 ENCOUNTER — Ambulatory Visit (HOSPITAL_BASED_OUTPATIENT_CLINIC_OR_DEPARTMENT_OTHER): Payer: Medicare Other | Admitting: Oncology

## 2013-08-03 ENCOUNTER — Telehealth: Payer: Self-pay | Admitting: *Deleted

## 2013-08-03 ENCOUNTER — Encounter: Payer: Self-pay | Admitting: Family Medicine

## 2013-08-03 VITALS — BP 114/62 | HR 80 | Temp 98.5°F | Resp 14 | Ht 62.0 in | Wt 121.0 lb

## 2013-08-03 VITALS — BP 158/78 | HR 85 | Temp 99.5°F | Resp 17 | Ht 62.0 in | Wt 120.1 lb

## 2013-08-03 DIAGNOSIS — C549 Malignant neoplasm of corpus uteri, unspecified: Secondary | ICD-10-CM

## 2013-08-03 DIAGNOSIS — R197 Diarrhea, unspecified: Secondary | ICD-10-CM

## 2013-08-03 DIAGNOSIS — E876 Hypokalemia: Secondary | ICD-10-CM

## 2013-08-03 DIAGNOSIS — H6121 Impacted cerumen, right ear: Secondary | ICD-10-CM

## 2013-08-03 DIAGNOSIS — H6120 Impacted cerumen, unspecified ear: Secondary | ICD-10-CM

## 2013-08-03 DIAGNOSIS — C541 Malignant neoplasm of endometrium: Secondary | ICD-10-CM

## 2013-08-03 DIAGNOSIS — T451X5A Adverse effect of antineoplastic and immunosuppressive drugs, initial encounter: Secondary | ICD-10-CM

## 2013-08-03 DIAGNOSIS — D6481 Anemia due to antineoplastic chemotherapy: Secondary | ICD-10-CM

## 2013-08-03 DIAGNOSIS — E43 Unspecified severe protein-calorie malnutrition: Secondary | ICD-10-CM

## 2013-08-03 LAB — CBC WITH DIFFERENTIAL/PLATELET
BASOS ABS: 0 10*3/uL (ref 0.0–0.1)
Basophils Relative: 1 % (ref 0–1)
Eosinophils Absolute: 0.2 10*3/uL (ref 0.0–0.7)
Eosinophils Relative: 4 % (ref 0–5)
HCT: 30.9 % — ABNORMAL LOW (ref 36.0–46.0)
Hemoglobin: 10.2 g/dL — ABNORMAL LOW (ref 12.0–15.0)
LYMPHS ABS: 0.7 10*3/uL (ref 0.7–4.0)
Lymphocytes Relative: 18 % (ref 12–46)
MCH: 32.9 pg (ref 26.0–34.0)
MCHC: 33 g/dL (ref 30.0–36.0)
MCV: 99.7 fL (ref 78.0–100.0)
Monocytes Absolute: 0.4 10*3/uL (ref 0.1–1.0)
Monocytes Relative: 11 % (ref 3–12)
NEUTROS PCT: 66 % (ref 43–77)
Neutro Abs: 2.6 10*3/uL (ref 1.7–7.7)
PLATELETS: 323 10*3/uL (ref 150–400)
RBC: 3.1 MIL/uL — AB (ref 3.87–5.11)
RDW: 14.1 % (ref 11.5–15.5)
WBC: 3.9 10*3/uL — ABNORMAL LOW (ref 4.0–10.5)

## 2013-08-03 LAB — BASIC METABOLIC PANEL WITH GFR
BUN: 14 mg/dL (ref 6–23)
CALCIUM: 9.4 mg/dL (ref 8.4–10.5)
CO2: 24 mEq/L (ref 19–32)
CREATININE: 1 mg/dL (ref 0.50–1.10)
Chloride: 105 mEq/L (ref 96–112)
GFR, EST AFRICAN AMERICAN: 62 mL/min
GFR, Est Non African American: 54 mL/min — ABNORMAL LOW
GLUCOSE: 97 mg/dL (ref 70–99)
Potassium: 4.7 mEq/L (ref 3.5–5.3)
Sodium: 139 mEq/L (ref 135–145)

## 2013-08-03 LAB — MAGNESIUM: MAGNESIUM: 1.6 mg/dL (ref 1.5–2.5)

## 2013-08-03 MED ORDER — CARBAMIDE PEROXIDE 6.5 % OT SOLN: 5.0000 [drp] | Freq: Two times a day (BID) | OTIC | Status: DC

## 2013-08-03 NOTE — Progress Notes (Signed)
Patient ID: Ebony Herring, female   DOB: Jan 25, 1932, 80 y.o.   MRN: [id number]   Subjective:    Patient ID: Ebony Herring, female    DOB: Feb 26, 1931, 80 y.o.   MRN: [id number]  Patient presents for B ears stopped up and Labs  patient here to followup labs. She was recently admitted to [facility] long secondary to chronic hypokalemia and dehydration. Her potassium level was back to normal when she was released about 2 weeks ago. She also had to have magnesium infused a few weeks ago. She states that she feels fairly well she is status post hysterectomy for endometrial cancer she's also had 3 rounds of chemotherapy 28 rounds of radiation and will start another 3 rounds of chemotherapy. She is trying to move around some and her appetite has improved she is gaining 5 pounds over the past couple weeks. She does complain of feeling like her right ear is clogged she gets cerumen impaction will like [place] to remove this as well today. She has no complaints of chest pain shortness of breath nausea vomiting no diarrhea.    Review Of Systems:  GEN- + fatigue, fever, weight loss,weakness, recent illness HEENT- denies eye drainage, change in vision, nasal discharge, CVS- denies chest pain, palpitations RESP- denies SOB, cough, wheeze ABD- denies N/V, change in stools, abd pain GU- denies dysuria, hematuria, dribbling, incontinence MSK- denies joint pain, muscle aches, injury Neuro- denies headache, dizziness, syncope, seizure activity       Objective:    BP 114/62  Pulse 80  Temp(Src) 98.5 F (36.9 C) (Oral)  Resp 14  Ht 5\' 2"  (1.575 m)  Wt 121 lb (54.885 kg)  BMI 22.13 kg/m2 GEN- NAD, alert and oriented x3 HEENT- PERRL, EOMI, non injected sclera, pink conjunctiva, MMM, oropharynx clear, Right canal impacted, left canal clear, TM clear no effusion Neck- Supple, no LAD CVS- RRR, no murmur RESP- occasional expiratory wheeze, no rhonchi no rales, normal WOB EXT- No edema Pulses- Radial 2+         Assessment & Plan:      Problem List Items Addressed This Visit   Hypokalemia - Primary   Relevant Orders      BASIC METABOLIC PANEL WITH GFR      CBC with Differential      Magnesium   Endometrial cancer   Relevant Orders      CBC with Differential      Magnesium   Cerumen impaction    Other Visit Diagnoses   Hypomagnesemia           Note: This dictation was prepared with Dragon dictation along with smaller phrase technology. Any transcriptional errors that result from this process are unintentional.

## 2013-08-03 NOTE — Telephone Encounter (Signed)
Call placed to Dr. Clabe Seal office [phone]).   Message left on VM of Dr. Clabe Seal nurse.

## 2013-08-03 NOTE — Patient Instructions (Signed)
Use drop at needed We will call with labs  F/U as needed

## 2013-08-03 NOTE — Assessment & Plan Note (Signed)
She has gained about 5 pounds and her appetite is improving

## 2013-08-03 NOTE — Assessment & Plan Note (Signed)
I will recheck her potassium level she's currently on 10 mEq a day. I will also recheck her magnesium

## 2013-08-03 NOTE — Progress Notes (Signed)
OFFICE PROGRESS NOTE   08/03/2013   Physicians:Daniel ClarkePearson, [place], [place] F (PCP [facility]), Gery Pray, Everlene Farrier, Donato Heinz (ophth)   INTERVAL HISTORY:  Patient is seen, alone for visit, in continuing attention to adjuvant treatment in process for IIIC2 high grade serous endometrial carcinoma, having completed radiation on 07-27-13 and now to have additional 3 cycles of taxol and carboplatin chemotherapy.  She was hospitalized from 6-3 thru 04-14-44 with complications of radiation induced diarrhea, with severe hypokalemia and hypomagnesemia (<2.2 and 0.8 respectively), with acute renal failure. Fortunately all of those problems resolved well with interventions in hospital, tho patient is angry about circumstances surrounding the admission and about care in hospital, which she has expressed at length to me today. I have suggested that she can contact hospital administration, however I have also made it very clear to her that the dehydration and electrolyte imbalance was very serious. I have offered to change her medical oncology follow up to a different physician, which she does not want to do.  Diarrhea has resolved and she is eating and drinking well now. Energy is improved, tho she is not doing gardening. IV access was difficult with first 3 cycles of chemotherapy and more difficult during recent hospitalization; she agrees with [name] placement by IR prior to resuming chemotherapy.   She is to see Dr Sondra Come on 09-05-13 and Dr Josephina Shih on 09-09-13.   ONCOLOGIC HISTORY Patient presented to PCP with vaginal discharge with some bleeding since Nov 2014. 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 MRI pelvis 01-20-13 Had uterine mass and prominent bilateral iliac nodes. She had TAH BSO and pelvic and para-aortic lymphadenectomy by Dr Josephina Shih on 03-01-2013. Pathology [phone]) had high  grade serous carcinoma of endometrium extending to outer half of myometrium, with extensive angiolymphatic invasion, involvement of right ovary, involvement of 2/9 pelvic nodes and 3/4 para-aortic nodes. At completion of surgery there was no gross residual disease. She had 3 cycles of taxol carboplatin from 03-28-13 thru 05-09-2013, with neupogen support. She had pelvic RT 45 Gy thru ~ 07-05-13 and HDR x4 thru 07-27-13.  Review of systems as above, also: No fever or symptoms of infection. No SOB or cough. No abdominal or pelvic pain. No LE swelling. No bleeding. No nausea now. No peripheral neuropathy complaints. Remainder of 10 point Review of Systems negative.  Objective:  Vital signs in last 24 hours:  BP 158/78  Pulse 85  Temp(Src) 99.5 F (37.5 C) (Oral)  Resp 17  Ht $R'5\' 2"'da$  (1.575 m)  Wt 120 lb 1.6 oz (54.477 kg)  BMI 21.96 kg/m2  SpO2 96% Weight is up 1 lb Alert, oriented and appropriate. Ambulatory without difficulty.  Alopecia  HEENT:PERRL, sclerae not icteric. Oral mucosa moist without lesions, posterior pharynx clear.  Neck supple. No JVD.  Lymphatics:no cervical,suraclavicular, axillary or inguinal adenopathy Resp: clear to auscultation bilaterally and normal percussion bilaterally Cardio: regular rate and rhythm. No gallop. GI: soft, nontender, not distended, no mass or organomegaly. Normally active bowel sounds. Surgical incision not remarkable. Musculoskeletal/ Extremities: without pitting edema, cords, tenderness Neuro: no peripheral neuropathy. Otherwise nonfocal Skin without rash, ecchymosis, petechiae   Lab Results: CBC and BMET were done by PCP earlier today. BMET had not resulted at time of visit.  Results for orders placed in visit on 70/01/74  BASIC METABOLIC PANEL WITH GFR      Result Value Ref Range   Sodium  135 - 145 mEq/L   Potassium    3.5 - 5.3 mEq/L   Chloride    96 - 112 mEq/L   CO2    19 - 32 mEq/L   Glucose, Bld    70 - 99 mg/dL   BUN    6 -  23 mg/dL   Creat    0.50 - 1.35 mg/dL   Calcium    8.4 - 10.5 mg/dL   GFR, Est African American       GFR, Est Non African American      CBC WITH DIFFERENTIAL      Result Value Ref Range   WBC 3.9 (*) 4.0 - 10.5 K/uL   RBC 3.10 (*) 3.87 - 5.11 MIL/uL   Hemoglobin 10.2 (*) 12.0 - 15.0 g/dL   HCT 30.9 (*) 36.0 - 46.0 %   MCV 99.7  78.0 - 100.0 fL   MCH 32.9  26.0 - 34.0 pg   MCHC 33.0  30.0 - 36.0 g/dL   RDW 14.1  11.5 - 15.5 %   Platelets 323  150 - 400 K/uL   Neutrophils Relative % 66  43 - 77 %   Neutro Abs 2.6  1.7 - 7.7 K/uL   Lymphocytes Relative 18  12 - 46 %   Lymphs Abs 0.7  0.7 - 4.0 K/uL   Monocytes Relative 11  3 - 12 %   Monocytes Absolute 0.4  0.1 - 1.0 K/uL   Eosinophils Relative 4  0 - 5 %   Eosinophils Absolute 0.2  0.0 - 0.7 K/uL   Basophils Relative 1  0 - 1 %   Basophils Absolute 0.0  0.0 - 0.1 K/uL   Smear Review Criteria for review not met      BMET available after visit Na 139, K 4.7, Cl 105, CO2 24, glu 97, BUN 14, creat 1.0 ca 9.4 Mg also available after visit 1.6  Studies/Results:  No results found.  Medications: I have reviewed the patient's current medications. Refill decadron premed for taxol. She does not need refills on antiemetics.  DISCUSSION: [name] by IR necessary for last 3 cycles chemo, scheduling to be coordinated.  Assessment/Plan:  1.IIIC2 high grade serous endometrial carcinoma: surgery 03-01-13, 3 cycles of taxol carboplatin 03-28-13 thru 05-09-13 with gCSF required, pelvic RT + HDR by Dr Sondra Come completed 07-27-13, now additional 3 cycles chemo upcoming.  2.Difficult peripheral IV access: very high risk disease, so reasonable to place [name] even tho only 3 cycles remain at this point in care. I did not discuss with her now, but would be best to keep [name] in until we see how disease responds after this chemotherapy completes. 3.RT diarrhea with severe dehydration/ ARF/ hypoK and hypoMg: hospitalization necessary and resolved these issues early  June. 4.anemia: related to chemo, as above  5.glaucoma  6.no mammograms in years, no colonoscoppy   Patient seemed to understand discussion, had all questions answered to her satisfaction and is in agreement with plan. Chemo orders done with [place] AUC=4 due to recent pelvic RT, taxol at 175 mg/m2. Granix 300 daily x3 beginning day 2.   Gordy Levan, MD   08/03/2013, 4:51 PM

## 2013-08-03 NOTE — Assessment & Plan Note (Signed)
Status post removal of the bedside she doesn't to get cerumen impaction therefore have given her some Debrox drops that she can use

## 2013-08-03 NOTE — Assessment & Plan Note (Signed)
She is followed by oncology as well as GYN. She is doing fairly well with her treatments. She will be seen this afternoon and will inquire about a Port-A-Cath as they had a lot of difficulty getting blood and keeping the IV in her arm. Abdomen magnesium level and we will draw an extra tube of blood in case there is some other labs they would like to have done by oncology today

## 2013-08-03 NOTE — Telephone Encounter (Signed)
Message copied by Sheral Flow on Wed Aug 03, 2013  1:55 PM ------      Message from: [name] F      Created: Wed Aug 03, 2013  1:29 PM      Regarding: Call oncology about labs              I drew CBC,BMET, Mg,      We did have an extra tube, was there anything else they needed ordered so she does not have to have another blood draw ------

## 2013-08-04 ENCOUNTER — Telehealth: Payer: Self-pay | Admitting: Oncology

## 2013-08-04 ENCOUNTER — Telehealth: Payer: Self-pay | Admitting: *Deleted

## 2013-08-04 ENCOUNTER — Encounter: Payer: Self-pay | Admitting: Oncology

## 2013-08-04 NOTE — Telephone Encounter (Signed)
Per staff message and POF I have scheduled appts. Advised scheduler of appts. JMW  

## 2013-08-04 NOTE — Telephone Encounter (Signed)
, °

## 2013-08-05 ENCOUNTER — Telehealth: Payer: Self-pay

## 2013-08-05 DIAGNOSIS — C541 Malignant neoplasm of endometrium: Secondary | ICD-10-CM

## 2013-08-05 MED ORDER — DEXAMETHASONE 4 MG PO TABS: ORAL_TABLET | ORAL | Status: DC

## 2013-08-05 NOTE — Telephone Encounter (Signed)
Told Ms. Longton theat the decadron prescription was sent to her pharmacy. Told her that her KCL level was 4.7 on 08-03-13.  Dr. Marko Plume said from her standpoint she could stop the potassium tablets. Pt. Verbalized understanding.

## 2013-08-05 NOTE — Telephone Encounter (Signed)
Message copied by Baruch Merl on Fri Aug 05, 2013 10:06 AM ------      Message from: Gordy Levan      Created: Thu Aug 04, 2013  4:12 PM       Please do script for decadron #10 with 2 refills, same as before for taxol premed      thanks ------

## 2013-08-08 ENCOUNTER — Encounter: Payer: Self-pay | Admitting: *Deleted

## 2013-08-08 ENCOUNTER — Other Ambulatory Visit: Payer: Self-pay | Admitting: *Deleted

## 2013-08-08 MED ORDER — POTASSIUM CHLORIDE ER 10 MEQ PO TBCR: 10.0000 meq | EXTENDED_RELEASE_TABLET | Freq: Every day | ORAL | Status: DC

## 2013-08-10 ENCOUNTER — Other Ambulatory Visit: Payer: Self-pay | Admitting: Radiology

## 2013-08-12 ENCOUNTER — Encounter (HOSPITAL_COMMUNITY): Payer: Self-pay | Admitting: Pharmacy Technician

## 2013-08-15 ENCOUNTER — Encounter: Payer: Self-pay | Admitting: Radiation Oncology

## 2013-08-15 NOTE — Progress Notes (Signed)
  Radiation Oncology         [phone] ________________________________  Name: Ebony Herring MRN: [id number]  Date: 08/15/2013  DOB: 06-22-1931  End of Treatment Note  Diagnosis:   Endometrial cancer   Primary site: Corpus Uteri - Carcinoma   Staging method: AJCC 7th Edition   Clinical: Stage IIIC2 (T3a, N2, M0)   Pathologic: Stage IIIC2 (T3a, N2, M0)   Summary: Stage IIIC2 (T3a, N2, M0)      Indication for treatment:  Postop, high risk for recurrence     Radiation treatment dates:   April 22 through May 28 for external beam radiation therapy, intracavitary brachytherapy treatments on June 3, and June 10, June 17  Site/dose:   Pelvis and periaortic region 45 gray in 25 fractions, vaginal cuff boost of 18 gray using high-dose rate techniques,  iridium 192  Beams/energy:   External beam, helical intensity modulated radiation therapy, 6 MV photons,  brachytherapy was performed with a vaginal cylinder, 3 cm diameter, 3 CM treatment length  Narrative: The patient tolerated radiation treatment relatively well.   She did experience some problems with diarrhea and did require admission to the hospital for management of a critically low potassium  Plan: The patient has completed radiation treatment. The patient will return to radiation oncology clinic for routine followup in one month. I advised them to call or return sooner if they have any questions or concerns related to their recovery or treatment.  -----------------------------------  Blair Promise, PhD, MD

## 2013-08-16 ENCOUNTER — Other Ambulatory Visit: Payer: Self-pay | Admitting: Oncology

## 2013-08-16 ENCOUNTER — Encounter (HOSPITAL_COMMUNITY): Payer: Self-pay

## 2013-08-16 ENCOUNTER — Ambulatory Visit (HOSPITAL_COMMUNITY)
Admission: RE | Admit: 2013-08-16 | Discharge: 2013-08-16 | Disposition: A | Discharge status: Home / Self Care | Payer: Medicare Other | Source: Ambulatory Visit | Attending: Oncology | Admitting: Oncology

## 2013-08-16 DIAGNOSIS — H409 Unspecified glaucoma: Secondary | ICD-10-CM | POA: Insufficient documentation

## 2013-08-16 DIAGNOSIS — C541 Malignant neoplasm of endometrium: Secondary | ICD-10-CM

## 2013-08-16 DIAGNOSIS — Z79899 Other long term (current) drug therapy: Secondary | ICD-10-CM | POA: Insufficient documentation

## 2013-08-16 DIAGNOSIS — C549 Malignant neoplasm of corpus uteri, unspecified: Secondary | ICD-10-CM | POA: Insufficient documentation

## 2013-08-16 LAB — CBC WITH DIFFERENTIAL/PLATELET
BASOS PCT: 0 % (ref 0–1)
Basophils Absolute: 0 10*3/uL (ref 0.0–0.1)
EOS ABS: 0.4 10*3/uL (ref 0.0–0.7)
Eosinophils Relative: 8 % — ABNORMAL HIGH (ref 0–5)
HCT: 30 % — ABNORMAL LOW (ref 36.0–46.0)
Hemoglobin: 9.8 g/dL — ABNORMAL LOW (ref 12.0–15.0)
Lymphocytes Relative: 21 % (ref 12–46)
Lymphs Abs: 1 10*3/uL (ref 0.7–4.0)
MCH: 32.9 pg (ref 26.0–34.0)
MCHC: 32.7 g/dL (ref 30.0–36.0)
MCV: 100.7 fL — ABNORMAL HIGH (ref 78.0–100.0)
Monocytes Absolute: 0.4 10*3/uL (ref 0.1–1.0)
Monocytes Relative: 8 % (ref 3–12)
NEUTROS PCT: 63 % (ref 43–77)
Neutro Abs: 2.9 10*3/uL (ref 1.7–7.7)
PLATELETS: 241 10*3/uL (ref 150–400)
RBC: 2.98 MIL/uL — ABNORMAL LOW (ref 3.87–5.11)
RDW: 12.9 % (ref 11.5–15.5)
WBC: 4.6 10*3/uL (ref 4.0–10.5)

## 2013-08-16 LAB — PROTIME-INR
INR: 0.94 (ref 0.00–1.49)
Prothrombin Time: 12.6 seconds (ref 11.6–15.2)

## 2013-08-16 LAB — APTT: aPTT: 24 seconds (ref 24–37)

## 2013-08-16 MED ORDER — FENTANYL CITRATE 0.05 MG/ML IJ SOLN
INTRAMUSCULAR | Status: AC
  Filled 2013-08-16: qty 4

## 2013-08-16 MED ORDER — FENTANYL CITRATE 0.05 MG/ML IJ SOLN
INTRAMUSCULAR | Status: AC | PRN
  Administered 2013-08-16 (×2): 50 ug via INTRAVENOUS

## 2013-08-16 MED ORDER — HEPARIN SOD (PORK) LOCK FLUSH 100 UNIT/ML IV SOLN
500.0000 [IU] | Freq: Once | INTRAVENOUS | Status: AC
  Administered 2013-08-16: 500 [IU] via INTRAVENOUS

## 2013-08-16 MED ORDER — MIDAZOLAM HCL 2 MG/2ML IJ SOLN
INTRAMUSCULAR | Status: AC
  Filled 2013-08-16: qty 4

## 2013-08-16 MED ORDER — CEFAZOLIN SODIUM-DEXTROSE 2-3 GM-% IV SOLR
INTRAVENOUS | Status: AC
  Filled 2013-08-16: qty 50

## 2013-08-16 MED ORDER — HEPARIN SOD (PORK) LOCK FLUSH 100 UNIT/ML IV SOLN
INTRAVENOUS | Status: AC
  Filled 2013-08-16: qty 5

## 2013-08-16 MED ORDER — LIDOCAINE-EPINEPHRINE (PF) 2 %-1:200000 IJ SOLN
INTRAMUSCULAR | Status: AC
  Filled 2013-08-16: qty 20

## 2013-08-16 MED ORDER — CEFAZOLIN SODIUM-DEXTROSE 2-3 GM-% IV SOLR
2.0000 g | INTRAVENOUS | Status: AC
  Administered 2013-08-16: 2 g via INTRAVENOUS

## 2013-08-16 MED ORDER — SODIUM CHLORIDE 0.9 % IV SOLN
INTRAVENOUS | Status: DC
Start: 2013-08-16 — End: 2013-08-17
  Administered 2013-08-16: 09:00:00 via INTRAVENOUS

## 2013-08-16 MED ORDER — MIDAZOLAM HCL 2 MG/2ML IJ SOLN
INTRAMUSCULAR | Status: AC | PRN
  Administered 2013-08-16 (×2): 1 mg via INTRAVENOUS

## 2013-08-16 NOTE — Procedures (Signed)
Successful placement of right IJ approach port-a-cath with tip at the superior caval atrial junction. The catheter is ready for immediate use. No immediate post procedural complications. 

## 2013-08-16 NOTE — H&P (Signed)
Chief Complaint: "I'm here for a port" Referring Physician:[place] HPI: Ebony Herring is an 80 y.o. female with endometrial cancer. She had a first round of chemotherapy and has just completed radiation as well. She is to start another round of chemo but has had trouble with IV access. She is scheduled for port placement. PMHx and meds reviewed.  Past Medical History:  Past Medical History  Diagnosis Date  . Glaucoma     High pressure- ?pre glaucoma  . Cancer     ENDOMETRIAL CANCER    Past Surgical History:  Past Surgical History  Procedure Laterality Date  . Cholecystectomy    . Appendectomy    . Abdominal hysterectomy N/A 03/01/2013    Procedure: HYSTERECTOMY ABDOMINAL TOTAL/BILATERAL SALPINGO OOPHORECTOMY/WITH PELVIC AND PARA AORTIC LYMPHADNECTOMY ;  Surgeon: Alvino Chapel, MD;  Location: [facility];  Service: Gynecology;  Laterality: N/A;  . Salpingoophorectomy Bilateral 03/01/2013    Procedure: BILATERAL SALPINGO OOPHORECTOMY;  Surgeon: Alvino Chapel, MD;  Location: [facility];  Service: Gynecology;  Laterality: Bilateral;    Family History:  Family History  Problem Relation Age of Onset  . Breast cancer Maternal Aunt     x 4    Social History:  reports that she has never smoked. She has never used smokeless tobacco. She reports that she does not drink alcohol or use illicit drugs.  Allergies:  Allergies  Allergen Reactions  . Bactrim [Sulfamethoxazole-Trimethoprim] Other (See Comments)    Made her see things  . Ciprofloxacin Other (See Comments)    Made her see things    Medications:   Medication List    ASK your doctor about these medications       dexamethasone 4 MG tablet  Commonly known as:  DECADRON  Take 5 tabs = 20 mg with food 12 hours and 6 hours prior to Taxol chemotherapy.     ferrous fumarate 325 (106 FE) MG Tabs tablet  Commonly known as:  HEMOCYTE - 106 mg FE  Take 1 tablet by mouth daily.     LORazepam 0.5 MG tablet  Commonly  known as:  ATIVAN  Take 1 tablet under the tongue or swallow every 6 hours as needed for nausea     ondansetron 8 MG tablet  Commonly known as:  ZOFRAN  Take 1-2 tablets (8-16 mg total) by mouth every 12 (twelve) hours as needed for nausea or vomiting.     pantoprazole 40 MG tablet  Commonly known as:  PROTONIX  Take 1 tablet (40 mg total) by mouth daily.     SIMBRINZA 1-0.2 % Susp  Generic drug:  Brinzolamide-Brimonidine  Place 1 drop into both eyes 2 (two) times daily.        Please HPI for pertinent positives, otherwise complete 10 system ROS negative.  Physical Exam: BP 162/83  Pulse 84  Temp(Src) 98.8 F (37.1 C) (Oral)  Resp 16  SpO2 99% There is no weight on file to calculate BMI.   General Appearance:  Alert, cooperative, no distress, appears stated age  Head:  Normocephalic, without obvious abnormality, atraumatic  ENT: Unremarkable  Neck: Supple, symmetrical, trachea midline  Lungs:   Clear to auscultation bilaterally, no w/r/r, respirations unlabored without use of accessory muscles.  Chest Wall:  No tenderness or deformity  Heart:  Regular rate and rhythm, S1, S2 normal, no murmur, rub or gallop.  Neurologic: Normal affect, no gross deficits.   Results for orders placed during the hospital encounter of 08/16/13 (from the  past 48 hour(s))  CBC WITH DIFFERENTIAL     Status: Abnormal   Collection Time    08/16/13  8:51 AM      Result Value Ref Range   WBC 4.6  4.0 - 10.5 K/uL   RBC 2.98 (*) 3.87 - 5.11 MIL/uL   Hemoglobin 9.8 (*) 12.0 - 15.0 g/dL   HCT 30.0 (*) 36.0 - 46.0 %   MCV 100.7 (*) 78.0 - 100.0 fL   MCH 32.9  26.0 - 34.0 pg   MCHC 32.7  30.0 - 36.0 g/dL   RDW 12.9  11.5 - 15.5 %   Platelets 241  150 - 400 K/uL   Neutrophils Relative % 63  43 - 77 %   Neutro Abs 2.9  1.7 - 7.7 K/uL   Lymphocytes Relative 21  12 - 46 %   Lymphs Abs 1.0  0.7 - 4.0 K/uL   Monocytes Relative 8  3 - 12 %   Monocytes Absolute 0.4  0.1 - 1.0 K/uL   Eosinophils  Relative 8 (*) 0 - 5 %   Eosinophils Absolute 0.4  0.0 - 0.7 K/uL   Basophils Relative 0  0 - 1 %   Basophils Absolute 0.0  0.0 - 0.1 K/uL   No results found.  Assessment/Plan Endometrial cancer For Port placement Discussed procedure, risks, complications, use of sedation Labs reviewed. Consent signed in chart  Ascencion Dike PA-C 08/16/2013, 9:13 AM

## 2013-08-16 NOTE — Discharge Instructions (Signed)
Implanted Port Home Guide °An implanted port is a type of central line that is placed under the skin. Central lines are used to provide IV access when treatment or nutrition needs to be given through a person's veins. Implanted ports are used for long-term IV access. An implanted port may be placed because:  °· You need IV medicine that would be irritating to the small veins in your hands or arms.   °· You need long-term IV medicines, such as antibiotics.   °· You need IV nutrition for a long period.   °· You need frequent blood draws for lab tests.   °· You need dialysis.   °Implanted ports are usually placed in the chest area, but they can also be placed in the upper arm, the abdomen, or the leg. An implanted port has two main parts:  °· Reservoir. The reservoir is round and will appear as a small, raised area under your skin. The reservoir is the part where a needle is inserted to give medicines or draw blood.   °· Catheter. The catheter is a thin, flexible tube that extends from the reservoir. The catheter is placed into a large vein. Medicine that is inserted into the reservoir goes into the catheter and then into the vein.   °HOW WILL I CARE FOR MY INCISION SITE? °Do not get the incision site wet. Bathe or shower as directed by your health care provider.  °HOW IS MY PORT ACCESSED? °Special steps must be taken to access the port:  °· Before the port is accessed, a numbing cream can be placed on the skin. This helps numb the skin over the port site.   °· Your health care provider uses a sterile technique to access the port. °¨ Your health care provider must put on a mask and sterile gloves. °¨ The skin over your port is cleaned carefully with an antiseptic and allowed to dry. °¨ The port is gently pinched between sterile gloves, and a needle is inserted into the port. °· Only "non-coring" port needles should be used to access the port. Once the port is accessed, a blood return should be checked. This helps  ensure that the port is in the vein and is not clogged.   °· If your port needs to remain accessed for a constant infusion, a clear (transparent) bandage will be placed over the needle site. The bandage and needle will need to be changed every week, or as directed by your health care provider.   °· Keep the bandage covering the needle clean and dry. Do not get it wet. Follow your health care provider's instructions on how to take a shower or bath while the port is accessed.   °· If your port does not need to stay accessed, no bandage is needed over the port.   °WHAT IS FLUSHING? °Flushing helps keep the port from getting clogged. Follow your health care provider's instructions on how and when to flush the port. Ports are usually flushed with saline solution or a medicine called heparin. The need for flushing will depend on how the port is used.  °· If the port is used for intermittent medicines or blood draws, the port will need to be flushed:   °¨ After medicines have been given.   °¨ After blood has been drawn.   °¨ As part of routine maintenance.   °· If a constant infusion is running, the port may not need to be flushed.   °HOW LONG WILL MY PORT STAY IMPLANTED? °The port can stay in for as long as your health care   provider thinks it is needed. When it is time for the port to come out, surgery will be done to remove it. The procedure is similar to the one performed when the port was put in.  °WHEN SHOULD I SEEK IMMEDIATE MEDICAL CARE? °When you have an implanted port, you should seek immediate medical care if:  °· You notice a bad smell coming from the incision site.   °· You have swelling, redness, or drainage at the incision site.   °· You have more swelling or pain at the port site or the surrounding area.   °· You have a fever that is not controlled with medicine. °Document Released: 01/27/2005 Document Revised: 11/17/2012 Document Reviewed: 10/04/2012 °ExitCare® Patient Information ©2015 [employer]. This  information is not intended to replace advice given to you by your health care provider. Make sure you discuss any questions you have with your health care provider. ° °Conscious Sedation, Adult, Care After °Refer to this sheet in the next few weeks. These instructions provide you with information on caring for yourself after your procedure. Your health care provider may also give you more specific instructions. Your treatment has been planned according to current medical practices, but problems sometimes occur. Call your health care provider if you have any problems or questions after your procedure. °WHAT TO EXPECT AFTER THE PROCEDURE  °After your procedure: °· You may feel sleepy, clumsy, and have poor balance for several hours. °· Vomiting may occur if you eat too soon after the procedure. °HOME CARE INSTRUCTIONS °· Do not participate in any activities where you could become injured for at least 24 hours. Do not: °¨ Drive. °¨ Swim. °¨ Ride a bicycle. °¨ Operate heavy machinery. °¨ Cook. °¨ Use power tools. °¨ Climb ladders. °¨ Work from a high place. °· Do not make important decisions or sign legal documents until you are improved. °· If you vomit, drink water, juice, or soup when you can drink without vomiting. Make sure you have little or no nausea before eating solid foods. °· Only take over-the-counter or prescription medicines for pain, discomfort, or fever as directed by your health care provider. °· Make sure you and your family fully understand everything about the medicines given to you, including what side effects may occur. °· You should not drink alcohol, take sleeping pills, or take medicines that cause drowsiness for at least 24 hours. °· If you smoke, do not smoke without supervision. °· If you are feeling better, you may resume normal activities 24 hours after you were sedated. °· Keep all appointments with your health care provider. °SEEK MEDICAL CARE IF: °· Your skin is pale or bluish in  color. °· You continue to feel nauseous or vomit. °· Your pain is getting worse and is not helped by medicine. °· You have bleeding or swelling. °· You are still sleepy or feeling clumsy after 24 hours. °SEEK IMMEDIATE MEDICAL CARE IF: °· You develop a rash. °· You have difficulty breathing. °· You develop any type of allergic problem. °· You have a fever. °MAKE SURE YOU: °· Understand these instructions. °· Will watch your condition. °· Will get help right away if you are not doing well or get worse. °Document Released: 11/17/2012 Document Reviewed: 11/17/2012 °ExitCare® Patient Information ©2015 [employer]. This information is not intended to replace advice given to you by your health care provider. Make sure you discuss any questions you have with your health care provider. ° ° °

## 2013-08-17 ENCOUNTER — Other Ambulatory Visit (HOSPITAL_COMMUNITY): Payer: Medicare Other

## 2013-08-17 ENCOUNTER — Other Ambulatory Visit: Payer: Self-pay | Admitting: *Deleted

## 2013-08-17 ENCOUNTER — Inpatient Hospital Stay (HOSPITAL_COMMUNITY): Admission: RE | Admit: 2013-08-17 | Payer: Medicare Other | Source: Ambulatory Visit

## 2013-08-17 ENCOUNTER — Other Ambulatory Visit: Payer: Self-pay | Admitting: Oncology

## 2013-08-22 ENCOUNTER — Ambulatory Visit (HOSPITAL_BASED_OUTPATIENT_CLINIC_OR_DEPARTMENT_OTHER): Payer: Medicare Other

## 2013-08-22 ENCOUNTER — Other Ambulatory Visit (HOSPITAL_BASED_OUTPATIENT_CLINIC_OR_DEPARTMENT_OTHER): Payer: Medicare Other

## 2013-08-22 ENCOUNTER — Other Ambulatory Visit: Payer: Self-pay

## 2013-08-22 VITALS — BP 152/76 | HR 78 | Temp 98.4°F | Resp 18

## 2013-08-22 DIAGNOSIS — C541 Malignant neoplasm of endometrium: Secondary | ICD-10-CM

## 2013-08-22 DIAGNOSIS — C549 Malignant neoplasm of corpus uteri, unspecified: Secondary | ICD-10-CM

## 2013-08-22 DIAGNOSIS — E876 Hypokalemia: Secondary | ICD-10-CM

## 2013-08-22 DIAGNOSIS — Z5111 Encounter for antineoplastic chemotherapy: Secondary | ICD-10-CM

## 2013-08-22 LAB — CBC WITH DIFFERENTIAL/PLATELET
BASO%: 0.3 % (ref 0.0–2.0)
BASOS ABS: 0 10*3/uL (ref 0.0–0.1)
EOS ABS: 0 10*3/uL (ref 0.0–0.5)
EOS%: 0.1 % (ref 0.0–7.0)
HEMATOCRIT: 28.1 % — AB (ref 34.8–46.6)
HEMOGLOBIN: 9.1 g/dL — AB (ref 11.6–15.9)
LYMPH%: 10.5 % — AB (ref 14.0–49.7)
MCH: 32.7 pg (ref 25.1–34.0)
MCHC: 32.4 g/dL (ref 31.5–36.0)
MCV: 100.8 fL (ref 79.5–101.0)
MONO#: 0.1 10*3/uL (ref 0.1–0.9)
MONO%: 1.2 % (ref 0.0–14.0)
NEUT%: 87.9 % — AB (ref 38.4–76.8)
NEUTROS ABS: 3.9 10*3/uL (ref 1.5–6.5)
Platelets: 266 10*3/uL (ref 145–400)
RBC: 2.79 10*6/uL — ABNORMAL LOW (ref 3.70–5.45)
RDW: 13.4 % (ref 11.2–14.5)
WBC: 4.4 10*3/uL (ref 3.9–10.3)
lymph#: 0.5 10*3/uL — ABNORMAL LOW (ref 0.9–3.3)

## 2013-08-22 LAB — COMPREHENSIVE METABOLIC PANEL (CC13)
ALT: 7 U/L (ref 0–55)
AST: 11 U/L (ref 5–34)
Albumin: 3.6 g/dL (ref 3.5–5.0)
Alkaline Phosphatase: 52 U/L (ref 40–150)
Anion Gap: 11 mEq/L (ref 3–11)
BUN: 15.1 mg/dL (ref 7.0–26.0)
CO2: 22 mEq/L (ref 22–29)
Calcium: 9.1 mg/dL (ref 8.4–10.4)
Chloride: 108 mEq/L (ref 98–109)
Creatinine: 1 mg/dL (ref 0.6–1.1)
Glucose: 174 mg/dl — ABNORMAL HIGH (ref 70–140)
Potassium: 4.7 mEq/L (ref 3.5–5.1)
Sodium: 140 mEq/L (ref 136–145)
Total Bilirubin: 0.31 mg/dL (ref 0.20–1.20)
Total Protein: 6.6 g/dL (ref 6.4–8.3)

## 2013-08-22 MED ORDER — FAMOTIDINE IN NACL 20-0.9 MG/50ML-% IV SOLN
20.0000 mg | Freq: Once | INTRAVENOUS | Status: AC
  Administered 2013-08-22: 20 mg via INTRAVENOUS

## 2013-08-22 MED ORDER — DEXAMETHASONE SODIUM PHOSPHATE 20 MG/5ML IJ SOLN
20.0000 mg | Freq: Once | INTRAMUSCULAR | Status: AC
  Administered 2013-08-22: 20 mg via INTRAVENOUS

## 2013-08-22 MED ORDER — DEXTROSE 5 % IV SOLN
175.0000 mg/m2 | Freq: Once | INTRAVENOUS | Status: AC
  Administered 2013-08-22: 288 mg via INTRAVENOUS
  Filled 2013-08-22: qty 48

## 2013-08-22 MED ORDER — DEXAMETHASONE SODIUM PHOSPHATE 20 MG/5ML IJ SOLN
INTRAMUSCULAR | Status: AC
  Filled 2013-08-22: qty 5

## 2013-08-22 MED ORDER — LIDOCAINE-PRILOCAINE 2.5-2.5 % EX CREA: 1.0000 "application " | TOPICAL_CREAM | CUTANEOUS | Status: DC | PRN

## 2013-08-22 MED ORDER — SODIUM CHLORIDE 0.9 % IV SOLN
279.2000 mg | Freq: Once | INTRAVENOUS | Status: AC
  Administered 2013-08-22: 280 mg via INTRAVENOUS
  Filled 2013-08-22: qty 28

## 2013-08-22 MED ORDER — LIDOCAINE-PRILOCAINE 2.5-2.5 % EX CREA
TOPICAL_CREAM | CUTANEOUS | Status: AC
  Filled 2013-08-22: qty 5

## 2013-08-22 MED ORDER — ONDANSETRON 16 MG/50ML IVPB (CHCC)
16.0000 mg | Freq: Once | INTRAVENOUS | Status: AC
  Administered 2013-08-22: 16 mg via INTRAVENOUS

## 2013-08-22 MED ORDER — HEPARIN SOD (PORK) LOCK FLUSH 100 UNIT/ML IV SOLN
500.0000 [IU] | Freq: Once | INTRAVENOUS | Status: AC | PRN
  Administered 2013-08-22: 500 [IU]
  Filled 2013-08-22: qty 5

## 2013-08-22 MED ORDER — ONDANSETRON 16 MG/50ML IVPB (CHCC)
INTRAVENOUS | Status: AC
  Filled 2013-08-22: qty 16

## 2013-08-22 MED ORDER — DIPHENHYDRAMINE HCL 50 MG/ML IJ SOLN
INTRAMUSCULAR | Status: AC
  Filled 2013-08-22: qty 1

## 2013-08-22 MED ORDER — SODIUM CHLORIDE 0.9 % IV SOLN
Freq: Once | INTRAVENOUS | Status: AC
  Administered 2013-08-22: 13:00:00 via INTRAVENOUS

## 2013-08-22 MED ORDER — FAMOTIDINE IN NACL 20-0.9 MG/50ML-% IV SOLN
INTRAVENOUS | Status: AC
  Filled 2013-08-22: qty 50

## 2013-08-22 MED ORDER — SODIUM CHLORIDE 0.9 % IJ SOLN
10.0000 mL | INTRAMUSCULAR | Status: DC | PRN
  Administered 2013-08-22: 10 mL
  Filled 2013-08-22: qty 10

## 2013-08-22 MED ORDER — DIPHENHYDRAMINE HCL 50 MG/ML IJ SOLN
25.0000 mg | Freq: Once | INTRAMUSCULAR | Status: AC
  Administered 2013-08-22: 25 mg via INTRAVENOUS

## 2013-08-22 NOTE — Patient Instructions (Signed)
[facility] Discharge Instructions for Patients Receiving Chemotherapy  Today you received the following chemotherapy agents: Taxol and Carboplatin.  To help prevent nausea and vomiting after your treatment, we encourage you to take your nausea medication as prescribed.   If you develop nausea and vomiting that is not controlled by your nausea medication, call the clinic.   BELOW ARE SYMPTOMS THAT SHOULD BE REPORTED IMMEDIATELY:  *FEVER GREATER THAN 100.5 F  *CHILLS WITH OR WITHOUT FEVER  NAUSEA AND VOMITING THAT IS NOT CONTROLLED WITH YOUR NAUSEA MEDICATION  *UNUSUAL SHORTNESS OF BREATH  *UNUSUAL BRUISING OR BLEEDING  TENDERNESS IN MOUTH AND THROAT WITH OR WITHOUT PRESENCE OF ULCERS  *URINARY PROBLEMS  *BOWEL PROBLEMS  UNUSUAL RASH Items with * indicate a potential emergency and should be followed up as soon as possible.  Feel free to call the clinic you have any questions or concerns. The clinic phone number is [phone].    

## 2013-08-22 NOTE — Telephone Encounter (Signed)
Call rcvd from [facility] infusion RN - pt has new port and needs scrip for EMLA cream.  Prescription sent- receipt confirmed by pharmacy.  Lanelle Bal will inform patient.

## 2013-08-23 ENCOUNTER — Ambulatory Visit (HOSPITAL_BASED_OUTPATIENT_CLINIC_OR_DEPARTMENT_OTHER): Payer: Medicare Other

## 2013-08-23 VITALS — BP 133/66 | HR 75 | Temp 98.3°F

## 2013-08-23 DIAGNOSIS — C541 Malignant neoplasm of endometrium: Secondary | ICD-10-CM

## 2013-08-23 DIAGNOSIS — C549 Malignant neoplasm of corpus uteri, unspecified: Secondary | ICD-10-CM

## 2013-08-23 DIAGNOSIS — Z5189 Encounter for other specified aftercare: Secondary | ICD-10-CM

## 2013-08-23 MED ORDER — TBO-FILGRASTIM 300 MCG/0.5ML ~~LOC~~ SOSY
300.0000 ug | PREFILLED_SYRINGE | Freq: Once | SUBCUTANEOUS | Status: AC
  Administered 2013-08-23: 300 ug via SUBCUTANEOUS
  Filled 2013-08-23: qty 0.5

## 2013-08-23 NOTE — Patient Instructions (Signed)
Tbo-Filgrastim injection What is this medicine? TBO-FILGRASTIM is used to help decrease the time you have low amounts of white blood cells after cancer treatment. It helps the body make more white blood cells. Increasing the amount of white blood cells helps to decrease the risk of infection and fever. This medicine may be used for other purposes; ask your health care provider or pharmacist if you have questions. COMMON BRAND NAME(S): Granix What should I tell my health care provider before I take this medicine? They need to know if you have any of these conditions: -history of blood diseases, like sickle cell anemia or leukemia -an unusual or allergic reaction to tbo-filgrastim, filgrastim, pegfilgrastim, other medicines, foods, dyes, or preservatives -pregnant or trying to get pregnant -breast-feeding How should I use this medicine? This medicine is for injection under the skin. It is given by a health care professional in a hospital or clinic setting. Talk to your pediatrician regarding the use of this medicine in children. Special care may be needed. Overdosage: If you think you've taken too much of this medicine contact a poison control center or emergency room at once. Overdosage: If you think you have taken too much of this medicine contact a poison control center or emergency room at once. NOTE: This medicine is only for you. Do not share this medicine with others. What if I miss a dose? Keep appointments for follow-up doses as directed. It is important not to miss your dose. Call your doctor or health care professional if you are unable to keep an appointment. What may interact with this medicine? -lithium This list may not describe all possible interactions. Give your health care provider a list of all the medicines, herbs, non-prescription drugs, or dietary supplements you use. Also tell them if you smoke, drink alcohol, or use illegal drugs. Some items may interact with your  medicine. What should I watch for while using this medicine? Your condition will be monitored carefully while you are receiving this medicine. You may need blood work done while you are taking this medicine. Avoid taking products that contain aspirin, acetaminophen, ibuprofen, naproxen, or ketoprofen unless instructed by your doctor. These medicines may hide a fever. Call your doctor or health care professional for advice if you get a fever, chills or sore throat, or other symptoms of a cold or flu. Do not treat yourself. What side effects may I notice from receiving this medicine? Side effects that you should report to your doctor or health care professional as soon as possible: -allergic reactions like skin rash, itching or hives, swelling of the face, lips, or tongue -breathing problems -fever -stomach pain Side effects that usually do not require medical attention (Report these to your doctor or health care professional if they continue or are bothersome.): -bone pain This list may not describe all possible side effects. Call your doctor for medical advice about side effects. You may report side effects to FDA at 1-800-FDA-1088. Where should I keep my medicine? This drug is given in a hospital or clinic and will not be stored at home. NOTE: This sheet is a summary. It may not cover all possible information. If you have questions about this medicine, talk to your doctor, pharmacist, or health care provider.  2015, Elsevier/Gold Standard. (2010-10-16 16:41:24)  

## 2013-08-24 ENCOUNTER — Ambulatory Visit (HOSPITAL_BASED_OUTPATIENT_CLINIC_OR_DEPARTMENT_OTHER): Payer: Medicare Other

## 2013-08-24 VITALS — BP 128/66 | HR 78 | Temp 98.9°F

## 2013-08-24 DIAGNOSIS — C541 Malignant neoplasm of endometrium: Secondary | ICD-10-CM

## 2013-08-24 DIAGNOSIS — Z5189 Encounter for other specified aftercare: Secondary | ICD-10-CM

## 2013-08-24 DIAGNOSIS — C549 Malignant neoplasm of corpus uteri, unspecified: Secondary | ICD-10-CM

## 2013-08-24 MED ORDER — TBO-FILGRASTIM 300 MCG/0.5ML ~~LOC~~ SOSY
300.0000 ug | PREFILLED_SYRINGE | Freq: Once | SUBCUTANEOUS | Status: AC
Start: 2013-08-24 — End: 2013-08-24
  Administered 2013-08-24: 300 ug via SUBCUTANEOUS
  Filled 2013-08-24: qty 0.5

## 2013-08-25 ENCOUNTER — Ambulatory Visit (HOSPITAL_BASED_OUTPATIENT_CLINIC_OR_DEPARTMENT_OTHER): Payer: Medicare Other

## 2013-08-25 VITALS — BP 132/70 | HR 85 | Temp 98.4°F

## 2013-08-25 DIAGNOSIS — Z5189 Encounter for other specified aftercare: Secondary | ICD-10-CM

## 2013-08-25 DIAGNOSIS — C541 Malignant neoplasm of endometrium: Secondary | ICD-10-CM

## 2013-08-25 DIAGNOSIS — C549 Malignant neoplasm of corpus uteri, unspecified: Secondary | ICD-10-CM

## 2013-08-25 MED ORDER — TBO-FILGRASTIM 300 MCG/0.5ML ~~LOC~~ SOSY
300.0000 ug | PREFILLED_SYRINGE | Freq: Once | SUBCUTANEOUS | Status: AC
  Administered 2013-08-25: 300 ug via SUBCUTANEOUS
  Filled 2013-08-25: qty 0.5

## 2013-08-28 ENCOUNTER — Other Ambulatory Visit: Payer: Self-pay | Admitting: Oncology

## 2013-08-28 DIAGNOSIS — C541 Malignant neoplasm of endometrium: Secondary | ICD-10-CM

## 2013-08-29 ENCOUNTER — Other Ambulatory Visit (HOSPITAL_BASED_OUTPATIENT_CLINIC_OR_DEPARTMENT_OTHER): Payer: Medicare Other

## 2013-08-29 ENCOUNTER — Telehealth: Payer: Self-pay | Admitting: *Deleted

## 2013-08-29 ENCOUNTER — Encounter: Payer: Self-pay | Admitting: Oncology

## 2013-08-29 ENCOUNTER — Ambulatory Visit (HOSPITAL_BASED_OUTPATIENT_CLINIC_OR_DEPARTMENT_OTHER): Payer: Medicare Other | Admitting: Oncology

## 2013-08-29 ENCOUNTER — Telehealth: Payer: Self-pay | Admitting: Oncology

## 2013-08-29 VITALS — BP 143/64 | HR 89 | Temp 98.2°F | Resp 18 | Ht 62.0 in | Wt 121.2 lb

## 2013-08-29 DIAGNOSIS — G629 Polyneuropathy, unspecified: Secondary | ICD-10-CM

## 2013-08-29 DIAGNOSIS — D6481 Anemia due to antineoplastic chemotherapy: Secondary | ICD-10-CM

## 2013-08-29 DIAGNOSIS — Z5189 Encounter for other specified aftercare: Secondary | ICD-10-CM

## 2013-08-29 DIAGNOSIS — E876 Hypokalemia: Secondary | ICD-10-CM

## 2013-08-29 DIAGNOSIS — G609 Hereditary and idiopathic neuropathy, unspecified: Secondary | ICD-10-CM

## 2013-08-29 DIAGNOSIS — C541 Malignant neoplasm of endometrium: Secondary | ICD-10-CM

## 2013-08-29 DIAGNOSIS — T451X5A Adverse effect of antineoplastic and immunosuppressive drugs, initial encounter: Secondary | ICD-10-CM

## 2013-08-29 DIAGNOSIS — C549 Malignant neoplasm of corpus uteri, unspecified: Secondary | ICD-10-CM

## 2013-08-29 LAB — CBC WITH DIFFERENTIAL/PLATELET
BASO%: 1.1 % (ref 0.0–2.0)
Basophils Absolute: 0 10*3/uL (ref 0.0–0.1)
EOS%: 5.5 % (ref 0.0–7.0)
Eosinophils Absolute: 0.2 10*3/uL (ref 0.0–0.5)
HCT: 26.2 % — ABNORMAL LOW (ref 34.8–46.6)
HGB: 8.5 g/dL — ABNORMAL LOW (ref 11.6–15.9)
LYMPH%: 19.2 % (ref 14.0–49.7)
MCH: 32.5 pg (ref 25.1–34.0)
MCHC: 32.6 g/dL (ref 31.5–36.0)
MCV: 99.8 fL (ref 79.5–101.0)
MONO#: 0.6 10*3/uL (ref 0.1–0.9)
MONO%: 20.8 % — ABNORMAL HIGH (ref 0.0–14.0)
NEUT%: 53.4 % (ref 38.4–76.8)
NEUTROS ABS: 1.5 10*3/uL (ref 1.5–6.5)
PLATELETS: 201 10*3/uL (ref 145–400)
RBC: 2.62 10*6/uL — AB (ref 3.70–5.45)
RDW: 13.1 % (ref 11.2–14.5)
WBC: 2.8 10*3/uL — AB (ref 3.9–10.3)
lymph#: 0.5 10*3/uL — ABNORMAL LOW (ref 0.9–3.3)

## 2013-08-29 LAB — COMPREHENSIVE METABOLIC PANEL (CC13)
ALBUMIN: 3.7 g/dL (ref 3.5–5.0)
ALT: 9 U/L (ref 0–55)
AST: 15 U/L (ref 5–34)
Alkaline Phosphatase: 55 U/L (ref 40–150)
Anion Gap: 10 mEq/L (ref 3–11)
BUN: 12.4 mg/dL (ref 7.0–26.0)
CO2: 25 meq/L (ref 22–29)
Calcium: 8.6 mg/dL (ref 8.4–10.4)
Chloride: 108 mEq/L (ref 98–109)
Creatinine: 1.1 mg/dL (ref 0.6–1.1)
Glucose: 138 mg/dl (ref 70–140)
POTASSIUM: 3.8 meq/L (ref 3.5–5.1)
SODIUM: 142 meq/L (ref 136–145)
TOTAL PROTEIN: 6.5 g/dL (ref 6.4–8.3)
Total Bilirubin: 0.25 mg/dL (ref 0.20–1.20)

## 2013-08-29 MED ORDER — VITAMIN B-6 100 MG PO TABS: 100.0000 mg | ORAL_TABLET | Freq: Two times a day (BID) | ORAL | Status: DC

## 2013-08-29 MED ORDER — TBO-FILGRASTIM 300 MCG/0.5ML ~~LOC~~ SOSY
300.0000 ug | PREFILLED_SYRINGE | Freq: Once | SUBCUTANEOUS | Status: AC
  Administered 2013-08-29: 300 ug via SUBCUTANEOUS
  Filled 2013-08-29: qty 0.5

## 2013-08-29 MED ORDER — FILGRASTIM 300 MCG/0.5ML IJ SOLN: 300.0000 ug | Freq: Once | INTRAMUSCULAR | Status: DC

## 2013-08-29 NOTE — Telephone Encounter (Signed)
Message copied by Christa See on Mon Aug 29, 2013  4:18 PM ------      Message from: Gordy Levan      Created: Mon Aug 29, 2013  1:49 PM       Please ask pharmacist to be sure she get B6 (pyridoxine) 100 mg bid for neuropathy.  OTC is fine, just give her the correct med ------

## 2013-08-29 NOTE — Progress Notes (Signed)
OFFICE PROGRESS NOTE   08/29/2013   Physicians:Daniel ClarkePearson, [name] F (PCP [facility]), Gery Pray, Elisabeth Pigeon (ophth)   INTERVAL HISTORY:  Patient is seen, together with husband, having resumed adjuvant chemotherapy for IIIC2 high grade serous endometrial carcinoma, with cycle 4 carboplatin taxol on 08-22-13 after break for radiation. She had [name] placed by IR prior to most recent chemo, functioned without difficulty using ice to numb prior to access.  She had granix 300 on 7-14,15,16 and will have this again today. Plan is 6 total cycles of chemotherapy. She is to see Dr Sondra Come 7-27 and Dr Josephina Shih 7-31.  Patient tolerated most recent chemotherapy well overall, tho she notices some fatigue. [name] is still slightly sore from placement, improving. No nausea, is eating and drinking fluids. Bowels are moving. She complains of numbness in toes bilaterally which is noticeable when she walks, per patient "after RT and before this chemotherapy", tho most likely is taxol related. No neuropathy in fingers. Taxol has been at 175 mg/m2 for first 4 cycles chemo.  [name] in.   ONCOLOGIC HISTORY Patient presented to PCP with vaginal discharge with some bleeding since Nov 2014. 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 MRI pelvis 01-20-13 Had uterine mass and prominent bilateral iliac nodes. She had TAH BSO and pelvic and para-aortic lymphadenectomy by Dr Josephina Shih on 03-01-2013. Pathology [phone]) had high grade serous carcinoma of endometrium extending to outer half of myometrium, with extensive angiolymphatic invasion, involvement of right ovary, involvement of 2/9 pelvic nodes and 3/4 para-aortic nodes. At completion of surgery there was no gross residual disease. She had 3 cycles of taxol carboplatin from 03-28-13 thru 05-09-2013, with neupogen support. She had pelvic RT 45 Gy  thru ~ 07-05-13 and HDR x4 thru 07-27-13. Chemo resumed with taxol carboplatin cycle 4 on 08-22-13, with neupogen support.  Review of systems as above, also: No fever or symptoms of infection. No abdominal or pelvic pain. No LE swelling. No bleeding. Remainder of 10 point Review of Systems negative.  Objective:  Vital signs in last 24 hours:  BP 143/64  Pulse 89  Temp(Src) 98.2 F (36.8 C) (Oral)  Resp 18  Ht 5\' 2"  (1.575 m)  Wt 121 lb 3.2 oz (54.976 kg)  BMI 22.16 kg/m2  Weight is up 1 lb.  Alert, oriented and appropriate, patient and husband both pleasant today. Ambulatory without  difficulty.   HEENT:PERRL, sclerae not icteric. Oral mucosa moist without lesions, posterior pharynx clear.  Neck supple. No JVD.  Lymphatics:no cervical,suraclavicular, axillary or inguinal adenopathy Resp: clear to auscultation bilaterally and normal percussion bilaterally Cardio: regular rate and rhythm. No gallop. GI: soft, nontender, not distended, no mass or organomegaly. Normally active bowel sounds. Surgical incision not remarkable. Musculoskeletal/ Extremities: without pitting edema, cords, tenderness Neuro: peripheral neuropathy as noted toes bilaterally. Otherwise nonfocal Skin without rash, ecchymosis, petechiae Portacath-without erythema or ecchymosis, not tender  Lab Results:  Results for orders placed in visit on 08/29/13  CBC WITH DIFFERENTIAL      Result Value Ref Range   WBC 2.8 (*) 3.9 - 10.3 10e3/uL   NEUT# 1.5  1.5 - 6.5 10e3/uL   HGB 8.5 (*) 11.6 - 15.9 g/dL   HCT 26.2 (*) 34.8 - 46.6 %   Platelets 201  145 - 400 10e3/uL   MCV 99.8  79.5 - 101.0 fL   MCH 32.5  25.1 - 34.0 pg   MCHC  32.6  31.5 - 36.0 g/dL   RBC 2.62 (*) 3.70 - 5.45 10e6/uL   RDW 13.1  11.2 - 14.5 %   lymph# 0.5 (*) 0.9 - 3.3 10e3/uL   MONO# 0.6  0.1 - 0.9 10e3/uL   Eosinophils Absolute 0.2  0.0 - 0.5 10e3/uL   Basophils Absolute 0.0  0.0 - 0.1 10e3/uL   NEUT% 53.4  38.4 - 76.8 %   LYMPH% 19.2  14.0 -  49.7 %   MONO% 20.8 (*) 0.0 - 14.0 %   EOS% 5.5  0.0 - 7.0 %   BASO% 1.1  0.0 - 2.0 %  COMPREHENSIVE METABOLIC PANEL (DX83)      Result Value Ref Range   Sodium 142  136 - 145 mEq/L   Potassium 3.8  3.5 - 5.1 mEq/L   Chloride 108  98 - 109 mEq/L   CO2 25  22 - 29 mEq/L   Glucose 138  70 - 140 mg/dl   BUN 12.4  7.0 - 26.0 mg/dL   Creatinine 1.1  0.6 - 1.1 mg/dL   Total Bilirubin 0.25  0.20 - 1.20 mg/dL   Alkaline Phosphatase 55  40 - 150 U/L   AST 15  5 - 34 U/L   ALT 9  0 - 55 U/L   Total Protein 6.5  6.4 - 8.3 g/dL   Albumin 3.7  3.5 - 5.0 g/dL   Calcium 8.6  8.4 - 10.4 mg/dL   Anion Gap 10  3 - 11 mEq/L     Studies/Results:  No results found.  Medications: I have reviewed the patient's current medications. Additional neupogen given today, for total 4. She will begin B6 for the peripheral neuropathy.  DISCUSSION: reviewed possible neuropathy side effects of taxol and encouraged massage of feet and B6. Discussed Hgb as above, related to cumulative chemo + recent RT; patient to let us know if more symptoms of anemia.  Chemo orders entered for cycle 5 Aug 3, with taxol dose reduced to 135 mg/m2, which will be used as long as neuropathy in feet is not more bothersome than now. IF NEUROPATHY IN FEET MORE OF CONCERN at time of that treatment, ok to hold taxol and give carboplatin only. GIve gCSF as planned even if taxol held.   Assessment/Plan:  1.IIIC2 high grade serous endometrial carcinoma: surgery 03-01-13, 3 cycles of taxol carboplatin 03-28-13 thru 05-09-13 with gCSF required, pelvic RT + HDR by Dr Sondra Come completed 07-27-13, now additional 3 cycles chemo. She will will see APP on ~ July 31 and will be treated with cycle 5 on 09-12-13 as long as ANC >=1.5 and plt >=100k and chemistries ok. SEE ABOVE re taxol depending on neuropathy at time of APP visit. She will have granix 8-4,5,6. I will see her with labs ~ 8-10. 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 SEE ABOVE. 3.[name]  in. Very high risk disease, so would be best to keep [name] in until we see how disease responds after this chemotherapy completes.  4.RT diarrhea with severe dehydration/ ARF/ hypoK and hypoMg: resolved with hospitalization in June 5.anemia: related to chemo and RT, as above  6.no mammograms in years, no colonoscoppy 7.glaucoma   Patient and husband seemed to understand the major issues and had no further questions at completion of visit. They are encouraged to call if concerns prior to next scheduled visits. Chemo and granix orders entered now for Aug 3. SEE ABOVE if taxol needs to hold that treatment.  Time spent  30 min including >50% counseling and coordination of care   Marialena Wollen P, MD   08/29/2013, 1:43 PM

## 2013-08-29 NOTE — Telephone Encounter (Signed)
gv and printed appt sched adn avs for pt for July adn Aug....sed added tx. °

## 2013-08-29 NOTE — Patient Instructions (Signed)
Please start Vitamin B6 100 mg twice daily, as this might help neuropathy in feet. Also massage feet several times daily and wear comfortable shoes. Dr Marko Plume will decrease amount of the chemo medicine taxol, as this can aggravate neuropathy.

## 2013-08-29 NOTE — Telephone Encounter (Signed)
Spoke with pharmacist - they will fill prescription and call patient when ready to pick-up.

## 2013-09-02 ENCOUNTER — Encounter: Payer: Self-pay | Admitting: Oncology

## 2013-09-05 ENCOUNTER — Ambulatory Visit
Admission: RE | Admit: 2013-09-05 | Discharge: 2013-09-05 | Disposition: A | Discharge status: Home / Self Care | Payer: Medicare Other | Source: Ambulatory Visit | Attending: Radiation Oncology | Admitting: Radiation Oncology

## 2013-09-05 ENCOUNTER — Encounter: Payer: Self-pay | Admitting: Radiation Oncology

## 2013-09-05 VITALS — BP 148/74 | HR 78 | Temp 98.5°F | Ht 62.0 in | Wt 124.6 lb

## 2013-09-05 DIAGNOSIS — C541 Malignant neoplasm of endometrium: Secondary | ICD-10-CM

## 2013-09-05 NOTE — Progress Notes (Signed)
Radiation Oncology         [phone] ________________________________  Name: Ebony Herring MRN: [id number]  Date: 09/05/2013  DOB: Feb 23, 1931  Follow-Up Visit Note  CC: [name], MD  Gordy Levan, MD  Diagnosis:   Endometrial cancer   Primary site: Corpus Uteri - Carcinoma   Staging method: AJCC 7th Edition   Clinical: Stage IIIC2 (T3a, N2, M0)   Pathologic: Stage IIIC2 (T3a, N2, M0)   Summary: Stage IIIC2 (T3a, N2, M0)   Interval Since Last Radiation:  1  months  Narrative:  The patient returns today for routine follow-up.  She continues to have fatigue but otherwise is doing well. She has 2 more cycles of chemotherapy remaining to complete her adjuvant treatment. She denies any vaginal bleeding pelvic pain or urination difficulties. She denies any loose bowels or diarrhea.                              ALLERGIES:  is allergic to bactrim and ciprofloxacin.  Meds: Current Outpatient Prescriptions  Medication Sig Dispense Refill  . Brinzolamide-Brimonidine (SIMBRINZA) 1-0.2 % SUSP Place 1 drop into both eyes 2 (two) times daily.       Marland Kitchen dexamethasone (DECADRON) 4 MG tablet Take 5 tabs = 20 mg with food 12 hours and 6 hours prior to Taxol chemotherapy.  10 tablet  2  . ferrous fumarate (HEMOCYTE - 106 MG FE) 325 (106 FE) MG TABS tablet Take 1 tablet by mouth daily.      Marland Kitchen lidocaine-prilocaine (EMLA) cream Apply 1 application topically as needed.  30 g  0  . pantoprazole (PROTONIX) 40 MG tablet Take 1 tablet (40 mg total) by mouth daily.  30 tablet  1  . pyridOXINE (VITAMIN B-6) 100 MG tablet Take 1 tablet (100 mg total) by mouth 2 (two) times daily.  100 tablet  1  . loperamide (IMODIUM) 2 MG capsule       . LORazepam (ATIVAN) 0.5 MG tablet Take 1 tablet under the tongue or swallow every 6 hours as needed for nausea  20 tablet  0  . ondansetron (ZOFRAN) 8 MG tablet Take 1-2 tablets (8-16 mg total) by mouth every 12 (twelve) hours as needed for nausea or vomiting.  20  tablet  1   No current facility-administered medications for this encounter.    Physical Findings: The patient is in no acute distress. Patient is alert and oriented.  height is 5\' 2"  (1.575 m) and weight is 124 lb 9.6 oz (56.518 kg). Her oral temperature is 98.5 F (36.9 C). Her blood pressure is 148/74 and her pulse is 78. Marland Kitchen  No palpable supraclavicular or axillary adenopathy. The lungs are clear to auscultation. Heart has regular rhythm and rate. The abdomen is soft and nontender with normal bowel sounds. A pelvic exam is not performed in light of the patient's recent completion of treatment.  Lab Findings: Lab Results  Component Value Date   WBC 2.8* 08/29/2013   HGB 8.5* 08/29/2013   HCT 26.2* 08/29/2013   MCV 99.8 08/29/2013   PLT 201 08/29/2013     Impression:  The patient is recovering from the effects of radiation.  Fatigue is her only issue at this time.  Plan:  Routine followup in 3 months. Patient will be seen by gynecologic oncology later this month. Today the patient was given a vaginal dilator and instructions on its use in light of her pelvic  radiation therapy.  ____________________________________ Blair Promise, MD

## 2013-09-05 NOTE — Progress Notes (Signed)
Patient given a size medium dilator and instructed to apply water soluble lubricant and insert into vaginal vault as far as comfortable and leave in 10 to 15 minutes three times weekly (Monday,Wednesday and Friday).

## 2013-09-05 NOTE — Progress Notes (Signed)
Ebony Herring here for follow up after treatment for endometrial cancer.  She denies pain, nausea, rectal/vaginal bleeding, skin irritation and diarrhea.  She reports fatigue. She had her last chemotherapy treatment 2 weeks ago and has two more treatments left.  She reports having numbness in her toes and is taking vitamin B 6 which is helping.

## 2013-09-09 ENCOUNTER — Other Ambulatory Visit (HOSPITAL_BASED_OUTPATIENT_CLINIC_OR_DEPARTMENT_OTHER): Payer: Medicare Other

## 2013-09-09 ENCOUNTER — Encounter: Payer: Self-pay | Admitting: Gynecology

## 2013-09-09 ENCOUNTER — Ambulatory Visit: Payer: Medicare Other | Attending: Gynecology | Admitting: Gynecology

## 2013-09-09 ENCOUNTER — Other Ambulatory Visit: Payer: Self-pay | Admitting: *Deleted

## 2013-09-09 ENCOUNTER — Encounter: Payer: Self-pay | Admitting: Adult Health

## 2013-09-09 ENCOUNTER — Ambulatory Visit (HOSPITAL_BASED_OUTPATIENT_CLINIC_OR_DEPARTMENT_OTHER): Payer: Medicare Other | Admitting: Adult Health

## 2013-09-09 VITALS — BP 141/75 | HR 78 | Temp 98.9°F | Resp 18 | Ht 62.0 in | Wt 121.8 lb

## 2013-09-09 DIAGNOSIS — D6481 Anemia due to antineoplastic chemotherapy: Secondary | ICD-10-CM

## 2013-09-09 DIAGNOSIS — G5790 Unspecified mononeuropathy of unspecified lower limb: Secondary | ICD-10-CM

## 2013-09-09 DIAGNOSIS — C541 Malignant neoplasm of endometrium: Secondary | ICD-10-CM

## 2013-09-09 DIAGNOSIS — D709 Neutropenia, unspecified: Secondary | ICD-10-CM

## 2013-09-09 DIAGNOSIS — C549 Malignant neoplasm of corpus uteri, unspecified: Secondary | ICD-10-CM | POA: Diagnosis not present

## 2013-09-09 DIAGNOSIS — Z79899 Other long term (current) drug therapy: Secondary | ICD-10-CM | POA: Insufficient documentation

## 2013-09-09 DIAGNOSIS — T451X5A Adverse effect of antineoplastic and immunosuppressive drugs, initial encounter: Secondary | ICD-10-CM

## 2013-09-09 DIAGNOSIS — Z803 Family history of malignant neoplasm of breast: Secondary | ICD-10-CM | POA: Insufficient documentation

## 2013-09-09 LAB — CBC WITH DIFFERENTIAL/PLATELET
BASO%: 0.7 % (ref 0.0–2.0)
Basophils Absolute: 0 10*3/uL (ref 0.0–0.1)
EOS%: 0.7 % (ref 0.0–7.0)
Eosinophils Absolute: 0 10*3/uL (ref 0.0–0.5)
HCT: 27 % — ABNORMAL LOW (ref 34.8–46.6)
HGB: 8.7 g/dL — ABNORMAL LOW (ref 11.6–15.9)
LYMPH%: 13.2 % — AB (ref 14.0–49.7)
MCH: 32.4 pg (ref 25.1–34.0)
MCHC: 32.2 g/dL (ref 31.5–36.0)
MCV: 100.5 fL (ref 79.5–101.0)
MONO#: 0.3 10*3/uL (ref 0.1–0.9)
MONO%: 6.5 % (ref 0.0–14.0)
NEUT#: 4 10*3/uL (ref 1.5–6.5)
NEUT%: 78.9 % — ABNORMAL HIGH (ref 38.4–76.8)
PLATELETS: 212 10*3/uL (ref 145–400)
RBC: 2.69 10*6/uL — AB (ref 3.70–5.45)
RDW: 14.2 % (ref 11.2–14.5)
WBC: 5 10*3/uL (ref 3.9–10.3)
lymph#: 0.7 10*3/uL — ABNORMAL LOW (ref 0.9–3.3)

## 2013-09-09 LAB — COMPREHENSIVE METABOLIC PANEL (CC13)
ALT: 8 U/L (ref 0–55)
ANION GAP: 11 meq/L (ref 3–11)
AST: 17 U/L (ref 5–34)
Albumin: 3.8 g/dL (ref 3.5–5.0)
Alkaline Phosphatase: 56 U/L (ref 40–150)
BILIRUBIN TOTAL: 0.33 mg/dL (ref 0.20–1.20)
BUN: 13 mg/dL (ref 7.0–26.0)
CO2: 25 mEq/L (ref 22–29)
Calcium: 9 mg/dL (ref 8.4–10.4)
Chloride: 106 mEq/L (ref 98–109)
Creatinine: 1 mg/dL (ref 0.6–1.1)
Glucose: 178 mg/dl — ABNORMAL HIGH (ref 70–140)
Potassium: 3.9 mEq/L (ref 3.5–5.1)
Sodium: 141 mEq/L (ref 136–145)
Total Protein: 6.6 g/dL (ref 6.4–8.3)

## 2013-09-09 NOTE — Progress Notes (Signed)
Consult Note: Gyn-Onc   Ebony Herring 80 y.o. female  Chief Complaint  Patient presents with  . Endometrial Adenocarcinoma    Assessment : Stage III C. poorly differentiated endometrial carcinoma. Ongoing adjuvant therapy mild peripheral neuropathy in the feet.  Plan:  The patient will complete all 6 cycles of carboplatin and Taxol. Approximately a month afterwards we will obtain a CT scan of the chest abdomen and pelvis.  She will continue taking vitamin B6 her her peripheral neuropathy.  I will see the patient back in approximately 2 months.  Interval history: The patient is now completed 4 cycles of carboplatin and Taxol and and  extended field radiation therapy. She'll receive cycle 5 of chemotherapy next week. Her only complaint is that of fatigue and a mild perforata the her feet. She's using vitamin B6 twice a day. She has a good appetite is no other GI or GU symptoms. She denies any pelvic pain pressure vaginal bleeding or discharge.  HPI: 80 year old white married female seen in consultation at the request of Dr. Gaetano Herring regarding the management of a newly diagnosed high-grade endometrial carcinoma. The patient's history dates back to approximately November when she developed of vaginal discharge. Overtime some blood was noted in the discharge. Ultimately the patient was referred to Dr. Gertie Herring for further evaluation. An endometrial biopsy was obtained on 01/26/2013 showed a high-grade endometrial carcinoma with serous features. Subsequently the patient's had an MRI of the pelvis showing a fibroid uterus and prominent bilateral iliac lymph nodes measuring between 1 cm and 7 mm. Currently the patient is having minimal bleeding and no significant pain.  Past gynecologic history includes a bilateral tubal ligation. Obstetrical history gravida 2.  Patient underwent exploratory laparotomy total abdominal hysterectomy bilateral salpingo-oophorectomy pelvic and para-aortic lymphadenectomy on  03/01/2013. Final pathology showed a invasive high-grade serous carcinoma with extensive lymphatic invasion, metastasis to the right ovary. 2 of 9 pelvic lymph nodes and 3 of 4 para-aortic lymph nodes were positive  . "Sandwich" therapy using carboplatin and Taxol and extended field radiation therapy was recommended.    Review of Systems:10 point review of systems is negative except as noted in interval history.   Vitals: Blood pressure 141/75, pulse 78, temperature 98.9 F (37.2 C), temperature source Oral, resp. rate 18, height 5\' 2"  (1.575 m), weight 121 lb 12.8 oz (55.248 kg).  Physical Exam: General : The patient is a healthy woman in no acute distress.  HEENT: normocephalic, extraoccular movements normal; neck is supple without thyromegally  Lynphnodes: Supraclavicular and inguinal nodes not enlarged  Abdomen: Soft, non-tender, no ascites, no organomegally, no masses, no hernias she has a well-healed scar at McBurney's point and small incisions from her tubal ligation and laparoscopic cholecystectomy, incision is well-healed. Pelvic:  EGBUS: Normal female  Vagina: Normal, no lesions  Urethra and Bladder: Normal, non-tender  Cervix: Surgically absent Uterus: Surgically absent. Bi-manual examination: Non-tender; no adenxal masses or nodularity  Rectal: normal sphincter tone, no masses, no blood  Lower extremities: No edema or varicosities. Normal range of motion      Allergies  Allergen Reactions  . Bactrim [Sulfamethoxazole-Trimethoprim] Other (See Comments)    Made her see things  . Ciprofloxacin Other (See Comments)    Made her see things    Past Medical History  Diagnosis Date  . Glaucoma     High pressure- ?pre glaucoma  . Cancer     ENDOMETRIAL CANCER  . Radiation 06/01/13-07/07/13    45 gray to pelvis and periaortic region  .  History of brachytherapy 07/13/13, 07/20/13, 07/27/13    vaginal cuff boost 18 gray    Past Surgical History  Procedure Laterality Date  .  Cholecystectomy    . Appendectomy    . Abdominal hysterectomy N/A 03/01/2013    Procedure: HYSTERECTOMY ABDOMINAL TOTAL/BILATERAL SALPINGO OOPHORECTOMY/WITH PELVIC AND PARA AORTIC LYMPHADNECTOMY ;  Surgeon: Ebony Chapel, MD;  Location: [facility];  Service: Gynecology;  Laterality: N/A;  . Salpingoophorectomy Bilateral 03/01/2013    Procedure: BILATERAL SALPINGO OOPHORECTOMY;  Surgeon: Ebony Chapel, MD;  Location: [facility];  Service: Gynecology;  Laterality: Bilateral;    Current Outpatient Prescriptions  Medication Sig Dispense Refill  . Brinzolamide-Brimonidine (SIMBRINZA) 1-0.2 % SUSP Place 1 drop into both eyes 2 (two) times daily.       Marland Kitchen dexamethasone (DECADRON) 4 MG tablet Take 5 tabs = 20 mg with food 12 hours and 6 hours prior to Taxol chemotherapy.  10 tablet  2  . ferrous fumarate (HEMOCYTE - 106 MG FE) 325 (106 FE) MG TABS tablet Take 1 tablet by mouth daily.      Marland Kitchen lidocaine-prilocaine (EMLA) cream Apply 1 application topically as needed.  30 g  0  . loperamide (IMODIUM) 2 MG capsule       . LORazepam (ATIVAN) 0.5 MG tablet Take 1 tablet under the tongue or swallow every 6 hours as needed for nausea  20 tablet  0  . ondansetron (ZOFRAN) 8 MG tablet Take 1-2 tablets (8-16 mg total) by mouth every 12 (twelve) hours as needed for nausea or vomiting.  20 tablet  1  . pantoprazole (PROTONIX) 40 MG tablet Take 1 tablet (40 mg total) by mouth daily.  30 tablet  1  . pyridOXINE (VITAMIN B-6) 100 MG tablet Take 1 tablet (100 mg total) by mouth 2 (two) times daily.  100 tablet  1   No current facility-administered medications for this visit.    History   Social History  . Marital Status: Married    Spouse Name: N/A    Number of Children: 1  . Years of Education: N/A   Occupational History  . Not on file.   Social History Main Topics  . Smoking status: Never Smoker   . Smokeless tobacco: Never Used  . Alcohol Use: No  . Drug Use: No  . Sexual Activity: Not on  file   Other Topics Concern  . Not on file   Social History Narrative  . No narrative on file    Family History  Problem Relation Age of Onset  . Breast cancer Maternal Aunt     x 4      [name],Rashada Klontz L, MD 09/09/2013, 11:34 AM

## 2013-09-09 NOTE — Progress Notes (Signed)
OFFICE PROGRESS NOTE   09/09/2013   Physicians:Ebony Herring, Ebony Herring (PCP [place] [facility]), Ebony Herring, Ebony Herring (ophth)   INTERVAL HISTORY:  Patient is seen, together with husband, having resumed adjuvant chemotherapy for IIIC2 high grade serous endometrial carcinoma, with cycle 4 carboplatin taxol on 08-22-13 after break for radiation. She had [name] placed by IR prior to most recent chemo, functioned without difficulty using ice to numb prior to access.   Plan is 6 total cycles of chemotherapy.  Ebony Herring is here for labs and evaluation.  She saw Dr. Fermin Herring earlier today and he was pleased with her progress. Ebony Herring is fatigued from chemotherapy, otherwise, she is doing very well.  She did develop some mild numbness in her feet following her fourth cycle of treatment.  She was evaluated by Dr. Marko Herring and has been taking Vitamin B6 per Dr. Mariana Herring instructions.  Since then, her numbness is improved and is not worse in any way.  She has no numbness or tingling in her fingertips in any way.  Otherwise, she denies fevers, chills, nausea, vomiting, constipation, diarrhea, skin changes, mouth pain, or any further concerns.     [name] in.   ONCOLOGIC HISTORY Patient presented to PCP with vaginal discharge with some bleeding since Nov 2014. She was referred to Dr Ebony Herring, with endometrial biopsy 01-26-2013 showing high grade endometrial carcinoma with serous features. MRI pelvis 01-20-13 Had uterine mass and prominent bilateral iliac nodes. She had TAH BSO and pelvic and para-aortic lymphadenectomy by Dr Ebony Herring on 03-01-2013. Pathology [phone]) had high grade serous carcinoma of endometrium extending to outer half of myometrium, with extensive angiolymphatic invasion, involvement of right ovary, involvement of 2/9 pelvic nodes and 3/4 para-aortic nodes. At completion of surgery there was no gross residual disease. She had 3 cycles of taxol carboplatin from  03-28-13 thru 05-09-2013, with neupogen support. She had pelvic RT 45 Gy thru ~ 07-05-13 and HDR x4 thru 07-27-13. Chemo resumed with taxol carboplatin cycle 4 on 08-22-13, with neupogen support.  Review of systems as above, also: A 10 point review of systems was conducted and is otherwise negative except for what is noted above.     Objective:  Vital signs in last 24 hours:  BP 141/75  Pulse 78  Temp(Src) 98.9 Herring (37.2 C) (Oral)  Resp 18  Ht 5\' 2"  (1.575 m)  Wt 121 lb 12.8 oz (55.248 kg)  BMI 22.27 kg/m2   GENERAL: Patient is a well appearing female in no acute distress HEENT:  Sclerae anicteric.  Oropharynx clear and moist. No ulcerations or evidence of oropharyngeal candidiasis. Neck is supple.  NODES:  No cervical, supraclavicular, or axillary lymphadenopathy palpated.  BREAST EXAM:  Deferred. LUNGS:  Clear to auscultation bilaterally.  No wheezes or rhonchi. HEART:  Regular rate and rhythm. No murmur appreciated. ABDOMEN:  Soft, nontender.  Positive, normoactive bowel sounds. No organomegaly palpated. MSK:  No focal spinal tenderness to palpation. Full range of motion bilaterally in the upper extremities. EXTREMITIES:  No peripheral edema.   SKIN:  Clear with no obvious rashes or skin changes. No nail dyscrasia. NEURO:  Nonfocal. Well oriented.  Appropriate affect.    Lab Results:  Results for orders placed in visit on 09/09/13  CBC WITH DIFFERENTIAL      Result Value Ref Range   WBC 5.0  3.9 - 10.3 10e3/uL   NEUT# 4.0  1.5 - 6.5 10e3/uL   HGB 8.7 (*) 11.6 - 15.9 g/dL   HCT 27.0 (*)  34.8 - 46.6 %   Platelets 212  145 - 400 10e3/uL   MCV 100.5  79.5 - 101.0 fL   MCH 32.4  25.1 - 34.0 pg   MCHC 32.2  31.5 - 36.0 g/dL   RBC 2.69 (*) 3.70 - 5.45 10e6/uL   RDW 14.2  11.2 - 14.5 %   lymph# 0.7 (*) 0.9 - 3.3 10e3/uL   MONO# 0.3  0.1 - 0.9 10e3/uL   Eosinophils Absolute 0.0  0.0 - 0.5 10e3/uL   Basophils Absolute 0.0  0.0 - 0.1 10e3/uL   NEUT% 78.9 (*) 38.4 - 76.8 %    LYMPH% 13.2 (*) 14.0 - 49.7 %   MONO% 6.5  0.0 - 14.0 %   EOS% 0.7  0.0 - 7.0 %   BASO% 0.7  0.0 - 2.0 %     Studies/Results:  No results found.  Medications:  Current Outpatient Prescriptions  Medication Sig Dispense Refill  . Brinzolamide-Brimonidine (SIMBRINZA) 1-0.2 % SUSP Place 1 drop into both eyes 2 (two) times daily.       . ferrous fumarate (HEMOCYTE - 106 MG FE) 325 (106 FE) MG TABS tablet Take 1 tablet by mouth daily.      . pantoprazole (PROTONIX) 40 MG tablet Take 1 tablet (40 mg total) by mouth daily.  30 tablet  1  . pyridOXINE (VITAMIN B-6) 100 MG tablet Take 1 tablet (100 mg total) by mouth 2 (two) times daily.  100 tablet  1  . dexamethasone (DECADRON) 4 MG tablet Take 5 tabs = 20 mg with food 12 hours and 6 hours prior to Taxol chemotherapy.  10 tablet  2  . lidocaine-prilocaine (EMLA) cream Apply 1 application topically as needed.  30 g  0  . loperamide (IMODIUM) 2 MG capsule       . LORazepam (ATIVAN) 0.5 MG tablet Take 1 tablet under the tongue or swallow every 6 hours as needed for nausea  20 tablet  0  . ondansetron (ZOFRAN) 8 MG tablet Take 1-2 tablets (8-16 mg total) by mouth every 12 (twelve) hours as needed for nausea or vomiting.  20 tablet  1   No current facility-administered medications for this visit.      Assessment:  1.IIIC2 high grade serous endometrial carcinoma: surgery 03-01-13, 3 cycles of taxol carboplatin 03-28-13 thru 05-09-13 with gCSF required, pelvic RT + HDR by Dr Ebony Herring completed 07-27-13, now additional 3 cycles chemo.  She will be treated with cycle 5 on 09-12-13 as long as ANC >=1.5 and plt >=100k and chemistries ok. SEE BELOW re taxol depending on neuropathy at time of APP visit. She will have granix 8-4,5,6. Dr. Marko Herring will see her with labs ~ 8-10. 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 See below. 3.[name] in. Very high risk disease, so would be best to keep [name] in until we see how disease responds after this chemotherapy  completes.  4.RT diarrhea with severe dehydration/ ARF/ hypoK and hypoMg: resolved  5.anemia: related to chemo and RT 6.no mammograms in years, no colonoscoppy 7.glaucoma  PLAN  Ebony Herring is doing well today.  Her lab work is stable and I reviewed it with her in detail.  Her neuropathy is improved.  She is taking Vitamin B6 and I recommended that she continue this.  She will proceed with chemotherapy on August 3, unless her neuropathy is worse.  Please see Dr. Mariana Herring discussion below.    "Chemo orders entered for cycle 5 Aug 3, with taxol dose reduced  to 135 mg/m2, which will be used as long as neuropathy in feet is not more bothersome than now. IF NEUROPATHY IN FEET MORE OF CONCERN at time of that treatment, ok to hold taxol and give carboplatin only. GIve gCSF as planned even if taxol held."  Latifah will return on 09/12/13 for cycle 5 of Taxol and Carboplatin, 8/4-8/6 for granix, and on 8/12 for labs and an appointment with Dr. Marko Herring.   She knows to call us in the interim for any questions or concerns.  We can certainly see her sooner if needed.  I spent 15 minutes counseling the patient face to face.  The total time spent in the appointment was 30 minutes.   Ebony Herring [place], [place] [phone]    09/09/2013, 2:36 PM

## 2013-09-09 NOTE — Patient Instructions (Signed)
We'll obtain a CT scan following completion of her chemotherapy.

## 2013-09-12 ENCOUNTER — Other Ambulatory Visit: Payer: Self-pay

## 2013-09-12 ENCOUNTER — Other Ambulatory Visit: Payer: Self-pay | Admitting: Oncology

## 2013-09-12 ENCOUNTER — Ambulatory Visit (HOSPITAL_BASED_OUTPATIENT_CLINIC_OR_DEPARTMENT_OTHER): Payer: Medicare Other

## 2013-09-12 ENCOUNTER — Other Ambulatory Visit (HOSPITAL_BASED_OUTPATIENT_CLINIC_OR_DEPARTMENT_OTHER): Payer: Medicare Other

## 2013-09-12 ENCOUNTER — Telehealth: Payer: Self-pay | Admitting: Oncology

## 2013-09-12 VITALS — BP 145/74 | HR 79 | Temp 98.2°F | Resp 16

## 2013-09-12 DIAGNOSIS — C549 Malignant neoplasm of corpus uteri, unspecified: Secondary | ICD-10-CM

## 2013-09-12 DIAGNOSIS — Z5111 Encounter for antineoplastic chemotherapy: Secondary | ICD-10-CM

## 2013-09-12 DIAGNOSIS — C541 Malignant neoplasm of endometrium: Secondary | ICD-10-CM

## 2013-09-12 LAB — COMPREHENSIVE METABOLIC PANEL (CC13)
ALK PHOS: 55 U/L (ref 40–150)
ALT: 10 U/L (ref 0–55)
AST: 17 U/L (ref 5–34)
Albumin: 3.9 g/dL (ref 3.5–5.0)
Anion Gap: 11 mEq/L (ref 3–11)
BILIRUBIN TOTAL: 0.35 mg/dL (ref 0.20–1.20)
BUN: 17.8 mg/dL (ref 7.0–26.0)
CO2: 22 mEq/L (ref 22–29)
CREATININE: 1.1 mg/dL (ref 0.6–1.1)
Calcium: 9.3 mg/dL (ref 8.4–10.4)
Chloride: 107 mEq/L (ref 98–109)
Glucose: 161 mg/dl — ABNORMAL HIGH (ref 70–140)
Potassium: 4.9 mEq/L (ref 3.5–5.1)
Sodium: 141 mEq/L (ref 136–145)
TOTAL PROTEIN: 6.8 g/dL (ref 6.4–8.3)

## 2013-09-12 LAB — CBC WITH DIFFERENTIAL/PLATELET
BASO%: 0.2 % (ref 0.0–2.0)
Basophils Absolute: 0 10*3/uL (ref 0.0–0.1)
EOS%: 0 % (ref 0.0–7.0)
Eosinophils Absolute: 0 10*3/uL (ref 0.0–0.5)
HCT: 27.2 % — ABNORMAL LOW (ref 34.8–46.6)
HGB: 8.8 g/dL — ABNORMAL LOW (ref 11.6–15.9)
LYMPH%: 9.1 % — AB (ref 14.0–49.7)
MCH: 32.5 pg (ref 25.1–34.0)
MCHC: 32.5 g/dL (ref 31.5–36.0)
MCV: 99.9 fL (ref 79.5–101.0)
MONO#: 0.1 10*3/uL (ref 0.1–0.9)
MONO%: 1.1 % (ref 0.0–14.0)
NEUT#: 4.8 10*3/uL (ref 1.5–6.5)
NEUT%: 89.6 % — ABNORMAL HIGH (ref 38.4–76.8)
PLATELETS: 256 10*3/uL (ref 145–400)
RBC: 2.72 10*6/uL — ABNORMAL LOW (ref 3.70–5.45)
RDW: 14.3 % (ref 11.2–14.5)
WBC: 5.4 10*3/uL (ref 3.9–10.3)
lymph#: 0.5 10*3/uL — ABNORMAL LOW (ref 0.9–3.3)

## 2013-09-12 MED ORDER — SODIUM CHLORIDE 0.9 % IV SOLN
328.5000 mg | Freq: Once | INTRAVENOUS | Status: AC
  Administered 2013-09-12: 330 mg via INTRAVENOUS
  Filled 2013-09-12: qty 33

## 2013-09-12 MED ORDER — DIPHENHYDRAMINE HCL 50 MG/ML IJ SOLN
25.0000 mg | Freq: Once | INTRAMUSCULAR | Status: AC
  Administered 2013-09-12: 25 mg via INTRAVENOUS

## 2013-09-12 MED ORDER — DEXTROSE 5 % IV SOLN
135.0000 mg/m2 | Freq: Once | INTRAVENOUS | Status: AC
  Administered 2013-09-12: 222 mg via INTRAVENOUS
  Filled 2013-09-12: qty 37

## 2013-09-12 MED ORDER — DEXAMETHASONE SODIUM PHOSPHATE 20 MG/5ML IJ SOLN
20.0000 mg | Freq: Once | INTRAMUSCULAR | Status: AC
  Administered 2013-09-12: 20 mg via INTRAVENOUS

## 2013-09-12 MED ORDER — SODIUM CHLORIDE 0.9 % IJ SOLN
10.0000 mL | INTRAMUSCULAR | Status: DC | PRN
  Administered 2013-09-12: 10 mL
  Filled 2013-09-12: qty 10

## 2013-09-12 MED ORDER — HEPARIN SOD (PORK) LOCK FLUSH 100 UNIT/ML IV SOLN
500.0000 [IU] | Freq: Once | INTRAVENOUS | Status: AC | PRN
  Administered 2013-09-12: 500 [IU]
  Filled 2013-09-12: qty 5

## 2013-09-12 MED ORDER — ONDANSETRON 16 MG/50ML IVPB (CHCC)
INTRAVENOUS | Status: AC
  Filled 2013-09-12: qty 16

## 2013-09-12 MED ORDER — FAMOTIDINE IN NACL 20-0.9 MG/50ML-% IV SOLN
INTRAVENOUS | Status: AC
  Filled 2013-09-12: qty 50

## 2013-09-12 MED ORDER — DEXAMETHASONE SODIUM PHOSPHATE 20 MG/5ML IJ SOLN
INTRAMUSCULAR | Status: AC
  Filled 2013-09-12: qty 5

## 2013-09-12 MED ORDER — FAMOTIDINE IN NACL 20-0.9 MG/50ML-% IV SOLN
20.0000 mg | Freq: Once | INTRAVENOUS | Status: AC
  Administered 2013-09-12: 20 mg via INTRAVENOUS

## 2013-09-12 MED ORDER — DIPHENHYDRAMINE HCL 50 MG/ML IJ SOLN
INTRAMUSCULAR | Status: AC
Start: 2013-09-12 — End: 2013-09-12
  Filled 2013-09-12: qty 1

## 2013-09-12 MED ORDER — SODIUM CHLORIDE 0.9 % IV SOLN
Freq: Once | INTRAVENOUS | Status: AC
  Administered 2013-09-12: 13:00:00 via INTRAVENOUS

## 2013-09-12 MED ORDER — ONDANSETRON 16 MG/50ML IVPB (CHCC)
16.0000 mg | Freq: Once | INTRAVENOUS | Status: AC
  Administered 2013-09-12: 16 mg via INTRAVENOUS

## 2013-09-12 NOTE — Telephone Encounter (Signed)
, °

## 2013-09-12 NOTE — Progress Notes (Signed)
OK to treat using Cmet results 09/09/13 per Dr. Marko Plume

## 2013-09-12 NOTE — Patient Instructions (Signed)
[facility] Discharge Instructions for Patients Receiving Chemotherapy  Today you received the following chemotherapy agents: Taxol and Carboplatin.  To help prevent nausea and vomiting after your treatment, we encourage you to take your nausea medication as prescribed.   If you develop nausea and vomiting that is not controlled by your nausea medication, call the clinic.   BELOW ARE SYMPTOMS THAT SHOULD BE REPORTED IMMEDIATELY:  *FEVER GREATER THAN 100.5 F  *CHILLS WITH OR WITHOUT FEVER  NAUSEA AND VOMITING THAT IS NOT CONTROLLED WITH YOUR NAUSEA MEDICATION  *UNUSUAL SHORTNESS OF BREATH  *UNUSUAL BRUISING OR BLEEDING  TENDERNESS IN MOUTH AND THROAT WITH OR WITHOUT PRESENCE OF ULCERS  *URINARY PROBLEMS  *BOWEL PROBLEMS  UNUSUAL RASH Items with * indicate a potential emergency and should be followed up as soon as possible.  Feel free to call the clinic you have any questions or concerns. The clinic phone number is [phone].    

## 2013-09-13 ENCOUNTER — Ambulatory Visit (HOSPITAL_BASED_OUTPATIENT_CLINIC_OR_DEPARTMENT_OTHER): Payer: Medicare Other

## 2013-09-13 ENCOUNTER — Ambulatory Visit: Payer: Medicare Other

## 2013-09-13 ENCOUNTER — Other Ambulatory Visit: Payer: Self-pay | Admitting: Oncology

## 2013-09-13 ENCOUNTER — Other Ambulatory Visit: Payer: Self-pay

## 2013-09-13 VITALS — BP 115/72 | HR 87 | Temp 98.2°F

## 2013-09-13 DIAGNOSIS — C541 Malignant neoplasm of endometrium: Secondary | ICD-10-CM

## 2013-09-13 DIAGNOSIS — Z5189 Encounter for other specified aftercare: Secondary | ICD-10-CM

## 2013-09-13 DIAGNOSIS — C549 Malignant neoplasm of corpus uteri, unspecified: Secondary | ICD-10-CM

## 2013-09-13 MED ORDER — TBO-FILGRASTIM 300 MCG/0.5ML ~~LOC~~ SOSY
300.0000 ug | PREFILLED_SYRINGE | Freq: Once | SUBCUTANEOUS | Status: AC
  Administered 2013-09-13: 300 ug via SUBCUTANEOUS
  Filled 2013-09-13: qty 0.5

## 2013-09-13 MED ORDER — FILGRASTIM 300 MCG/0.5ML IJ SOLN
300.0000 ug | Freq: Once | INTRAMUSCULAR | Status: DC
  Filled 2013-09-13: qty 0.5

## 2013-09-13 NOTE — Patient Instructions (Signed)

## 2013-09-14 ENCOUNTER — Ambulatory Visit (HOSPITAL_BASED_OUTPATIENT_CLINIC_OR_DEPARTMENT_OTHER): Payer: Medicare Other

## 2013-09-14 VITALS — BP 113/58 | HR 72 | Temp 98.2°F

## 2013-09-14 DIAGNOSIS — Z5189 Encounter for other specified aftercare: Secondary | ICD-10-CM

## 2013-09-14 DIAGNOSIS — C549 Malignant neoplasm of corpus uteri, unspecified: Secondary | ICD-10-CM

## 2013-09-14 DIAGNOSIS — C541 Malignant neoplasm of endometrium: Secondary | ICD-10-CM

## 2013-09-14 MED ORDER — FILGRASTIM 300 MCG/0.5ML IJ SOLN: 300.0000 ug | Freq: Once | INTRAMUSCULAR | Status: DC

## 2013-09-14 MED ORDER — TBO-FILGRASTIM 300 MCG/0.5ML ~~LOC~~ SOSY
300.0000 ug | PREFILLED_SYRINGE | Freq: Once | SUBCUTANEOUS | Status: AC
  Administered 2013-09-14: 300 ug via SUBCUTANEOUS
  Filled 2013-09-14: qty 0.5

## 2013-09-15 ENCOUNTER — Ambulatory Visit (HOSPITAL_BASED_OUTPATIENT_CLINIC_OR_DEPARTMENT_OTHER): Payer: Medicare Other

## 2013-09-15 VITALS — BP 110/59 | HR 83 | Temp 98.0°F

## 2013-09-15 DIAGNOSIS — C541 Malignant neoplasm of endometrium: Secondary | ICD-10-CM

## 2013-09-15 DIAGNOSIS — Z5189 Encounter for other specified aftercare: Secondary | ICD-10-CM

## 2013-09-15 DIAGNOSIS — C549 Malignant neoplasm of corpus uteri, unspecified: Secondary | ICD-10-CM

## 2013-09-15 MED ORDER — FILGRASTIM 300 MCG/0.5ML IJ SOLN
300.0000 ug | Freq: Once | INTRAMUSCULAR | Status: DC
  Filled 2013-09-15: qty 0.5

## 2013-09-15 MED ORDER — TBO-FILGRASTIM 300 MCG/0.5ML ~~LOC~~ SOSY
300.0000 ug | PREFILLED_SYRINGE | Freq: Once | SUBCUTANEOUS | Status: AC
  Administered 2013-09-15: 300 ug via SUBCUTANEOUS
  Filled 2013-09-15: qty 0.5

## 2013-09-21 ENCOUNTER — Other Ambulatory Visit (HOSPITAL_BASED_OUTPATIENT_CLINIC_OR_DEPARTMENT_OTHER): Payer: Medicare Other

## 2013-09-21 ENCOUNTER — Encounter: Payer: Self-pay | Admitting: Oncology

## 2013-09-21 ENCOUNTER — Ambulatory Visit (HOSPITAL_BASED_OUTPATIENT_CLINIC_OR_DEPARTMENT_OTHER): Payer: Medicare Other | Admitting: Oncology

## 2013-09-21 VITALS — BP 135/74 | HR 81 | Temp 98.6°F | Resp 18 | Ht 62.0 in | Wt 121.1 lb

## 2013-09-21 DIAGNOSIS — D509 Iron deficiency anemia, unspecified: Secondary | ICD-10-CM

## 2013-09-21 DIAGNOSIS — T451X5A Adverse effect of antineoplastic and immunosuppressive drugs, initial encounter: Secondary | ICD-10-CM

## 2013-09-21 DIAGNOSIS — C541 Malignant neoplasm of endometrium: Secondary | ICD-10-CM

## 2013-09-21 DIAGNOSIS — C549 Malignant neoplasm of corpus uteri, unspecified: Secondary | ICD-10-CM

## 2013-09-21 DIAGNOSIS — D6481 Anemia due to antineoplastic chemotherapy: Secondary | ICD-10-CM

## 2013-09-21 LAB — CBC WITH DIFFERENTIAL/PLATELET
BASO%: 0.7 % (ref 0.0–2.0)
Basophils Absolute: 0 10*3/uL (ref 0.0–0.1)
EOS ABS: 0 10*3/uL (ref 0.0–0.5)
EOS%: 0.9 % (ref 0.0–7.0)
HCT: 24.2 % — ABNORMAL LOW (ref 34.8–46.6)
HGB: 7.9 g/dL — ABNORMAL LOW (ref 11.6–15.9)
LYMPH%: 24.6 % (ref 14.0–49.7)
MCH: 32.9 pg (ref 25.1–34.0)
MCHC: 32.8 g/dL (ref 31.5–36.0)
MCV: 100.2 fL (ref 79.5–101.0)
MONO#: 0.5 10*3/uL (ref 0.1–0.9)
MONO%: 18.6 % — AB (ref 0.0–14.0)
NEUT%: 55.2 % (ref 38.4–76.8)
NEUTROS ABS: 1.4 10*3/uL — AB (ref 1.5–6.5)
PLATELETS: 195 10*3/uL (ref 145–400)
RBC: 2.42 10*6/uL — ABNORMAL LOW (ref 3.70–5.45)
RDW: 14.9 % — AB (ref 11.2–14.5)
WBC: 2.5 10*3/uL — ABNORMAL LOW (ref 3.9–10.3)
lymph#: 0.6 10*3/uL — ABNORMAL LOW (ref 0.9–3.3)

## 2013-09-21 LAB — COMPREHENSIVE METABOLIC PANEL (CC13)
ALK PHOS: 57 U/L (ref 40–150)
ALT: 12 U/L (ref 0–55)
AST: 20 U/L (ref 5–34)
Albumin: 3.7 g/dL (ref 3.5–5.0)
Anion Gap: 12 mEq/L — ABNORMAL HIGH (ref 3–11)
BUN: 13.4 mg/dL (ref 7.0–26.0)
CO2: 23 mEq/L (ref 22–29)
Calcium: 8.3 mg/dL — ABNORMAL LOW (ref 8.4–10.4)
Chloride: 108 mEq/L (ref 98–109)
Creatinine: 1 mg/dL (ref 0.6–1.1)
GLUCOSE: 107 mg/dL (ref 70–140)
POTASSIUM: 3.8 meq/L (ref 3.5–5.1)
Sodium: 143 mEq/L (ref 136–145)
Total Protein: 6.5 g/dL (ref 6.4–8.3)

## 2013-09-21 MED ORDER — FERROUS FUMARATE 325 (106 FE) MG PO TABS: 1.0000 | ORAL_TABLET | Freq: Every day | ORAL | Status: DC

## 2013-09-21 NOTE — Progress Notes (Signed)
OFFICE PROGRESS NOTE   09/21/2013   Physicians:Daniel ClarkePearson, Ronda, Lonell Grandchild F (PCP [place] [facility]), Gery Pray, Elisabeth Pigeon (ophth)   INTERVAL HISTORY:   Patient is seen, alone for visit, in continuing attention to adjuvant chemotherapy for IIIC2 high grade serous endometrial carcinoma, post cycle 5 carboplatin taxol on 09-12-13 with granix on 8-4,5,6.  Plan is for 6 cycles of this chemotherapy; she is for CT on 10-5 and to see Dr Josephina Shih on 11-18-13. She has been more fatigued since most recent chemotherapy, particularly as there was a death in husband's family, so that she was not able to rest as usual. She has had no nausea and bowels are moving adequately. Peripheral neuropathy is a little better in toes, now just noticeable on left, and none in hands.   She has [name].  ONCOLOGIC HISTORY Patient presented to PCP with vaginal discharge with some bleeding since Nov 2014. 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 MRI pelvis 01-20-13 Had uterine mass and prominent bilateral iliac nodes. She had TAH BSO and pelvic and para-aortic lymphadenectomy by Dr Josephina Shih on 03-01-2013. Pathology [phone]) had high grade serous carcinoma of endometrium extending to outer half of myometrium, with extensive angiolymphatic invasion, involvement of right ovary, involvement of 2/9 pelvic nodes and 3/4 para-aortic nodes. At completion of surgery there was no gross residual disease. She had 3 cycles of taxol carboplatin from 03-28-13 thru 05-09-2013, with neupogen support. She had pelvic RT 45 Gy thru ~ 07-05-13 and HDR x4 thru 07-27-13. Chemo resumed with taxol carboplatin cycle 4 on 08-22-13, with neupogen support.  Review of systems as above, also: No SOB at rest, no chest pain. No bleeding. Denies face swelling or discomfort. No fever or symptoms of infection  No problems with [name]. Remainder of  10 point Review of Systems negative.  Objective:  Vital signs in last 24 hours:  BP 135/74  Pulse 81  Temp(Src) 98.6 F (37 C) (Oral)  Resp 18  Ht 5\' 2"  (1.575 m)  Wt 121 lb 1.6 oz (54.931 kg)  BMI 22.14 kg/m2  Weight is stable Alert, oriented and appropriate. Ambulatory without difficulty.  Alopecia  HEENT:PERRL, sclerae not icteric. Oral mucosa moist without lesions, posterior pharynx clear. Mild puffiness right periorbital and right face, no tenderness. No swelling neck. Neck supple. No JVD.  Lymphatics:no cervical,suraclavicular adenopathy Resp: clear to auscultation bilaterally and normal percussion bilaterally Cardio: regular rate and rhythm. No gallop. GI: soft, nontender, not distended, no mass or organomegaly. Normally active bowel sounds. Surgical incision not remarkable. Musculoskeletal/ Extremities: without pitting edema, cords, tenderness Neuro: peripheral neuropathy as above. Otherwise nonfocal. PSYCH appropriate mood and affect Skin without rash, ecchymosis, petechiae Portacath-without erythema or tenderness  Lab Results:  Results for orders placed in visit on 09/21/13  CBC WITH DIFFERENTIAL      Result Value Ref Range   WBC 2.5 (*) 3.9 - 10.3 10e3/uL   NEUT# 1.4 (*) 1.5 - 6.5 10e3/uL   HGB 7.9 (*) 11.6 - 15.9 g/dL   HCT 24.2 (*) 34.8 - 46.6 %   Platelets 195  145 - 400 10e3/uL   MCV 100.2  79.5 - 101.0 fL   MCH 32.9  25.1 - 34.0 pg   MCHC 32.8  31.5 - 36.0 g/dL   RBC 2.42 (*) 3.70 - 5.45 10e6/uL   RDW 14.9 (*) 11.2 - 14.5 %   lymph# 0.6 (*) 0.9 - 3.3 10e3/uL  MONO# 0.5  0.1 - 0.9 10e3/uL   Eosinophils Absolute 0.0  0.0 - 0.5 10e3/uL   Basophils Absolute 0.0  0.0 - 0.1 10e3/uL   NEUT% 55.2  38.4 - 76.8 %   LYMPH% 24.6  14.0 - 49.7 %   MONO% 18.6 (*) 0.0 - 14.0 %   EOS% 0.9  0.0 - 7.0 %   BASO% 0.7  0.0 - 2.0 %  COMPREHENSIVE METABOLIC PANEL (XB14)      Result Value Ref Range   Sodium 143  136 - 145 mEq/L   Potassium 3.8  3.5 - 5.1 mEq/L    Chloride 108  98 - 109 mEq/L   CO2 23  22 - 29 mEq/L   Glucose 107  70 - 140 mg/dl   BUN 13.4  7.0 - 26.0 mg/dL   Creatinine 1.0  0.6 - 1.1 mg/dL   Total Bilirubin <0.20  0.20 - 1.20 mg/dL   Alkaline Phosphatase 57  40 - 150 U/L   AST 20  5 - 34 U/L   ALT 12  0 - 55 U/L   Total Protein 6.5  6.4 - 8.3 g/dL   Albumin 3.7  3.5 - 5.0 g/dL   Calcium 8.3 (*) 8.4 - 10.4 mg/dL   Anion Gap 12 (*) 3 - 11 mEq/L     Studies/Results:  No results found.  Medications: I have reviewed the patient's current medications. She is taking B6. Taxol dose decreased to 135 mg/m2 with cycle 5 due to peripheral neuropathy complaints   DISCUSSION Discussed peripheral neuropathy. Discussed PRBCs if anemia more symptomatic, tho she does not feel that she needs transfusion now.  Assessment/Plan:  1.IIIC2 high grade serous endometrial carcinoma: surgery 03-01-13, 3 cycles of taxol carboplatin 03-28-13 thru 05-09-13 with gCSF required, pelvic RT + HDR by Dr Sondra Come completed 07-27-13, now additional 3 cycles chemo.   She will have CBC on 8-21 to be sure counts appear adequate for cycle 6 on 10-03-13.  3.[name] in. Very high risk disease, so would be best to keep [name] in until we see how disease responds after this chemotherapy completes.  4.RT diarrhea with severe dehydration/ ARF/ hypoK and hypoMg: resolved with hospitalization in June  5.anemia: related to chemo and RT. 6.no mammograms in years, no colonoscoppy  7.glaucoma    Patient is in agreement with plan above and will let us know prior to next scheduled visit if more symptomatic from anemia or other concerns. Chemo orders entered, to be confirmed by chemistries and counts closest to treatment.   LIVESAY,LENNIS P, MD   09/21/2013, 3:06 PM

## 2013-09-24 ENCOUNTER — Telehealth: Payer: Self-pay | Admitting: Oncology

## 2013-09-24 NOTE — Telephone Encounter (Signed)
S/W RELATIVE RE NEXT APPT FOR 8/20 - PT TO GET NEW SCHEDULE WHEN SHE COMES IN.

## 2013-09-29 ENCOUNTER — Other Ambulatory Visit (HOSPITAL_BASED_OUTPATIENT_CLINIC_OR_DEPARTMENT_OTHER): Payer: Medicare Other

## 2013-09-29 DIAGNOSIS — C549 Malignant neoplasm of corpus uteri, unspecified: Secondary | ICD-10-CM

## 2013-09-29 DIAGNOSIS — C541 Malignant neoplasm of endometrium: Secondary | ICD-10-CM

## 2013-09-29 LAB — CBC WITH DIFFERENTIAL/PLATELET
BASO%: 0.6 % (ref 0.0–2.0)
Basophils Absolute: 0 10*3/uL (ref 0.0–0.1)
EOS ABS: 0 10*3/uL (ref 0.0–0.5)
EOS%: 0.3 % (ref 0.0–7.0)
HEMATOCRIT: 23.8 % — AB (ref 34.8–46.6)
HGB: 7.8 g/dL — ABNORMAL LOW (ref 11.6–15.9)
LYMPH%: 19.3 % (ref 14.0–49.7)
MCH: 33.6 pg (ref 25.1–34.0)
MCHC: 32.9 g/dL (ref 31.5–36.0)
MCV: 102 fL — AB (ref 79.5–101.0)
MONO#: 0.3 10*3/uL (ref 0.1–0.9)
MONO%: 11.1 % (ref 0.0–14.0)
NEUT%: 68.7 % (ref 38.4–76.8)
NEUTROS ABS: 2.1 10*3/uL (ref 1.5–6.5)
PLATELETS: 144 10*3/uL — AB (ref 145–400)
RBC: 2.33 10*6/uL — ABNORMAL LOW (ref 3.70–5.45)
RDW: 16.8 % — ABNORMAL HIGH (ref 11.2–14.5)
WBC: 3 10*3/uL — AB (ref 3.9–10.3)
lymph#: 0.6 10*3/uL — ABNORMAL LOW (ref 0.9–3.3)

## 2013-09-29 LAB — COMPREHENSIVE METABOLIC PANEL (CC13)
ALBUMIN: 3.7 g/dL (ref 3.5–5.0)
ALK PHOS: 54 U/L (ref 40–150)
ALT: 11 U/L (ref 0–55)
ANION GAP: 10 meq/L (ref 3–11)
AST: 21 U/L (ref 5–34)
BUN: 14.5 mg/dL (ref 7.0–26.0)
CO2: 22 mEq/L (ref 22–29)
Calcium: 8.5 mg/dL (ref 8.4–10.4)
Chloride: 108 mEq/L (ref 98–109)
Creatinine: 1 mg/dL (ref 0.6–1.1)
Glucose: 96 mg/dl (ref 70–140)
Potassium: 4.2 mEq/L (ref 3.5–5.1)
Sodium: 141 mEq/L (ref 136–145)
Total Bilirubin: <0.2 mg/dL (ref 0.20–1.20)
Total Protein: 6.5 g/dL (ref 6.4–8.3)

## 2013-09-30 ENCOUNTER — Telehealth: Payer: Self-pay

## 2013-09-30 NOTE — Telephone Encounter (Signed)
Told Ebony Herring that her counts from 09-29-13 were fine for her to have her treatment on 10-03-13 as scheduled. She needs to take the decadron premedication Sunday 10-02-13 at 10 pm and then at 0400 on 10-03-13 with food. Ebony. Herring's Hgb 09-29-13 continued to be low at 7.8.  She states that she is not SOB.  She has been washing clothes and cooking all day today.  She does not feel she needs a blood transfusion at this time.  She knows to let us know if she becomes sob.

## 2013-10-03 ENCOUNTER — Ambulatory Visit (HOSPITAL_BASED_OUTPATIENT_CLINIC_OR_DEPARTMENT_OTHER): Payer: Medicare Other

## 2013-10-03 ENCOUNTER — Other Ambulatory Visit (HOSPITAL_BASED_OUTPATIENT_CLINIC_OR_DEPARTMENT_OTHER): Payer: Medicare Other

## 2013-10-03 ENCOUNTER — Other Ambulatory Visit: Payer: Self-pay | Admitting: Oncology

## 2013-10-03 VITALS — BP 142/81 | HR 90 | Temp 98.0°F | Resp 18

## 2013-10-03 DIAGNOSIS — C549 Malignant neoplasm of corpus uteri, unspecified: Secondary | ICD-10-CM

## 2013-10-03 DIAGNOSIS — Z5111 Encounter for antineoplastic chemotherapy: Secondary | ICD-10-CM

## 2013-10-03 DIAGNOSIS — C541 Malignant neoplasm of endometrium: Secondary | ICD-10-CM

## 2013-10-03 LAB — CBC WITH DIFFERENTIAL/PLATELET
BASO%: 0.3 % (ref 0.0–2.0)
Basophils Absolute: 0 10*3/uL (ref 0.0–0.1)
EOS ABS: 0 10*3/uL (ref 0.0–0.5)
EOS%: 0 % (ref 0.0–7.0)
HCT: 28.2 % — ABNORMAL LOW (ref 34.8–46.6)
HGB: 9.2 g/dL — ABNORMAL LOW (ref 11.6–15.9)
LYMPH#: 0.7 10*3/uL — AB (ref 0.9–3.3)
LYMPH%: 10.5 % — ABNORMAL LOW (ref 14.0–49.7)
MCH: 33 pg (ref 25.1–34.0)
MCHC: 32.6 g/dL (ref 31.5–36.0)
MCV: 101.1 fL — ABNORMAL HIGH (ref 79.5–101.0)
MONO#: 0.1 10*3/uL (ref 0.1–0.9)
MONO%: 1.4 % (ref 0.0–14.0)
NEUT#: 5.5 10*3/uL (ref 1.5–6.5)
NEUT%: 87.8 % — ABNORMAL HIGH (ref 38.4–76.8)
NRBC: 0 % (ref 0–0)
PLATELETS: 193 10*3/uL (ref 145–400)
RBC: 2.79 10*6/uL — AB (ref 3.70–5.45)
RDW: 16.9 % — ABNORMAL HIGH (ref 11.2–14.5)
WBC: 6.3 10*3/uL (ref 3.9–10.3)

## 2013-10-03 MED ORDER — FAMOTIDINE IN NACL 20-0.9 MG/50ML-% IV SOLN
INTRAVENOUS | Status: AC
  Filled 2013-10-03: qty 50

## 2013-10-03 MED ORDER — SODIUM CHLORIDE 0.9 % IV SOLN
Freq: Once | INTRAVENOUS | Status: AC
  Administered 2013-10-03: 10:00:00 via INTRAVENOUS

## 2013-10-03 MED ORDER — DEXAMETHASONE SODIUM PHOSPHATE 20 MG/5ML IJ SOLN
INTRAMUSCULAR | Status: AC
  Filled 2013-10-03: qty 5

## 2013-10-03 MED ORDER — PACLITAXEL CHEMO INJECTION 300 MG/50ML
135.0000 mg/m2 | Freq: Once | INTRAVENOUS | Status: AC
  Administered 2013-10-03: 222 mg via INTRAVENOUS
  Filled 2013-10-03: qty 37

## 2013-10-03 MED ORDER — ONDANSETRON 16 MG/50ML IVPB (CHCC)
INTRAVENOUS | Status: AC
  Filled 2013-10-03: qty 16

## 2013-10-03 MED ORDER — ONDANSETRON 16 MG/50ML IVPB (CHCC)
16.0000 mg | Freq: Once | INTRAVENOUS | Status: AC
  Administered 2013-10-03: 16 mg via INTRAVENOUS

## 2013-10-03 MED ORDER — DIPHENHYDRAMINE HCL 50 MG/ML IJ SOLN
25.0000 mg | Freq: Once | INTRAMUSCULAR | Status: AC
  Administered 2013-10-03: 25 mg via INTRAVENOUS

## 2013-10-03 MED ORDER — HEPARIN SOD (PORK) LOCK FLUSH 100 UNIT/ML IV SOLN
500.0000 [IU] | Freq: Once | INTRAVENOUS | Status: AC | PRN
  Administered 2013-10-03: 500 [IU]
  Filled 2013-10-03: qty 5

## 2013-10-03 MED ORDER — DEXAMETHASONE SODIUM PHOSPHATE 20 MG/5ML IJ SOLN
20.0000 mg | Freq: Once | INTRAMUSCULAR | Status: AC
  Administered 2013-10-03: 20 mg via INTRAVENOUS

## 2013-10-03 MED ORDER — FAMOTIDINE IN NACL 20-0.9 MG/50ML-% IV SOLN
20.0000 mg | Freq: Once | INTRAVENOUS | Status: AC
  Administered 2013-10-03: 20 mg via INTRAVENOUS

## 2013-10-03 MED ORDER — DIPHENHYDRAMINE HCL 50 MG/ML IJ SOLN
INTRAMUSCULAR | Status: AC
  Filled 2013-10-03: qty 1

## 2013-10-03 MED ORDER — CARBOPLATIN CHEMO INJECTION 450 MG/45ML
345.5000 mg | Freq: Once | INTRAVENOUS | Status: AC
  Administered 2013-10-03: 350 mg via INTRAVENOUS
  Filled 2013-10-03: qty 35

## 2013-10-03 MED ORDER — SODIUM CHLORIDE 0.9 % IJ SOLN
10.0000 mL | INTRAMUSCULAR | Status: DC | PRN
  Administered 2013-10-03: 10 mL
  Filled 2013-10-03: qty 10

## 2013-10-03 NOTE — Patient Instructions (Signed)
[facility] Discharge Instructions for Patients Receiving Chemotherapy  Today you received the following chemotherapy agents: taxol, carboplatin   To help prevent nausea and vomiting after your treatment, we encourage you to take your nausea medication as prescribed.    If you develop nausea and vomiting that is not controlled by your nausea medication, call the clinic.   BELOW ARE SYMPTOMS THAT SHOULD BE REPORTED IMMEDIATELY:  *FEVER GREATER THAN 100.5 F  *CHILLS WITH OR WITHOUT FEVER  NAUSEA AND VOMITING THAT IS NOT CONTROLLED WITH YOUR NAUSEA MEDICATION  *UNUSUAL SHORTNESS OF BREATH  *UNUSUAL BRUISING OR BLEEDING  TENDERNESS IN MOUTH AND THROAT WITH OR WITHOUT PRESENCE OF ULCERS  *URINARY PROBLEMS  *BOWEL PROBLEMS  UNUSUAL RASH Items with * indicate a potential emergency and should be followed up as soon as possible.  Feel free to call the clinic you have any questions or concerns. The clinic phone number is [phone].    

## 2013-10-04 ENCOUNTER — Ambulatory Visit (HOSPITAL_BASED_OUTPATIENT_CLINIC_OR_DEPARTMENT_OTHER): Payer: Medicare Other

## 2013-10-04 VITALS — BP 110/66 | HR 87 | Temp 98.4°F

## 2013-10-04 DIAGNOSIS — C541 Malignant neoplasm of endometrium: Secondary | ICD-10-CM

## 2013-10-04 DIAGNOSIS — Z5189 Encounter for other specified aftercare: Secondary | ICD-10-CM

## 2013-10-04 DIAGNOSIS — C549 Malignant neoplasm of corpus uteri, unspecified: Secondary | ICD-10-CM

## 2013-10-04 MED ORDER — TBO-FILGRASTIM 300 MCG/0.5ML ~~LOC~~ SOSY
300.0000 ug | PREFILLED_SYRINGE | Freq: Once | SUBCUTANEOUS | Status: AC
  Administered 2013-10-04: 300 ug via SUBCUTANEOUS
  Filled 2013-10-04: qty 0.5

## 2013-10-05 ENCOUNTER — Ambulatory Visit (HOSPITAL_BASED_OUTPATIENT_CLINIC_OR_DEPARTMENT_OTHER): Payer: Medicare Other

## 2013-10-05 VITALS — BP 110/62 | HR 81 | Temp 98.6°F

## 2013-10-05 DIAGNOSIS — C541 Malignant neoplasm of endometrium: Secondary | ICD-10-CM

## 2013-10-05 DIAGNOSIS — Z5189 Encounter for other specified aftercare: Secondary | ICD-10-CM

## 2013-10-05 DIAGNOSIS — C549 Malignant neoplasm of corpus uteri, unspecified: Secondary | ICD-10-CM

## 2013-10-05 MED ORDER — TBO-FILGRASTIM 300 MCG/0.5ML ~~LOC~~ SOSY
300.0000 ug | PREFILLED_SYRINGE | Freq: Once | SUBCUTANEOUS | Status: AC
  Administered 2013-10-05: 300 ug via SUBCUTANEOUS
  Filled 2013-10-05: qty 0.5

## 2013-10-06 ENCOUNTER — Ambulatory Visit (HOSPITAL_BASED_OUTPATIENT_CLINIC_OR_DEPARTMENT_OTHER): Payer: Medicare Other

## 2013-10-06 VITALS — BP 103/67 | HR 93 | Temp 98.4°F

## 2013-10-06 DIAGNOSIS — Z5189 Encounter for other specified aftercare: Secondary | ICD-10-CM

## 2013-10-06 DIAGNOSIS — C549 Malignant neoplasm of corpus uteri, unspecified: Secondary | ICD-10-CM

## 2013-10-06 DIAGNOSIS — C541 Malignant neoplasm of endometrium: Secondary | ICD-10-CM

## 2013-10-06 MED ORDER — TBO-FILGRASTIM 300 MCG/0.5ML ~~LOC~~ SOSY
300.0000 ug | PREFILLED_SYRINGE | Freq: Once | SUBCUTANEOUS | Status: AC
  Administered 2013-10-06: 300 ug via SUBCUTANEOUS
  Filled 2013-10-06: qty 0.5

## 2013-10-17 ENCOUNTER — Other Ambulatory Visit: Payer: Self-pay | Admitting: Oncology

## 2013-10-19 ENCOUNTER — Other Ambulatory Visit (HOSPITAL_BASED_OUTPATIENT_CLINIC_OR_DEPARTMENT_OTHER): Payer: Medicare Other

## 2013-10-19 ENCOUNTER — Telehealth: Payer: Self-pay | Admitting: Oncology

## 2013-10-19 ENCOUNTER — Ambulatory Visit (HOSPITAL_BASED_OUTPATIENT_CLINIC_OR_DEPARTMENT_OTHER): Payer: Medicare Other | Admitting: Oncology

## 2013-10-19 ENCOUNTER — Encounter: Payer: Self-pay | Admitting: Oncology

## 2013-10-19 VITALS — BP 127/71 | HR 71 | Temp 98.7°F | Resp 20 | Ht 62.0 in | Wt 122.6 lb

## 2013-10-19 DIAGNOSIS — C549 Malignant neoplasm of corpus uteri, unspecified: Secondary | ICD-10-CM

## 2013-10-19 DIAGNOSIS — T451X5A Adverse effect of antineoplastic and immunosuppressive drugs, initial encounter: Secondary | ICD-10-CM

## 2013-10-19 DIAGNOSIS — G629 Polyneuropathy, unspecified: Secondary | ICD-10-CM

## 2013-10-19 DIAGNOSIS — Z23 Encounter for immunization: Secondary | ICD-10-CM

## 2013-10-19 DIAGNOSIS — G5790 Unspecified mononeuropathy of unspecified lower limb: Secondary | ICD-10-CM

## 2013-10-19 DIAGNOSIS — C541 Malignant neoplasm of endometrium: Secondary | ICD-10-CM

## 2013-10-19 DIAGNOSIS — D6481 Anemia due to antineoplastic chemotherapy: Secondary | ICD-10-CM

## 2013-10-19 LAB — COMPREHENSIVE METABOLIC PANEL (CC13)
ALT: 12 U/L (ref 0–55)
ANION GAP: 10 meq/L (ref 3–11)
AST: 22 U/L (ref 5–34)
Albumin: 3.6 g/dL (ref 3.5–5.0)
Alkaline Phosphatase: 56 U/L (ref 40–150)
BILIRUBIN TOTAL: 0.2 mg/dL (ref 0.20–1.20)
BUN: 13.5 mg/dL (ref 7.0–26.0)
CO2: 23 mEq/L (ref 22–29)
CREATININE: 1.1 mg/dL (ref 0.6–1.1)
Calcium: 8.1 mg/dL — ABNORMAL LOW (ref 8.4–10.4)
Chloride: 107 mEq/L (ref 98–109)
Glucose: 96 mg/dl (ref 70–140)
Potassium: 4.4 mEq/L (ref 3.5–5.1)
Sodium: 140 mEq/L (ref 136–145)
Total Protein: 6.4 g/dL (ref 6.4–8.3)

## 2013-10-19 LAB — CBC WITH DIFFERENTIAL/PLATELET
BASO%: 0.6 % (ref 0.0–2.0)
Basophils Absolute: 0 10*3/uL (ref 0.0–0.1)
EOS%: 0.3 % (ref 0.0–7.0)
Eosinophils Absolute: 0 10*3/uL (ref 0.0–0.5)
HCT: 23.5 % — ABNORMAL LOW (ref 34.8–46.6)
HGB: 7.7 g/dL — ABNORMAL LOW (ref 11.6–15.9)
LYMPH%: 22.8 % (ref 14.0–49.7)
MCH: 34.1 pg — AB (ref 25.1–34.0)
MCHC: 32.6 g/dL (ref 31.5–36.0)
MCV: 104.7 fL — AB (ref 79.5–101.0)
MONO#: 0.3 10*3/uL (ref 0.1–0.9)
MONO%: 12.4 % (ref 0.0–14.0)
NEUT#: 1.7 10*3/uL (ref 1.5–6.5)
NEUT%: 63.9 % (ref 38.4–76.8)
PLATELETS: 108 10*3/uL — AB (ref 145–400)
RBC: 2.24 10*6/uL — ABNORMAL LOW (ref 3.70–5.45)
RDW: 19 % — ABNORMAL HIGH (ref 11.2–14.5)
WBC: 2.6 10*3/uL — ABNORMAL LOW (ref 3.9–10.3)
lymph#: 0.6 10*3/uL — ABNORMAL LOW (ref 0.9–3.3)

## 2013-10-19 MED ORDER — INFLUENZA VAC SPLIT QUAD 0.5 ML IM SUSY
0.5000 mL | PREFILLED_SYRINGE | Freq: Once | INTRAMUSCULAR | Status: AC
  Administered 2013-10-19: 0.5 mL via INTRAMUSCULAR
  Filled 2013-10-19: qty 0.5

## 2013-10-19 MED ORDER — VITAMIN B-6 100 MG PO TABS: 100.0000 mg | ORAL_TABLET | Freq: Two times a day (BID) | ORAL | Status: DC

## 2013-10-19 NOTE — Telephone Encounter (Signed)
per pof to sch pt appt-gave pt copy of sch °

## 2013-10-19 NOTE — Progress Notes (Signed)
OFFICE PROGRESS NOTE   10/19/2013   Physicians:Daniel ClarkePearson, [place], [place] F (PCP [place] [facility]), Gery Pray, Everlene Farrier, Donato Heinz (ophth   INTERVAL HISTORY:   Patient is seen, alone for visit, in follow up of adjuvant chemotherapy just completed for IIIC2 high grade serous endometrial carcinoma, having had cycle 6 carboplatin and taxol given 10-03-13 with neupogen x3 days beginning day 2. Plan is for CT CAP on 11-14-13 prior to reevaluation by Dr Josephina Shih on 11-18-13; she is to see Dr Sondra Come next on 12-08-13.  Patient reports much longer and more marked fatigue after most recent chemotherapy, tho she has felt better this week, even able to work a little in the yard. Peripheral neuropathy is noticeable in toes and anterior feet bilaterally, none in hands. She is eating, tho taste is not correct; she has no nausea now and bowels are moving. She denies SOB at rest or walking in office today. She has not seen bleeding.  She has [name], flushed 10-03-13 and she understands that this should be used for CT in October. She had flu vaccine today.  ONCOLOGIC HISTORY Patient presented to PCP with vaginal discharge with some bleeding since Nov 2014. 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 MRI pelvis 01-20-13 Had uterine mass and prominent bilateral iliac nodes. She had TAH BSO and pelvic and para-aortic lymphadenectomy by Dr Josephina Shih on 03-01-2013. Pathology [phone]) had high grade serous carcinoma of endometrium extending to outer half of myometrium, with extensive angiolymphatic invasion, involvement of right ovary, involvement of 2/9 pelvic nodes and 3/4 para-aortic nodes. At completion of surgery there was no gross residual disease. She had 3 cycles of taxol carboplatin from 03-28-13 thru 05-09-2013, with neupogen support. She had pelvic RT 45 Gy thru ~ 07-05-13 and HDR x4 thru 07-27-13. Chemo  resumed with taxol carboplatin cycle 4 on 08-22-13, with neupogen support.  Review of systems as above, also: No fever or symptoms of infection. No chest pain. Bowels moving. No LE swelling. Remainder of 10 point Review of Systems negative.  Objective:  Vital signs in last 24 hours:  BP 127/71  Pulse 71  Temp(Src) 98.7 F (37.1 C) (Oral)  Resp 20  Ht 5\' 2"  (1.575 m)  Wt 122 lb 9.6 oz (55.611 kg)  BMI 22.42 kg/m2  Weight is up 1 lb  Alert, oriented and appropriate. Ambulatory without assistance. Respirations not labored with activity in office. Looks comfortable and upbeat today. Alopecia  HEENT:PERRL, sclerae not icteric. Oral mucosa moist without lesions, posterior pharynx clear. Mucous membranes somewhat pale Neck supple. No JVD.  Lymphatics:no cervical,suraclavicular, axillary or inguinal adenopathy Resp: clear to auscultation bilaterally and normal percussion bilaterally Cardio: regular rate and rhythm. No gallop. GI: soft, nontender, not distended, no mass or organomegaly. Normally active bowel sounds. Surgical incision not remarkable. Musculoskeletal/ Extremities: without pitting edema, cords, tenderness Neuro: decreased light touch toes and distal feet bilaterally. Otherwise nonfocal. PSYCH appropriate mood and affect Skin without rash, ecchymosis, petechiae Portacath-without erythema or tenderness  Lab Results:  Results for orders placed in visit on 10/19/13  CBC WITH DIFFERENTIAL      Result Value Ref Range   WBC 2.6 (*) 3.9 - 10.3 10e3/uL   NEUT# 1.7  1.5 - 6.5 10e3/uL   HGB 7.7 (*) 11.6 - 15.9 g/dL   HCT 23.5 (*) 34.8 - 46.6 %   Platelets 108 (*) 145 - 400 10e3/uL   MCV 104.7 (*) 79.5 - 101.0  fL   MCH 34.1 (*) 25.1 - 34.0 pg   MCHC 32.6  31.5 - 36.0 g/dL   RBC 2.24 (*) 3.70 - 5.45 10e6/uL   RDW 19.0 (*) 11.2 - 14.5 %   lymph# 0.6 (*) 0.9 - 3.3 10e3/uL   MONO# 0.3  0.1 - 0.9 10e3/uL   Eosinophils Absolute 0.0  0.0 - 0.5 10e3/uL   Basophils Absolute 0.0  0.0  - 0.1 10e3/uL   NEUT% 63.9  38.4 - 76.8 %   LYMPH% 22.8  14.0 - 49.7 %   MONO% 12.4  0.0 - 14.0 %   EOS% 0.3  0.0 - 7.0 %   BASO% 0.6  0.0 - 2.0 %  COMPREHENSIVE METABOLIC PANEL (OF75)      Result Value Ref Range   Sodium 140  136 - 145 mEq/L   Potassium 4.4  3.5 - 5.1 mEq/L   Chloride 107  98 - 109 mEq/L   CO2 23  22 - 29 mEq/L   Glucose 96  70 - 140 mg/dl   BUN 13.5  7.0 - 26.0 mg/dL   Creatinine 1.1  0.6 - 1.1 mg/dL   Total Bilirubin 0.20  0.20 - 1.20 mg/dL   Alkaline Phosphatase 56  40 - 150 U/L   AST 22  5 - 34 U/L   ALT 12  0 - 55 U/L   Total Protein 6.4  6.4 - 8.3 g/dL   Albumin 3.6  3.5 - 5.0 g/dL   Calcium 8.1 (*) 8.4 - 10.4 mg/dL   Anion Gap 10  3 - 11 mEq/L     Studies/Results:  No results found.  Medications: I have reviewed the patient's current medications. She will continue oral iron and B6  DISCUSSION: We have discussed the anemia, related to cumulative chemotherapy and radiation to pelvis. She understands that she should call if she is more fatigued or SOB prior to next labs; we will repeat CBC when she is back at this office for gyn oncology visit. She will continue oral iron. We have discussed taxol related peripheral neuropathy, which hopefully will improve out further from the chemotherapy, tho I have told her again that in some cases the neuropathy does not resolve entirely. She will continue B6 and massage/ exercise feet as much as possible.  Assessment/Plan:  1.IIIC2 high grade serous endometrial carcinoma: surgery 03-01-13, 3 cycles of taxol carboplatin 03-28-13 thru 05-09-13 with gCSF required, pelvic RT + HDR by Dr Sondra Come completed 07-27-13, and 3 additional cycles of taxol and carboplatin completed 10-03-13. Plan CT and gyn oncology reevaluation in early Oct.  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 Plan as above. 3.[name] in. Very high risk disease, so would be best to keep [name] in until we  see how disease responds after this chemotherapy completes.  4.RT diarrhea with severe dehydration/ ARF/ hypoK and hypoMg: resolved with hospitalization in June  5.anemia: related to chemo and RT. She is feeling some better overall this week, so will try to let this improve out further from chemo. Oral iron, repeat CBC 11-18-13 6.no mammograms in years, no colonoscoppy  7.glaucoma e.  I will see her back with [name] flush late Nov, or sooner if needed. Time spent 25 min including >50% counseling and coordination of care   Waldine Zenz P, MD   10/19/2013, 2:32 PM

## 2013-10-20 ENCOUNTER — Encounter: Payer: Self-pay | Admitting: Oncology

## 2013-10-20 NOTE — Progress Notes (Signed)
[place] END OF TREATMENT   Name: CHERRYL BABIN Date: 10/20/2013 MRN: [id number] DOB: 02-Jan-1932   TREATMENT DATES: 03-28-13 thru 10-03-13, given in sandwich fashion with radiation   REFERRING PHYSICIAN: Deedra Ehrich  DIAGNOSIS: high grade serous endometrial carcinoma  STAGE AT START OF TREATMENT: IIIC2   INTENT:curative   DRUGS OR REGIMENS GIVEN: carboplatin taxol q 3 weeks x 6 cycles   MAJOR TOXICITIES: fatigue, anemia, peripheral neuropathy   REASON TREATMENT STOPPED: completion of planned course   PERFORMANCE STATUS AT END: 1   ONGOING PROBLEMS: fatigue, anemia, peripheral neuropathy   FOLLOW UP PLANS: CT CAP and reevaluation by gyn oncology 11-2013

## 2013-11-09 ENCOUNTER — Other Ambulatory Visit: Payer: Medicare Other

## 2013-11-14 ENCOUNTER — Encounter (HOSPITAL_COMMUNITY): Payer: Self-pay

## 2013-11-14 ENCOUNTER — Ambulatory Visit (HOSPITAL_COMMUNITY)
Admission: RE | Admit: 2013-11-14 | Discharge: 2013-11-14 | Disposition: A | Discharge status: Home / Self Care | Payer: Medicare Other | Source: Ambulatory Visit | Attending: Gynecology | Admitting: Gynecology

## 2013-11-14 DIAGNOSIS — C541 Malignant neoplasm of endometrium: Secondary | ICD-10-CM | POA: Diagnosis not present

## 2013-11-14 MED ORDER — IOHEXOL 300 MG/ML  SOLN
80.0000 mL | Freq: Once | INTRAMUSCULAR | Status: AC | PRN
  Administered 2013-11-14: 80 mL via INTRAVENOUS

## 2013-11-17 ENCOUNTER — Other Ambulatory Visit: Payer: Self-pay

## 2013-11-17 DIAGNOSIS — C541 Malignant neoplasm of endometrium: Secondary | ICD-10-CM

## 2013-11-18 ENCOUNTER — Other Ambulatory Visit: Payer: Medicare Other

## 2013-11-18 ENCOUNTER — Ambulatory Visit: Payer: Medicare Other | Attending: Gynecology | Admitting: Gynecology

## 2013-11-18 ENCOUNTER — Telehealth: Payer: Self-pay

## 2013-11-18 ENCOUNTER — Encounter: Payer: Self-pay | Admitting: Gynecology

## 2013-11-18 VITALS — BP 152/77 | HR 77 | Temp 98.6°F | Resp 20 | Ht 62.0 in | Wt 122.4 lb

## 2013-11-18 DIAGNOSIS — G629 Polyneuropathy, unspecified: Secondary | ICD-10-CM | POA: Insufficient documentation

## 2013-11-18 DIAGNOSIS — Z923 Personal history of irradiation: Secondary | ICD-10-CM | POA: Insufficient documentation

## 2013-11-18 DIAGNOSIS — Z90722 Acquired absence of ovaries, bilateral: Secondary | ICD-10-CM | POA: Diagnosis not present

## 2013-11-18 DIAGNOSIS — C541 Malignant neoplasm of endometrium: Secondary | ICD-10-CM

## 2013-11-18 DIAGNOSIS — Z9221 Personal history of antineoplastic chemotherapy: Secondary | ICD-10-CM | POA: Insufficient documentation

## 2013-11-18 DIAGNOSIS — Z803 Family history of malignant neoplasm of breast: Secondary | ICD-10-CM | POA: Diagnosis not present

## 2013-11-18 DIAGNOSIS — Z08 Encounter for follow-up examination after completed treatment for malignant neoplasm: Secondary | ICD-10-CM | POA: Diagnosis present

## 2013-11-18 DIAGNOSIS — Z79899 Other long term (current) drug therapy: Secondary | ICD-10-CM | POA: Insufficient documentation

## 2013-11-18 DIAGNOSIS — Z8542 Personal history of malignant neoplasm of other parts of uterus: Secondary | ICD-10-CM

## 2013-11-18 DIAGNOSIS — Z90710 Acquired absence of both cervix and uterus: Secondary | ICD-10-CM | POA: Diagnosis not present

## 2013-11-18 LAB — CBC WITH DIFFERENTIAL/PLATELET
BASO%: 1 % (ref 0.0–2.0)
BASOS ABS: 0 10*3/uL (ref 0.0–0.1)
EOS%: 4.9 % (ref 0.0–7.0)
Eosinophils Absolute: 0.2 10*3/uL (ref 0.0–0.5)
HEMATOCRIT: 27.2 % — AB (ref 34.8–46.6)
HEMOGLOBIN: 9 g/dL — AB (ref 11.6–15.9)
LYMPH#: 1.1 10*3/uL (ref 0.9–3.3)
LYMPH%: 22.1 % (ref 14.0–49.7)
MCH: 34.7 pg — AB (ref 25.1–34.0)
MCHC: 33.2 g/dL (ref 31.5–36.0)
MCV: 104.5 fL — ABNORMAL HIGH (ref 79.5–101.0)
MONO#: 0.5 10*3/uL (ref 0.1–0.9)
MONO%: 10.6 % (ref 0.0–14.0)
NEUT#: 2.9 10*3/uL (ref 1.5–6.5)
NEUT%: 61.4 % (ref 38.4–76.8)
Platelets: 286 10*3/uL (ref 145–400)
RBC: 2.6 10*6/uL — ABNORMAL LOW (ref 3.70–5.45)
RDW: 15.8 % — ABNORMAL HIGH (ref 11.2–14.5)
WBC: 4.8 10*3/uL (ref 3.9–10.3)

## 2013-11-18 NOTE — Patient Instructions (Addendum)
Please follow up with Dr. Fermin Schwab in February.

## 2013-11-18 NOTE — Telephone Encounter (Signed)
Message copied by Baruch Merl on Fri Nov 18, 2013  5:31 PM ------      Message from: Gordy Levan      Created: Thu Oct 20, 2013  8:39 PM       Will have CBC on 10-9 when she comes to see Dr Josephina Shih - need to be sure to see it, as hgb 7.7 on 10-19-13            thanks ------

## 2013-11-18 NOTE — Progress Notes (Signed)
Consult Note: Gyn-Onc   Ebony Herring 80 y.o. female  Chief Complaint  Patient presents with  . 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 Clinically free of disease. mild peripheral neuropathy in the feet.  Plan:   We will begin followup at 3 month intervals. The patient's previously scheduled to see Dr. Freddi Che in limbus A. and therefore I'll see the patient again in February 2016.  Interval history: The patient has now completed 6 cycles of carboplatin and Taxol and and  extended field radiation therapy.  Overall she's done well except for fatigue. She does have some mild neuropathy. She's using vitamin B6 twice a day. She has a good appetite is no other GI or GU symptoms. She denies any pelvic pain pressure vaginal bleeding or discharge.  Following completion of therapy she had a CT scan that shows no evidence of disease.  HPI: 80 year old white married female seen in consultation at the request of Dr. Gaetano Net regarding the management of a newly diagnosed high-grade endometrial carcinoma. The patient's history dates back to approximately November when she developed of vaginal discharge. Overtime some blood was noted in the discharge. Ultimately the patient was referred to Dr. Gertie Fey for further evaluation. An endometrial biopsy was obtained on 01/26/2013 showed a high-grade endometrial carcinoma with serous features. Subsequently the patient's had an MRI of the pelvis showing a fibroid uterus and prominent bilateral iliac lymph nodes measuring between 1 cm and 7 mm. Currently the patient is having minimal bleeding and no significant pain.  Past gynecologic history includes a bilateral tubal ligation. Obstetrical history gravida 2.  Patient underwent exploratory laparotomy total abdominal hysterectomy bilateral salpingo-oophorectomy pelvic and para-aortic lymphadenectomy on 03/01/2013. 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 2 of 9 pelvic lymph nodes and 3 of 4 para-aortic lymph nodes were positive  . "Sandwich" therapy using carboplatin and Taxol and extended field radiation therapy was recommended. All therapy was completed in September 2015. At the completion of therapy CT scan was negative.    Review of Systems:10 point review of systems is negative except as noted in interval history.   Vitals: Blood pressure 152/77, pulse 77, temperature 98.6 F (37 C), temperature source Oral, resp. rate 20, height 5\' 2"  (1.575 m), weight 122 lb 6.4 oz (55.52 kg).  Physical Exam: General : The patient is a healthy woman in no acute distress.  HEENT: normocephalic, extraoccular movements normal; neck is supple without thyromegally  Lynphnodes: Supraclavicular and inguinal nodes not enlarged  Abdomen: Soft, non-tender, no ascites, no organomegally, no masses, no hernias she has a well-healed scar at McBurney's point and small incisions from her tubal ligation and laparoscopic cholecystectomy, incision is well-healed. Pelvic:  EGBUS: Normal female  Vagina: Normal, no lesions  Urethra and Bladder: Normal, non-tender  Cervix: Surgically absent Uterus: Surgically absent. Bi-manual examination: Non-tender; no adenxal masses or nodularity  Rectal: normal sphincter tone, no masses, no blood  Lower extremities: No edema or varicosities. Normal range of motion      Allergies  Allergen Reactions  . Bactrim [Sulfamethoxazole-Trimethoprim] Other (See Comments)    Made her see things  . Ciprofloxacin Other (See Comments)    Made her see things    Past Medical History  Diagnosis Date  . Glaucoma     High pressure- ?pre glaucoma  . Cancer     ENDOMETRIAL CANCER  . Radiation 06/01/13-07/07/13    47  gray to pelvis and periaortic region  . History of brachytherapy 07/13/13, 07/20/13, 07/27/13    vaginal cuff boost 18 gray    Past Surgical  History  Procedure Laterality Date  . Cholecystectomy    . Appendectomy    . Abdominal hysterectomy N/A 03/01/2013    Procedure: HYSTERECTOMY ABDOMINAL TOTAL/BILATERAL SALPINGO OOPHORECTOMY/WITH PELVIC AND PARA AORTIC LYMPHADNECTOMY ;  Surgeon: Alvino Chapel, MD;  Location: [facility];  Service: Gynecology;  Laterality: N/A;  . Salpingoophorectomy Bilateral 03/01/2013    Procedure: BILATERAL SALPINGO OOPHORECTOMY;  Surgeon: Alvino Chapel, MD;  Location: [facility];  Service: Gynecology;  Laterality: Bilateral;    Current Outpatient Prescriptions  Medication Sig Dispense Refill  . Brinzolamide-Brimonidine (SIMBRINZA) 1-0.2 % SUSP Place 1 drop into both eyes 2 (two) times daily.       Marland Kitchen dexamethasone (DECADRON) 4 MG tablet Take 5 tabs = 20 mg with food 12 hours and 6 hours prior to Taxol chemotherapy.  10 tablet  2  . lidocaine-prilocaine (EMLA) cream Apply 1 application topically as needed.  30 g  0  . loperamide (IMODIUM) 2 MG capsule 2 mg as needed.       Marland Kitchen LORazepam (ATIVAN) 0.5 MG tablet Take 1 tablet under the tongue or swallow every 6 hours as needed for nausea  20 tablet  0  . ondansetron (ZOFRAN) 8 MG tablet Take 1-2 tablets (8-16 mg total) by mouth every 12 (twelve) hours as needed for nausea or vomiting.  20 tablet  1  . pantoprazole (PROTONIX) 40 MG tablet Take 1 tablet (40 mg total) by mouth daily.  30 tablet  1  . pyridOXINE (VITAMIN B-6) 100 MG tablet Take 1 tablet (100 mg total) by mouth 2 (two) times daily.  100 tablet  1  . ferrous fumarate (HEMOCYTE - 106 MG FE) 325 (106 FE) MG TABS tablet Take 1 tablet (106 mg of iron total) by mouth daily.  30 each  3   No current facility-administered medications for this visit.    History   Social History  . Marital Status: Married    Spouse Name: N/A    Number of Children: 1  . Years of Education: N/A   Occupational History  . Not on file.   Social History Main Topics  . Smoking status: Never Smoker   . Smokeless  tobacco: Never Used  . Alcohol Use: No  . Drug Use: No  . Sexual Activity: Not on file   Other Topics Concern  . Not on file   Social History Narrative  . No narrative on file    Family History  Problem Relation Age of Onset  . Breast cancer Maternal Aunt     x 4      [name],Ebony Marseille L, MD 11/18/2013, 4:22 PM

## 2013-11-18 NOTE — Telephone Encounter (Signed)
Told Ebony Herring the results of cbc. From today 11-18-13. Hgb up to 9.0  Hct. 27.2.  Patient instructed to continue iron daily.

## 2013-12-08 ENCOUNTER — Ambulatory Visit
Admission: RE | Admit: 2013-12-08 | Discharge: 2013-12-08 | Disposition: A | Discharge status: Home / Self Care | Payer: Medicare Other | Source: Ambulatory Visit | Attending: Radiation Oncology | Admitting: Radiation Oncology

## 2013-12-08 ENCOUNTER — Encounter: Payer: Self-pay | Admitting: Radiation Oncology

## 2013-12-08 VITALS — BP 119/71 | HR 79 | Temp 98.3°F | Resp 20 | Ht 62.0 in | Wt 120.5 lb

## 2013-12-08 DIAGNOSIS — C541 Malignant neoplasm of endometrium: Secondary | ICD-10-CM

## 2013-12-08 NOTE — Progress Notes (Signed)
  Radiation Oncology         [phone] ________________________________  Name: Ebony Herring MRN: [id number]  Date: 12/08/2013  DOB: [birth year]  Follow-Up Visit Note  CC: [name], MD  Alycia Rossetti, MD  No diagnosis found.  Diagnosis:   Stage IIIC2 endometrial cancer  Interval Since Last Radiation:  4  months  Narrative:  The patient returns today for routine follow-up.  She is doing well this time. She has problems with diarrhea once every 2 weeks. She denies any rectal bleeding. She denies any vaginal bleeding hematuria patient did see Dr. Fermin Schwab earlier this month with showing no evidence of recurrence. Patient has undergone recent imaging showing no obvious recurrence.                              ALLERGIES:  is allergic to bactrim and ciprofloxacin.  Meds: Current Outpatient Prescriptions  Medication Sig Dispense Refill  . Brinzolamide-Brimonidine (SIMBRINZA) 1-0.2 % SUSP Place 1 drop into both eyes 2 (two) times daily.       . ferrous fumarate (HEMOCYTE - 106 MG FE) 325 (106 FE) MG TABS tablet Take 1 tablet (106 mg of iron total) by mouth daily.  30 each  3  . lidocaine-prilocaine (EMLA) cream Apply 1 application topically as needed.  30 g  0  . loperamide (IMODIUM) 2 MG capsule 2 mg as needed.       . pyridOXINE (VITAMIN B-6) 100 MG tablet Take 1 tablet (100 mg total) by mouth 2 (two) times daily.  100 tablet  1  . dexamethasone (DECADRON) 4 MG tablet Take 5 tabs = 20 mg with food 12 hours and 6 hours prior to Taxol chemotherapy.  10 tablet  2  . LORazepam (ATIVAN) 0.5 MG tablet Take 1 tablet under the tongue or swallow every 6 hours as needed for nausea  20 tablet  0  . ondansetron (ZOFRAN) 8 MG tablet Take 1-2 tablets (8-16 mg total) by mouth every 12 (twelve) hours as needed for nausea or vomiting.  20 tablet  1  . pantoprazole (PROTONIX) 40 MG tablet Take 1 tablet (40 mg total) by mouth daily.  30 tablet  1   No current facility-administered  medications for this encounter.    Physical Findings: The patient is in no acute distress. Patient is alert and oriented.  height is 5\' 2"  (1.575 m) and weight is 120 lb 8 oz (54.658 kg). Her oral temperature is 98.3 F (36.8 C). Her blood pressure is 119/71 and her pulse is 79. Her respiration is 20. Marland Kitchen  No palpable subclavicular or axillary biopsy. Lungs are clear to auscultation. The heart has regular rhythm and rate. The abdomen is soft and nontender with normal bowel sounds. A pelvic exam as not repeated today in light of her recent exam by  gynecologic oncology  Lab Findings: Lab Results  Component Value Date   WBC 4.8 11/18/2013   HGB 9.0* 11/18/2013   HCT 27.2* 11/18/2013   MCV 104.5* 11/18/2013   PLT 286 11/18/2013    Radiographic Findings: Ct Chest W Contrast  Impression:  Clinically stable without evidence of recurrence on recent imaging and clinical exam by a gynecologic oncology  Plan:  Routine followup in May of 2016. In the interim the patient will be seen by gynecologic oncology in February  ____________________________________ Blair Promise, MD

## 2013-12-08 NOTE — Progress Notes (Signed)
Follow up s/p radiation  06/01/13-07/07/13,  External beam radiation therapy, ;then 07/13/13,07/20/13 & 07/27/13 intracavity brachytherapy Patient stated "I had a bout od diarrhea this 530am today,took imodium,no further issues, no  Vaginal bleeding or discharge, no c/o pain, no dysuria or hematuria, appetite good, energy level up to 75%,gets tired easily though, numbness in toes s/p Taxol,  9:31 AM

## 2013-12-23 ENCOUNTER — Other Ambulatory Visit: Payer: Self-pay | Admitting: *Deleted

## 2013-12-23 DIAGNOSIS — C541 Malignant neoplasm of endometrium: Secondary | ICD-10-CM

## 2013-12-25 ENCOUNTER — Other Ambulatory Visit: Payer: Self-pay | Admitting: Oncology

## 2013-12-26 ENCOUNTER — Ambulatory Visit (HOSPITAL_BASED_OUTPATIENT_CLINIC_OR_DEPARTMENT_OTHER): Payer: Medicare Other

## 2013-12-26 ENCOUNTER — Telehealth: Payer: Self-pay | Admitting: Oncology

## 2013-12-26 ENCOUNTER — Ambulatory Visit (HOSPITAL_BASED_OUTPATIENT_CLINIC_OR_DEPARTMENT_OTHER): Payer: Medicare Other | Admitting: Oncology

## 2013-12-26 ENCOUNTER — Other Ambulatory Visit (HOSPITAL_BASED_OUTPATIENT_CLINIC_OR_DEPARTMENT_OTHER): Payer: Medicare Other

## 2013-12-26 ENCOUNTER — Encounter: Payer: Self-pay | Admitting: Oncology

## 2013-12-26 VITALS — BP 128/69 | HR 75 | Temp 98.8°F | Resp 18 | Ht 62.0 in | Wt 119.7 lb

## 2013-12-26 DIAGNOSIS — Z95828 Presence of other vascular implants and grafts: Secondary | ICD-10-CM

## 2013-12-26 DIAGNOSIS — C541 Malignant neoplasm of endometrium: Secondary | ICD-10-CM

## 2013-12-26 DIAGNOSIS — G622 Polyneuropathy due to other toxic agents: Secondary | ICD-10-CM

## 2013-12-26 DIAGNOSIS — T451X5A Adverse effect of antineoplastic and immunosuppressive drugs, initial encounter: Secondary | ICD-10-CM

## 2013-12-26 DIAGNOSIS — G620 Drug-induced polyneuropathy: Secondary | ICD-10-CM

## 2013-12-26 DIAGNOSIS — D6481 Anemia due to antineoplastic chemotherapy: Secondary | ICD-10-CM

## 2013-12-26 LAB — COMPREHENSIVE METABOLIC PANEL (CC13)
ALT: 10 U/L (ref 0–55)
ANION GAP: 8 meq/L (ref 3–11)
AST: 18 U/L (ref 5–34)
Albumin: 3.5 g/dL (ref 3.5–5.0)
Alkaline Phosphatase: 73 U/L (ref 40–150)
BUN: 15.5 mg/dL (ref 7.0–26.0)
CALCIUM: 9.3 mg/dL (ref 8.4–10.4)
CO2: 25 mEq/L (ref 22–29)
Chloride: 106 mEq/L (ref 98–109)
Creatinine: 0.9 mg/dL (ref 0.6–1.1)
Glucose: 90 mg/dl (ref 70–140)
POTASSIUM: 4.3 meq/L (ref 3.5–5.1)
SODIUM: 139 meq/L (ref 136–145)
Total Bilirubin: 0.39 mg/dL (ref 0.20–1.20)
Total Protein: 6.8 g/dL (ref 6.4–8.3)

## 2013-12-26 LAB — CBC WITH DIFFERENTIAL/PLATELET
BASO%: 0.6 % (ref 0.0–2.0)
Basophils Absolute: 0 10*3/uL (ref 0.0–0.1)
EOS ABS: 0.2 10*3/uL (ref 0.0–0.5)
EOS%: 4.6 % (ref 0.0–7.0)
HCT: 29.1 % — ABNORMAL LOW (ref 34.8–46.6)
HGB: 9.4 g/dL — ABNORMAL LOW (ref 11.6–15.9)
LYMPH#: 0.8 10*3/uL — AB (ref 0.9–3.3)
LYMPH%: 15.2 % (ref 14.0–49.7)
MCH: 32.4 pg (ref 25.1–34.0)
MCHC: 32.4 g/dL (ref 31.5–36.0)
MCV: 100.1 fL (ref 79.5–101.0)
MONO#: 0.5 10*3/uL (ref 0.1–0.9)
MONO%: 10 % (ref 0.0–14.0)
NEUT#: 3.8 10*3/uL (ref 1.5–6.5)
NEUT%: 69.6 % (ref 38.4–76.8)
Platelets: 284 10*3/uL (ref 145–400)
RBC: 2.9 10*6/uL — ABNORMAL LOW (ref 3.70–5.45)
RDW: 14 % (ref 11.2–14.5)
WBC: 5.4 10*3/uL (ref 3.9–10.3)

## 2013-12-26 MED ORDER — SODIUM CHLORIDE 0.9 % IJ SOLN
10.0000 mL | INTRAMUSCULAR | Status: DC | PRN
  Administered 2013-12-26: 10 mL via INTRAVENOUS
  Filled 2013-12-26: qty 10

## 2013-12-26 MED ORDER — HEPARIN SOD (PORK) LOCK FLUSH 100 UNIT/ML IV SOLN
500.0000 [IU] | Freq: Once | INTRAVENOUS | Status: AC
  Administered 2013-12-26: 500 [IU] via INTRAVENOUS
  Filled 2013-12-26: qty 5

## 2013-12-26 NOTE — Telephone Encounter (Signed)
, °

## 2013-12-26 NOTE — Patient Instructions (Signed)

## 2013-12-26 NOTE — Progress Notes (Signed)
OFFICE PROGRESS NOTE   12/26/2013   Physicians:Daniel ClarkePearson, [name] F (PCP [facility]), Gery Pray, Everlene Farrier, Donato Heinz (ophth  INTERVAL HISTORY:  Patient is seen, alone for visit, in continuing attention to high grade serous IIIC2 endometrial carcinoma, on observation since completing chemo and radiation 10-03-13. She had restaging CT CAP 11-14-13, and has seen gyn oncology and radiation oncology since then. She will see Dr Josephina Shih next in Feb 2016 and Dr Sondra Come in May 2016.   Patient has felt fairly well since she was seen last, with peripheral neuropathy improved in feet, just residual now in tips of toes; she has no peripheral neuropathy in hands. She tires after standing ~ 30 min to cook or to do housework. Taste is still not correct, tho she can taste chocolate and strong flavors. She has had no bleeding. She denies abdominal or pelvic pain and is not SOB with somewhat limited activity.   She has [name], flushed today She had flu vaccine   ONCOLOGIC HISTORY Patient presented to PCP with vaginal discharge with some bleeding since Nov 2014. 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 MRI pelvis 01-20-13 Had uterine mass and prominent bilateral iliac nodes. She had TAH BSO and pelvic and para-aortic lymphadenectomy by Dr Josephina Shih on 03-01-2013. Pathology [phone]) had high grade serous carcinoma of endometrium extending to outer half of myometrium, with extensive angiolymphatic invasion, involvement of right ovary, involvement of 2/9 pelvic nodes and 3/4 para-aortic nodes. At completion of surgery there was no gross residual disease. She had 3 cycles of taxol carboplatin from 03-28-13 thru 05-09-2013, with neupogen support. She had pelvic RT 45 Gy thru ~ 07-05-13 and HDR x4 thru 07-27-13. Chemo resumed with taxol carboplatin cycle 4 on 08-22-13, with neupogen support, for total  6 cycles thru 10-03-13. CT CAP 11-14-13 showed no definite concerns for recurrent or metastatic gyn cancer.  Review of systems as above, also: No bone pain. Appetite ok other than taste problems. Bowels moving regularly. No bleeding. Remainder of 10 point Review of Systems negative.  Objective:  Vital signs in last 24 hours:  BP 128/69 mmHg  Pulse 75  Temp(Src) 98.8 F (37.1 C) (Oral)  Resp 18  Ht 5\' 2"  (1.575 m)  Wt 119 lb 11.2 oz (54.296 kg)  BMI 21.89 kg/m2 Weight down 1 lb.Alert, oriented and appropriate. Ambulatory without difficulty.    HEENT:PERRL, sclerae not icteric. Oral mucosa moist without lesions, posterior pharynx clear.  Neck supple. No JVD.  Lymphatics:no cervical,supraclavicular or inguinal adenopathy Resp: clear to auscultation bilaterally and normal percussion bilaterally Cardio: regular rate and rhythm. No gallop. GI: soft, nontender, not distended, no mass or organomegaly. Normally active bowel sounds. Surgical incision not remarkable. Musculoskeletal/ Extremities: without pitting edema, cords, tenderness Neuro: no peripheral neuropathy. Otherwise nonfocal Skin without rash, ecchymosis, petechiae Portacath-without erythema or tenderness  Lab Results:  Results for orders placed or performed in visit on 12/26/13  CBC with Differential  Result Value Ref Range   WBC 5.4 3.9 - 10.3 10e3/uL   NEUT# 3.8 1.5 - 6.5 10e3/uL   HGB 9.4 (L) 11.6 - 15.9 g/dL   HCT 29.1 (L) 34.8 - 46.6 %   Platelets 284 145 - 400 10e3/uL   MCV 100.1 79.5 - 101.0 fL   MCH 32.4 25.1 - 34.0 pg   MCHC 32.4 31.5 - 36.0 g/dL   RBC 2.90 (L) 3.70 - 5.45 10e6/uL   RDW 14.0 11.2 -  14.5 %   lymph# 0.8 (L) 0.9 - 3.3 10e3/uL   MONO# 0.5 0.1 - 0.9 10e3/uL   Eosinophils Absolute 0.2 0.0 - 0.5 10e3/uL   Basophils Absolute 0.0 0.0 - 0.1 10e3/uL   NEUT% 69.6 38.4 - 76.8 %   LYMPH% 15.2 14.0 - 49.7 %   MONO% 10.0 0.0 - 14.0 %   EOS% 4.6 0.0 - 7.0 %   BASO% 0.6 0.0 - 2.0 %  Comprehensive  metabolic panel  Result Value Ref Range   Sodium 139 136 - 145 mEq/L   Potassium 4.3 3.5 - 5.1 mEq/L   Chloride 106 98 - 109 mEq/L   CO2 25 22 - 29 mEq/L   Glucose 90 70 - 140 mg/dl   BUN 15.5 7.0 - 26.0 mg/dL   Creatinine 0.9 0.6 - 1.1 mg/dL   Total Bilirubin 0.39 0.20 - 1.20 mg/dL   Alkaline Phosphatase 73 40 - 150 U/L   AST 18 5 - 34 U/L   ALT 10 0 - 55 U/L   Total Protein 6.8 6.4 - 8.3 g/dL   Albumin 3.5 3.5 - 5.0 g/dL   Calcium 9.3 8.4 - 10.4 mg/dL   Anion Gap 8 3 - 11 mEq/L     Studies/Results: 11-14-2013 CT CHEST FINDINGS  Heart: Heart size is normal. No significant coronary artery calcification. A right-sided Port-A-Cath tip is in the lower superior vena cava. No pericardial effusion.  Vascular structures: Bovine arch anatomy. Atherosclerotic calcification of the aortic arch. Grossly normal opacification of the pulmonary arteries.  Mediastinum/thyroid: There are small mediastinal lymph nodes, nonspecific in appearance. The largest in the prevascular region measure 6 mm in short axis dimension. Large hiatal hernia is present. The visualized portion of the thyroid gland has a normal appearance.  Lungs/Airways: The airways are patent. Small calcified pulmonary nodule is identified within the right middle lobe on image 30 of series 5. No noncalcified suspicious non nodules are identified. No pleural effusions or consolidations identified.  Chest wall/osseous structures: Right-sided Port-A-Cath. No suspicious osseous lesions.  CT ABDOMEN AND PELVIS FINDINGS  Upper abdomen: Within the liver there are at least 5 low-attenuation lesions, each associated with peripheral nodular enhancement and consistent with benign hemangiomata. The largest is seen within the posterior segment of the right hepatic lobe and measures 2.4 cm. No suspicious liver lesions are identified. No focal abnormality identified within the spleen, pancreas, adrenal glands, or kidneys. The  gallbladder surgically absent.  Bowel: Large hiatal hernia is present without evidence for obstruction. Small bowel loops are normal in appearance. Prior appendectomy. There is slight wall thickening of the rectosigmoid loops and question of mass versus benign asymmetry of the rectal sphincter.  Pelvis: The uterus is absent. No adnexal mass. Trace amount of free pelvic fluid.  Retroperitoneum: Surgical clips are identified in the retroperitoneum. No retroperitoneal or mesenteric adenopathy.  Abdominal wall: Postoperative changes are identified in the anterior abdominal wall.  Osseous structures: There is focal sclerotic lesion within the right iliac wing which measures 4.0 cm. No other suspicious lytic or blastic lesions are identified.  IMPRESSION: 1. No evidence for acute abnormality or metastatic disease of the chest. 2. Small calcified granuloma within the right middle lobe. 3. Right-sided Port-A-Cath. 4. Benign hemangiomata within the liver. 5. Large hiatal hernia. 6. Asymmetric appearance of the rectum warrants further evaluation to exclude a lesion. Correlation with physical exam and endoscopy recommended. 7. Sclerotic lesion within the right iliac wing warrants further characterization. MRI is recommended to exclude metastasis.  Medications: I have reviewed the patient's current medications. Continue oral iron, which she is tolerating without difficulty  DISCUSSION: have told patient that hemoglobin is a little better but still lower than normal, likely accounting for some of her fatigue, but not to point that she needs transfusion. She agrees to continuing oral iron, is aware that she may need this for several more months. She understands that we usually keep [name] in place for at least several months after completing treatment, in case it is needed again; she agrees to keeping this now and to having flushes done at this office every 6-8 weeks. We are pleased that  the peripheral neuropathy is improving. DIscussed alternating visits with oncology specialists.  Assessment/Plan:  1.IIIC2 high grade serous endometrial carcinoma: surgery 03-01-13, 3 cycles of taxol carboplatin 03-28-13 thru 05-09-13 with gCSF required, pelvic RT + HDR by Dr Sondra Come completed 07-27-13, and 3 additional cycles of taxol and carboplatin completed 10-03-13. No known active disease, follow up as above + this MD ~ July 2016. 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 Continue to exercise and massage feet, follow 3.[name] in. Very high risk disease, patient agrees for now to keep [name] in. Flush here q 6-8 weeks. 4. ARF with RT diarrhea/ dehydration in June 2015, resolved. Creatinine 0.9 5.anemia: related to chemo and RT. A little better, plan as above. Other counts good. 6.no mammograms in years, no colonoscopy. She does not like medical interventions. 7.glaucoma 8.flu vaccine done  All questions answered; she seems to understand discussion and is in agreement with plan.  Cc this note to PCP. Time spent 25 min including >50% counseling and coordination of care.  Keara Pagliarulo P, MD   12/26/2013, 10:52 AM

## 2013-12-27 ENCOUNTER — Encounter: Payer: Self-pay | Admitting: Oncology

## 2013-12-27 DIAGNOSIS — D6481 Anemia due to antineoplastic chemotherapy: Secondary | ICD-10-CM | POA: Insufficient documentation

## 2013-12-27 DIAGNOSIS — G620 Drug-induced polyneuropathy: Secondary | ICD-10-CM | POA: Insufficient documentation

## 2013-12-27 DIAGNOSIS — T451X5A Adverse effect of antineoplastic and immunosuppressive drugs, initial encounter: Secondary | ICD-10-CM

## 2014-01-16 ENCOUNTER — Telehealth: Payer: Self-pay | Admitting: *Deleted

## 2014-01-16 NOTE — Telephone Encounter (Signed)
Patient called this morning stating she is having pain around the bottom of her stomach. She had diarrhea for 4 days and then dry heaves - these both have resolved now. Pt denies having any vaginal bleeding or GYN problems. Per Joylene John, NP patient needs to see her PCP in regards to these symptoms. Called and spoke with pt and let her know this. Pt agreeable to call her PCP.

## 2014-01-17 ENCOUNTER — Encounter: Payer: Self-pay | Admitting: Family Medicine

## 2014-01-17 ENCOUNTER — Ambulatory Visit (INDEPENDENT_AMBULATORY_CARE_PROVIDER_SITE_OTHER): Payer: Medicare Other | Admitting: Family Medicine

## 2014-01-17 VITALS — BP 122/62 | HR 72 | Temp 98.7°F | Resp 16 | Ht 62.0 in | Wt 115.0 lb

## 2014-01-17 DIAGNOSIS — R05 Cough: Secondary | ICD-10-CM

## 2014-01-17 DIAGNOSIS — R059 Cough, unspecified: Secondary | ICD-10-CM

## 2014-01-17 DIAGNOSIS — R197 Diarrhea, unspecified: Secondary | ICD-10-CM

## 2014-01-17 DIAGNOSIS — B349 Viral infection, unspecified: Secondary | ICD-10-CM

## 2014-01-17 LAB — CBC W/MCH & 3 PART DIFF
HCT: 30.1 % — ABNORMAL LOW (ref 36.0–46.0)
Hemoglobin: 10.5 g/dL — ABNORMAL LOW (ref 12.0–15.0)
Lymphocytes Relative: 17 % (ref 12–46)
Lymphs Abs: 1.3 10*3/uL (ref 0.7–4.0)
MCH: 32.7 pg (ref 26.0–34.0)
MCHC: 34.9 g/dL (ref 30.0–36.0)
MCV: 93.8 fL (ref 78.0–100.0)
NEUTROS ABS: 5.9 10*3/uL (ref 1.7–7.7)
Neutrophils Relative %: 77 % (ref 43–77)
Platelets: 407 10*3/uL — ABNORMAL HIGH (ref 150–400)
RBC: 3.21 MIL/uL — ABNORMAL LOW (ref 3.87–5.11)
RDW: 14 % (ref 11.5–15.5)
WBC mixed population %: 6 % (ref 3–18)
WBC mixed population: 0.5 10*3/uL (ref 0.1–1.8)
WBC: 7.7 10*3/uL (ref 4.0–10.5)

## 2014-01-17 LAB — INFLUENZA A AND B
INFLUENZA B AG: NEGATIVE
Inflenza A Ag: NEGATIVE

## 2014-01-17 NOTE — Progress Notes (Signed)
Patient ID: Ebony Herring, female   DOB: Jun 23, 1931, 80 y.o.   MRN: [id number]   Subjective:    Patient ID: Ebony Herring, female    DOB: November 11, 1931, 80 y.o.   MRN: [id number]  Patient presents for Illness  patient here with sinus pressure with no drainage hacking cough fatigue and decreased appetite for the past 5 days. She's also had some nausea but no emesis as well as a couple episodes of diarrhea NB. She is status post chemotherapy completed a couple weeks ago. Not had any fever. She has lost about 4 pounds in the past month. She states that she was outside before Thanksgiving with her family and then noticed the symptoms starting to come on slowly throughout the weekend. She is not taking any over-the-counter medications.    Review Of Systems:  GEN- + fatigue, fever, weight loss,weakness, recent illness HEENT- denies eye drainage, change in vision, nasal discharge, CVS- denies chest pain, palpitations RESP- denies SOB, +cough, wheeze ABD- + N/V,  +change in stools, abd pain GU- denies dysuria, hematuria, dribbling, incontinence MSK- denies joint pain, muscle aches, injury Neuro- denies headache, dizziness, syncope, seizure activity       Objective:    BP 122/62 mmHg  Pulse 72  Temp(Src) 98.7 F (37.1 C) (Oral)  Resp 16  Ht 5\' 2"  (1.575 m)  Wt 115 lb (52.164 kg)  BMI 21.03 kg/m2 GEN- NAD, alert and oriented x3, non toxic appearing HEENT- PERRL, EOMI, non injected sclera, pink conjunctiva, MMM, oropharynx clear, no maxillary sinus tenderness, nares clear Neck- Supple, no LAD CVS- RRR, no murmur RESP-CTAB ABD-NABS,soft,NT,ND, no CVA tenderness EXT- No edema Pulses- Radial 2+ Flu neg         Assessment & Plan:      Problem List Items Addressed This Visit      Unprioritized   Diarrhea- viral associated now only 1-2 times a day, immodium if needed   Relevant Orders      Comprehensive metabolic panel    Other Visit Diagnoses    Cough    -  Primary    Relevant  Orders       Influenza a and b (Completed)       CBC w/MCH & 3 Part Diff (Completed)    Viral illness         I think this is more viral illness based on combination of symptoms.flu neg, CBC reassuring, will treat with zofran, push fluids, mucinex, if she declines start antibiotics -Augmentin       Note: This dictation was prepared with Dragon dictation along with smaller phrase technology. Any transcriptional errors that result from this process are unintentional.

## 2014-01-17 NOTE — Patient Instructions (Addendum)
Take your nausea medication as prescribed Plenty of fluids Eat small meals Take mucinex DM for the sinuses Call if you get fever or cough gets worse

## 2014-01-18 LAB — COMPREHENSIVE METABOLIC PANEL
ALT: 13 U/L (ref 0–35)
AST: 18 U/L (ref 0–37)
Albumin: 4.4 g/dL (ref 3.5–5.2)
Alkaline Phosphatase: 70 U/L (ref 39–117)
BUN: 30 mg/dL — AB (ref 6–23)
CALCIUM: 8.1 mg/dL — AB (ref 8.4–10.5)
CHLORIDE: 98 meq/L (ref 96–112)
CO2: 24 mEq/L (ref 19–32)
Creat: 1.63 mg/dL — ABNORMAL HIGH (ref 0.50–1.10)
GLUCOSE: 109 mg/dL — AB (ref 70–99)
POTASSIUM: 3.7 meq/L (ref 3.5–5.3)
Sodium: 136 mEq/L (ref 135–145)
Total Bilirubin: 0.4 mg/dL (ref 0.2–1.2)
Total Protein: 6.9 g/dL (ref 6.0–8.3)

## 2014-01-19 ENCOUNTER — Other Ambulatory Visit: Payer: Self-pay | Admitting: *Deleted

## 2014-01-19 DIAGNOSIS — E860 Dehydration: Secondary | ICD-10-CM

## 2014-01-23 ENCOUNTER — Other Ambulatory Visit: Payer: Medicare Other

## 2014-01-23 DIAGNOSIS — E860 Dehydration: Secondary | ICD-10-CM

## 2014-01-23 LAB — COMPLETE METABOLIC PANEL WITH GFR
ALK PHOS: 74 U/L (ref 39–117)
ALT: 16 U/L (ref 0–35)
AST: 25 U/L (ref 0–37)
Albumin: 3.8 g/dL (ref 3.5–5.2)
BILIRUBIN TOTAL: 0.3 mg/dL (ref 0.2–1.2)
BUN: 20 mg/dL (ref 6–23)
CO2: 23 mEq/L (ref 19–32)
CREATININE: 1.26 mg/dL — AB (ref 0.50–1.10)
Calcium: 7.3 mg/dL — ABNORMAL LOW (ref 8.4–10.5)
Chloride: 101 mEq/L (ref 96–112)
GFR, Est African American: 46 mL/min — ABNORMAL LOW
GFR, Est Non African American: 40 mL/min — ABNORMAL LOW
Glucose, Bld: 86 mg/dL (ref 70–99)
Potassium: 3.9 mEq/L (ref 3.5–5.3)
SODIUM: 135 meq/L (ref 135–145)
TOTAL PROTEIN: 6 g/dL (ref 6.0–8.3)

## 2014-02-14 ENCOUNTER — Other Ambulatory Visit: Payer: Self-pay | Admitting: *Deleted

## 2014-02-14 DIAGNOSIS — G629 Polyneuropathy, unspecified: Secondary | ICD-10-CM

## 2014-02-14 MED ORDER — VITAMIN B-6 100 MG PO TABS: 100.0000 mg | ORAL_TABLET | Freq: Two times a day (BID) | ORAL | Status: DC

## 2014-02-17 ENCOUNTER — Other Ambulatory Visit: Payer: Self-pay

## 2014-02-17 DIAGNOSIS — D509 Iron deficiency anemia, unspecified: Secondary | ICD-10-CM

## 2014-02-17 DIAGNOSIS — C541 Malignant neoplasm of endometrium: Secondary | ICD-10-CM

## 2014-02-17 MED ORDER — FERROUS FUMARATE 325 (106 FE) MG PO TABS: 1.0000 | ORAL_TABLET | Freq: Every day | ORAL | Status: DC

## 2014-03-17 ENCOUNTER — Other Ambulatory Visit (HOSPITAL_BASED_OUTPATIENT_CLINIC_OR_DEPARTMENT_OTHER): Payer: Medicare Other

## 2014-03-17 ENCOUNTER — Ambulatory Visit: Payer: Medicare Other

## 2014-03-17 ENCOUNTER — Ambulatory Visit: Payer: Medicare Other | Attending: Gynecology | Admitting: Gynecology

## 2014-03-17 ENCOUNTER — Encounter: Payer: Self-pay | Admitting: Gynecology

## 2014-03-17 VITALS — BP 138/83 | HR 78 | Temp 98.1°F | Resp 18 | Ht 62.0 in | Wt 120.8 lb

## 2014-03-17 DIAGNOSIS — C541 Malignant neoplasm of endometrium: Secondary | ICD-10-CM | POA: Diagnosis not present

## 2014-03-17 DIAGNOSIS — Z8543 Personal history of malignant neoplasm of ovary: Secondary | ICD-10-CM | POA: Insufficient documentation

## 2014-03-17 DIAGNOSIS — Z8542 Personal history of malignant neoplasm of other parts of uterus: Secondary | ICD-10-CM | POA: Insufficient documentation

## 2014-03-17 DIAGNOSIS — Z923 Personal history of irradiation: Secondary | ICD-10-CM | POA: Insufficient documentation

## 2014-03-17 DIAGNOSIS — Z8589 Personal history of malignant neoplasm of other organs and systems: Secondary | ICD-10-CM | POA: Diagnosis not present

## 2014-03-17 DIAGNOSIS — Z95828 Presence of other vascular implants and grafts: Secondary | ICD-10-CM

## 2014-03-17 DIAGNOSIS — Z90710 Acquired absence of both cervix and uterus: Secondary | ICD-10-CM | POA: Insufficient documentation

## 2014-03-17 DIAGNOSIS — Z9049 Acquired absence of other specified parts of digestive tract: Secondary | ICD-10-CM | POA: Diagnosis not present

## 2014-03-17 DIAGNOSIS — G629 Polyneuropathy, unspecified: Secondary | ICD-10-CM | POA: Insufficient documentation

## 2014-03-17 DIAGNOSIS — Z90722 Acquired absence of ovaries, bilateral: Secondary | ICD-10-CM | POA: Insufficient documentation

## 2014-03-17 DIAGNOSIS — Z9221 Personal history of antineoplastic chemotherapy: Secondary | ICD-10-CM | POA: Insufficient documentation

## 2014-03-17 DIAGNOSIS — Z08 Encounter for follow-up examination after completed treatment for malignant neoplasm: Secondary | ICD-10-CM | POA: Diagnosis not present

## 2014-03-17 LAB — CBC WITH DIFFERENTIAL/PLATELET
BASO%: 0.2 % (ref 0.0–2.0)
Basophils Absolute: 0 10*3/uL (ref 0.0–0.1)
EOS ABS: 0.1 10*3/uL (ref 0.0–0.5)
EOS%: 2.1 % (ref 0.0–7.0)
HCT: 30.8 % — ABNORMAL LOW (ref 34.8–46.6)
HEMOGLOBIN: 10 g/dL — AB (ref 11.6–15.9)
LYMPH#: 1.2 10*3/uL (ref 0.9–3.3)
LYMPH%: 23.7 % (ref 14.0–49.7)
MCH: 31.4 pg (ref 25.1–34.0)
MCHC: 32.5 g/dL (ref 31.5–36.0)
MCV: 96.9 fL (ref 79.5–101.0)
MONO#: 0.5 10*3/uL (ref 0.1–0.9)
MONO%: 8.7 % (ref 0.0–14.0)
NEUT#: 3.4 10*3/uL (ref 1.5–6.5)
NEUT%: 65.3 % (ref 38.4–76.8)
PLATELETS: 253 10*3/uL (ref 145–400)
RBC: 3.18 10*6/uL — ABNORMAL LOW (ref 3.70–5.45)
RDW: 14.3 % (ref 11.2–14.5)
WBC: 5.2 10*3/uL (ref 3.9–10.3)

## 2014-03-17 MED ORDER — HEPARIN SOD (PORK) LOCK FLUSH 100 UNIT/ML IV SOLN
500.0000 [IU] | Freq: Once | INTRAVENOUS | Status: AC
  Administered 2014-03-17: 500 [IU] via INTRAVENOUS
  Filled 2014-03-17: qty 5

## 2014-03-17 MED ORDER — SODIUM CHLORIDE 0.9 % IJ SOLN
10.0000 mL | INTRAMUSCULAR | Status: DC | PRN
  Administered 2014-03-17: 10 mL via INTRAVENOUS
  Filled 2014-03-17: qty 10

## 2014-03-17 NOTE — Patient Instructions (Signed)
Followup with Dr. Fermin Schwab in November 2016. Please call us in June or July to schedule this appt. Please call us sooner with any questions or concerns you have.

## 2014-03-17 NOTE — Patient Instructions (Signed)

## 2014-03-17 NOTE — Progress Notes (Signed)
Consult Note: [facility]   Ebony Herring 81 y.o. female  Chief Complaint  Patient presents with  . 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 Clinically free of disease. mild peripheral neuropathy in the feet.  Plan:   The patient schedule see Ebony Herring at approximate 3 months. Thereafter she should see Dr. Marko Herring in 3 months and 3 months later see Ebony Herring. The patient will contact us if she has any symptoms of possible recurrent disease.  Interval history: The patient returns today for continuing follow-up. She reports she's been doing well with no GI or GU symptoms. She denies any pelvic pain pressure vaginal bleeding or discharge. She has good functional status although she does note mild peripheral neuropathy in her feet which is gradually gotten slightly better. She is taking vitamin B 6.    HPI: 81 year old white married female seen in consultation at the request of Dr. Gaetano Herring regarding the management of a newly diagnosed high-grade endometrial carcinoma. The patient's history dates back to approximately November when she developed of vaginal discharge. Overtime some blood was noted in the discharge. Ultimately the patient was referred to Dr. Gertie Herring for further evaluation. An endometrial biopsy was obtained on 01/26/2013 showed a high-grade endometrial carcinoma with serous features. Subsequently the patient's had an MRI of the pelvis showing a fibroid uterus and prominent bilateral iliac lymph nodes measuring between 1 cm and 7 mm. Currently the patient is having minimal bleeding and no significant pain.  Past gynecologic history includes a bilateral tubal ligation. Obstetrical history gravida 2.  Patient underwent exploratory laparotomy total abdominal hysterectomy bilateral salpingo-oophorectomy pelvic and para-aortic lymphadenectomy on 03/01/2013. Final pathology showed a invasive high-grade serous carcinoma with extensive  lymphatic invasion, metastasis to the right ovary. 2 of 9 pelvic lymph nodes and 3 of 4 para-aortic lymph nodes were positive  . "Sandwich" therapy using carboplatin and Taxol and extended field radiation therapy was recommended. All therapy was completed in September 2015. At the completion of therapy CT scan was negative.    Review of Systems:10 point review of systems is negative except as noted in interval history.   Vitals: Blood pressure 138/83, pulse 78, temperature 98.1 F (36.7 C), temperature source Oral, resp. rate 18, height 5\' 2"  (1.575 m), weight 120 lb 12.8 oz (54.795 kg).  Physical Exam: General : The patient is a healthy woman in no acute distress.  HEENT: normocephalic, extraoccular movements normal; neck is supple without thyromegally  Lynphnodes: Supraclavicular and inguinal nodes not enlarged  Abdomen: Soft, non-tender, no ascites, no organomegally, no masses, no hernias she has a well-healed scar at McBurney's point and small incisions from her tubal ligation and laparoscopic cholecystectomy, incision is well-healed. Pelvic:  EGBUS: Normal female  Vagina: Normal, no lesions  Urethra and Bladder: Normal, non-tender  Cervix: Surgically absent Uterus: Surgically absent. Bi-manual examination: Non-tender; no adenxal masses or nodularity  Rectal: normal sphincter tone, no masses, no blood  Lower extremities: No edema or varicosities. Normal range of motion      Allergies  Allergen Reactions  . Bactrim [Sulfamethoxazole-Trimethoprim] Other (See Comments)    Made her see things  . Ciprofloxacin Other (See Comments)    Made her see things    Past Medical History  Diagnosis Date  . Glaucoma     High pressure- ?pre glaucoma  . Cancer     ENDOMETRIAL CANCER  . Radiation 06/01/13-07/07/13    45 gray to pelvis and periaortic region  . History  of brachytherapy 07/13/13, 07/20/13, 07/27/13    vaginal cuff boost 18 gray    Past Surgical History  Procedure Laterality  Date  . Cholecystectomy    . Appendectomy    . Abdominal hysterectomy N/A 03/01/2013    Procedure: HYSTERECTOMY ABDOMINAL TOTAL/BILATERAL SALPINGO OOPHORECTOMY/WITH PELVIC AND PARA AORTIC LYMPHADNECTOMY ;  Surgeon: Ebony Chapel, MD;  Location: [facility];  Service: Gynecology;  Laterality: N/A;  . Salpingoophorectomy Bilateral 03/01/2013    Procedure: BILATERAL SALPINGO OOPHORECTOMY;  Surgeon: Ebony Chapel, MD;  Location: [facility];  Service: Gynecology;  Laterality: Bilateral;    Current Outpatient Prescriptions  Medication Sig Dispense Refill  . Brinzolamide-Brimonidine (SIMBRINZA) 1-0.2 % SUSP Place 1 drop into both eyes 2 (two) times daily.     . ferrous fumarate (HEMOCYTE - 106 MG FE) 325 (106 FE) MG TABS tablet Take 1 tablet (106 mg of iron total) by mouth daily. 30 each 3  . lidocaine-prilocaine (EMLA) cream Apply 1 application topically as needed. 30 g 0  . pyridOXINE (VITAMIN B-6) 100 MG tablet Take 1 tablet (100 mg total) by mouth 2 (two) times daily. 100 tablet 3  . loperamide (IMODIUM) 2 MG capsule 2 mg as needed.     Marland Kitchen LORazepam (ATIVAN) 0.5 MG tablet Take 1 tablet under the tongue or swallow every 6 hours as needed for nausea (Patient not taking: Reported on 03/17/2014) 20 tablet 0  . ondansetron (ZOFRAN) 8 MG tablet Take 1-2 tablets (8-16 mg total) by mouth every 12 (twelve) hours as needed for nausea or vomiting. (Patient not taking: Reported on 03/17/2014) 20 tablet 1   No current facility-administered medications for this visit.    History   Social History  . Marital Status: Married    Spouse Name: N/A    Number of Children: 1  . Years of Education: N/A   Occupational History  . Not on file.   Social History Main Topics  . Smoking status: Never Smoker   . Smokeless tobacco: Never Used  . Alcohol Use: No  . Drug Use: No  . Sexual Activity: No   Other Topics Concern  . Not on file   Social History Narrative    Family History  Problem Relation  Age of Onset  . Breast cancer Maternal Aunt     x 4      Ebony Jaskulski L, MD 03/17/2014, 1:40 PM

## 2014-03-21 ENCOUNTER — Telehealth: Payer: Self-pay | Admitting: *Deleted

## 2014-03-21 ENCOUNTER — Other Ambulatory Visit: Payer: Self-pay | Admitting: Oncology

## 2014-03-21 ENCOUNTER — Telehealth: Payer: Self-pay | Admitting: Oncology

## 2014-03-21 NOTE — Telephone Encounter (Signed)
-----   Message from Gordy Levan, MD sent at 03/21/2014  8:11 AM EST ----- Labs seen and need follow up: please let her know cbc is stable, still mild anemia. Remind her about [name] flush every 8 weeks if not otherwise used

## 2014-03-21 NOTE — Telephone Encounter (Signed)
Called patient with CBC results as noted below by Dr. Marko Plume. Patient agreeable to every 8 week [name] flushes. Next appt scheduled in April 2016 and patient states she has that date on her calendar.

## 2014-03-21 NOTE — Telephone Encounter (Signed)
, °

## 2014-03-30 ENCOUNTER — Telehealth: Payer: Self-pay | Admitting: Oncology

## 2014-05-18 ENCOUNTER — Other Ambulatory Visit: Payer: Self-pay | Admitting: *Deleted

## 2014-05-18 DIAGNOSIS — C541 Malignant neoplasm of endometrium: Secondary | ICD-10-CM

## 2014-05-19 ENCOUNTER — Telehealth: Payer: Self-pay

## 2014-05-19 ENCOUNTER — Other Ambulatory Visit (HOSPITAL_BASED_OUTPATIENT_CLINIC_OR_DEPARTMENT_OTHER): Payer: Medicare Other

## 2014-05-19 ENCOUNTER — Ambulatory Visit: Payer: Medicare Other

## 2014-05-19 VITALS — BP 142/72 | HR 71 | Temp 98.5°F | Resp 17

## 2014-05-19 DIAGNOSIS — C541 Malignant neoplasm of endometrium: Secondary | ICD-10-CM

## 2014-05-19 DIAGNOSIS — Z95828 Presence of other vascular implants and grafts: Secondary | ICD-10-CM

## 2014-05-19 LAB — CBC WITH DIFFERENTIAL/PLATELET
BASO%: 0.5 % (ref 0.0–2.0)
Basophils Absolute: 0 10*3/uL (ref 0.0–0.1)
EOS%: 3.9 % (ref 0.0–7.0)
Eosinophils Absolute: 0.2 10*3/uL (ref 0.0–0.5)
HEMATOCRIT: 30.5 % — AB (ref 34.8–46.6)
HEMOGLOBIN: 9.8 g/dL — AB (ref 11.6–15.9)
LYMPH%: 21.9 % (ref 14.0–49.7)
MCH: 31.1 pg (ref 25.1–34.0)
MCHC: 32.2 g/dL (ref 31.5–36.0)
MCV: 96.6 fL (ref 79.5–101.0)
MONO#: 0.5 10*3/uL (ref 0.1–0.9)
MONO%: 8.3 % (ref 0.0–14.0)
NEUT#: 3.6 10*3/uL (ref 1.5–6.5)
NEUT%: 65.4 % (ref 38.4–76.8)
PLATELETS: 290 10*3/uL (ref 145–400)
RBC: 3.15 10*6/uL — ABNORMAL LOW (ref 3.70–5.45)
RDW: 14.8 % — ABNORMAL HIGH (ref 11.2–14.5)
WBC: 5.5 10*3/uL (ref 3.9–10.3)
lymph#: 1.2 10*3/uL (ref 0.9–3.3)

## 2014-05-19 LAB — COMPREHENSIVE METABOLIC PANEL (CC13)
ALBUMIN: 3.6 g/dL (ref 3.5–5.0)
ALT: 15 U/L (ref 0–55)
ANION GAP: 12 meq/L — AB (ref 3–11)
AST: 24 U/L (ref 5–34)
Alkaline Phosphatase: 59 U/L (ref 40–150)
BILIRUBIN TOTAL: 0.27 mg/dL (ref 0.20–1.20)
BUN: 16.8 mg/dL (ref 7.0–26.0)
CO2: 22 meq/L (ref 22–29)
Calcium: 8.6 mg/dL (ref 8.4–10.4)
Chloride: 110 mEq/L — ABNORMAL HIGH (ref 98–109)
Creatinine: 1 mg/dL (ref 0.6–1.1)
EGFR: 55 mL/min/{1.73_m2} — AB (ref >90)
GLUCOSE: 90 mg/dL (ref 70–140)
POTASSIUM: 4.6 meq/L (ref 3.5–5.1)
Sodium: 144 mEq/L (ref 136–145)
Total Protein: 6.4 g/dL (ref 6.4–8.3)

## 2014-05-19 MED ORDER — HEPARIN SOD (PORK) LOCK FLUSH 100 UNIT/ML IV SOLN
500.0000 [IU] | Freq: Once | INTRAVENOUS | Status: AC
  Administered 2014-05-19: 500 [IU] via INTRAVENOUS
  Filled 2014-05-19: qty 5

## 2014-05-19 MED ORDER — SODIUM CHLORIDE 0.9 % IJ SOLN
10.0000 mL | INTRAMUSCULAR | Status: DC | PRN
  Administered 2014-05-19: 10 mL via INTRAVENOUS
  Filled 2014-05-19: qty 10

## 2014-05-19 NOTE — Patient Instructions (Signed)

## 2014-05-19 NOTE — Telephone Encounter (Signed)
Told Ms. Ebony Herring that she is still a little anemic and noted below by Dr. Marko Plume. She states that she is not SOB She can do most activities she wants to engage in.  She will get tired if she over does it.  Told her to call if she has any SOB or increased fatigue prior to May's appointment.  She verbalized understanding.

## 2014-05-19 NOTE — Telephone Encounter (Signed)
-----   Message from Gordy Levan, MD sent at 05/19/2014  1:23 PM EDT ----- Labs seen and need follow up  Please let her know still a little anemic. If very symptomatic please let me know, otherwise continue oral iron and I will check again at my visit in May

## 2014-06-08 ENCOUNTER — Encounter: Payer: Self-pay | Admitting: Radiation Oncology

## 2014-06-08 ENCOUNTER — Ambulatory Visit
Admission: RE | Admit: 2014-06-08 | Discharge: 2014-06-08 | Disposition: A | Discharge status: Home / Self Care | Payer: Medicare Other | Source: Ambulatory Visit | Attending: Radiation Oncology | Admitting: Radiation Oncology

## 2014-06-08 ENCOUNTER — Other Ambulatory Visit: Payer: Self-pay | Admitting: Oncology

## 2014-06-08 ENCOUNTER — Other Ambulatory Visit (HOSPITAL_COMMUNITY)
Admission: RE | Admit: 2014-06-08 | Discharge: 2014-06-08 | Disposition: A | Discharge status: Home / Self Care | Payer: Medicare Other | Source: Ambulatory Visit | Attending: Radiation Oncology | Admitting: Radiation Oncology

## 2014-06-08 VITALS — BP 138/78 | HR 77 | Temp 98.2°F | Resp 20 | Ht 62.0 in | Wt 129.4 lb

## 2014-06-08 DIAGNOSIS — C541 Malignant neoplasm of endometrium: Secondary | ICD-10-CM

## 2014-06-08 DIAGNOSIS — Z01411 Encounter for gynecological examination (general) (routine) with abnormal findings: Secondary | ICD-10-CM | POA: Diagnosis not present

## 2014-06-08 NOTE — Progress Notes (Signed)
Elka Younge here for follow up.  She reports numbness in both of her feet.  She is taking vitamin B6 per Dr. Marko Plume.  She denies having any bladder issues, vaginal discharge or bleeding.  She reports having diarrhea about every 2 weeks and takes Imodium as needed.  She denies nausea.  Her appetite is improving and she has more energy.  Her weight is up 9 lbs from 2/16.  She noticed swelling on the top of her left foot yesterday.  She does have a small amount of edema on the top of her left foot today.  She said that is the first time she has noticed any swelling.  BP 138/78 mmHg  Pulse 77  Temp(Src) 98.2 F (36.8 C) (Oral)  Resp 20  Ht 5\' 2"  (1.575 m)  Wt 129 lb 6.4 oz (58.695 kg)  BMI 23.66 kg/m2

## 2014-06-08 NOTE — Progress Notes (Addendum)
Radiation Oncology         [phone]) [phone] ________________________________  Name: [name] MRN: [id number]  Date: 06/08/2014  DOB: [birth year]  Follow-Up Visit Note  CC: [name], MD  Gordy Levan, MD  No diagnosis found.  Diagnosis:   81 yo female Stage IIIC2 endometrial cancer  Interval Since Last Radiation:  10  months  Narrative:  Ebony Herring here for follow up. She reports numbness in both of her feet. She is taking vitamin B6 per Dr. Marko Plume. She denies having any bladder issues, vaginal discharge or bleeding. She reports having diarrhea about every 2 weeks and takes Imodium as needed. She denies nausea. Her appetite is improving and she has more energy. Her weight is up 9 lbs from 2/16. She noticed swelling on the top of her left foot yesterday. She does have a small amount of edema on the top of her left foot today. She said that is the first time she has noticed any swelling. No pain in pelvic region or back pain. No rectal bleeding. Appetite coming back. Using vaginal dilator. Occasional diarrhea (once every 3-4 weeks), no bleeding associated with diarrhea.                           ALLERGIES:  is allergic to bactrim and ciprofloxacin.  Meds: Current Outpatient Prescriptions  Medication Sig Dispense Refill  . Brinzolamide-Brimonidine (SIMBRINZA) 1-0.2 % SUSP Place 1 drop into both eyes 2 (two) times daily.     Marland Kitchen FERROCITE 324 MG TABS Take 1 tablet by mouth daily.  3  . pyridOXINE (VITAMIN B-6) 100 MG tablet Take 1 tablet (100 mg total) by mouth 2 (two) times daily. 100 tablet 3  . ferrous fumarate (HEMOCYTE - 106 MG FE) 325 (106 FE) MG TABS tablet Take 1 tablet (106 mg of iron total) by mouth daily. (Patient not taking: Reported on 06/08/2014) 30 each 3  . lidocaine-prilocaine (EMLA) cream Apply 1 application topically as needed. (Patient not taking: Reported on 06/08/2014) 30 g 0  . loperamide (IMODIUM) 2 MG capsule 2 mg as needed.     Marland Kitchen LORazepam (ATIVAN) 0.5 MG  tablet Take 1 tablet under the tongue or swallow every 6 hours as needed for nausea (Patient not taking: Reported on 03/17/2014) 20 tablet 0  . ondansetron (ZOFRAN) 8 MG tablet Take 1-2 tablets (8-16 mg total) by mouth every 12 (twelve) hours as needed for nausea or vomiting. (Patient not taking: Reported on 03/17/2014) 20 tablet 1   No current facility-administered medications for this encounter.    Physical Findings: The patient is in no acute distress. Patient is alert and oriented.  height is 5\' 2"  (1.575 m) and weight is 129 lb 6.4 oz (58.695 kg). Her oral temperature is 98.2 F (36.8 C). Her blood pressure is 138/78 and her pulse is 77. Her respiration is 20. Marland Kitchen   No palpable superclavicular or clavicular adenopathy. No axillary or inguinal adenopathy. Abdomen soft nontender, normal bowl sounds. Normal heart rhythm and rate.  On pelvic examination the external genitalia is unremarkable. A speculum exam is performed. There are no mucosal lesions noted in the vaginal vault. A Pap smear was obtained of the proximal vagina. On bimanual and rectovaginal examination there no pelvic masses appreciated  Lab Findings: Lab Results  Component Value Date   WBC 5.5 05/19/2014   HGB 9.8* 05/19/2014   HCT 30.5* 05/19/2014   MCV 96.6 05/19/2014   PLT 290  05/19/2014    Radiographic Findings: Ct Chest W Contrast  Impression:  No evidence of recurrence on clinical exam. Pap smear pending.  Plan:    Follow up in 6 months. In interim, will be seen by gynecological oncology.  This document serves as a record of services personally performed by Gery Pray, MD. It was created on his behalf by Arlyce Harman, a trained medical scribe. The creation of this record is based on the scribe's personal observations and the provider's statements to them. This document has been checked and approved by the attending provider.  ____________________________________ Gery Pray, md

## 2014-06-13 LAB — CYTOLOGY - PAP

## 2014-06-15 ENCOUNTER — Ambulatory Visit: Payer: Medicare Other | Admitting: Radiation Oncology

## 2014-06-19 ENCOUNTER — Other Ambulatory Visit: Payer: Self-pay

## 2014-06-19 ENCOUNTER — Telehealth: Payer: Self-pay | Admitting: Oncology

## 2014-06-19 DIAGNOSIS — T451X5A Adverse effect of antineoplastic and immunosuppressive drugs, initial encounter: Principal | ICD-10-CM

## 2014-06-19 DIAGNOSIS — D6481 Anemia due to antineoplastic chemotherapy: Secondary | ICD-10-CM

## 2014-06-19 MED ORDER — FERROCITE 324 MG PO TABS
1.0000 | ORAL_TABLET | Freq: Every day | ORAL | Status: DC
Start: 2014-06-19 — End: 2014-08-16

## 2014-06-19 NOTE — Telephone Encounter (Signed)
Called Ebony Herring and advised her that her pap smear is normal.  Valaria verbalized agreement.

## 2014-07-04 ENCOUNTER — Other Ambulatory Visit: Payer: Self-pay | Admitting: Oncology

## 2014-07-06 ENCOUNTER — Ambulatory Visit (HOSPITAL_BASED_OUTPATIENT_CLINIC_OR_DEPARTMENT_OTHER): Payer: Medicare Other | Admitting: Oncology

## 2014-07-06 ENCOUNTER — Telehealth: Payer: Self-pay | Admitting: Oncology

## 2014-07-06 ENCOUNTER — Ambulatory Visit: Payer: Medicare Other | Admitting: Radiation Oncology

## 2014-07-06 ENCOUNTER — Encounter: Payer: Self-pay | Admitting: Oncology

## 2014-07-06 ENCOUNTER — Ambulatory Visit (HOSPITAL_COMMUNITY)
Admission: RE | Admit: 2014-07-06 | Discharge: 2014-07-06 | Disposition: A | Discharge status: Home / Self Care | Payer: Medicare Other | Source: Ambulatory Visit | Attending: Oncology | Admitting: Oncology

## 2014-07-06 ENCOUNTER — Ambulatory Visit: Payer: Medicare Other

## 2014-07-06 ENCOUNTER — Other Ambulatory Visit (HOSPITAL_BASED_OUTPATIENT_CLINIC_OR_DEPARTMENT_OTHER): Payer: Medicare Other

## 2014-07-06 ENCOUNTER — Telehealth: Payer: Self-pay

## 2014-07-06 VITALS — BP 145/72 | HR 69 | Temp 98.5°F | Resp 18 | Ht 62.0 in | Wt 131.7 lb

## 2014-07-06 DIAGNOSIS — C541 Malignant neoplasm of endometrium: Secondary | ICD-10-CM

## 2014-07-06 DIAGNOSIS — M7989 Other specified soft tissue disorders: Secondary | ICD-10-CM

## 2014-07-06 DIAGNOSIS — D6481 Anemia due to antineoplastic chemotherapy: Secondary | ICD-10-CM

## 2014-07-06 DIAGNOSIS — G629 Polyneuropathy, unspecified: Secondary | ICD-10-CM

## 2014-07-06 DIAGNOSIS — G622 Polyneuropathy due to other toxic agents: Secondary | ICD-10-CM | POA: Diagnosis not present

## 2014-07-06 DIAGNOSIS — Z95828 Presence of other vascular implants and grafts: Secondary | ICD-10-CM

## 2014-07-06 DIAGNOSIS — T451X5A Adverse effect of antineoplastic and immunosuppressive drugs, initial encounter: Secondary | ICD-10-CM

## 2014-07-06 DIAGNOSIS — G620 Drug-induced polyneuropathy: Secondary | ICD-10-CM

## 2014-07-06 LAB — COMPREHENSIVE METABOLIC PANEL (CC13)
ALK PHOS: 62 U/L (ref 40–150)
ALT: 9 U/L (ref 0–55)
ANION GAP: 10 meq/L (ref 3–11)
AST: 18 U/L (ref 5–34)
Albumin: 3.7 g/dL (ref 3.5–5.0)
BILIRUBIN TOTAL: 0.31 mg/dL (ref 0.20–1.20)
BUN: 15.2 mg/dL (ref 7.0–26.0)
CHLORIDE: 108 meq/L (ref 98–109)
CO2: 22 meq/L (ref 22–29)
Calcium: 8.3 mg/dL — ABNORMAL LOW (ref 8.4–10.4)
Creatinine: 1 mg/dL (ref 0.6–1.1)
EGFR: 52 mL/min/{1.73_m2} — ABNORMAL LOW (ref >90)
GLUCOSE: 91 mg/dL (ref 70–140)
Potassium: 4.5 mEq/L (ref 3.5–5.1)
SODIUM: 141 meq/L (ref 136–145)
TOTAL PROTEIN: 6.6 g/dL (ref 6.4–8.3)

## 2014-07-06 LAB — CBC WITH DIFFERENTIAL/PLATELET
BASO%: 0.6 % (ref 0.0–2.0)
Basophils Absolute: 0 10*3/uL (ref 0.0–0.1)
EOS ABS: 0.1 10*3/uL (ref 0.0–0.5)
EOS%: 2.2 % (ref 0.0–7.0)
HEMATOCRIT: 32.2 % — AB (ref 34.8–46.6)
HGB: 10.6 g/dL — ABNORMAL LOW (ref 11.6–15.9)
LYMPH#: 1.4 10*3/uL (ref 0.9–3.3)
LYMPH%: 24.1 % (ref 14.0–49.7)
MCH: 31.5 pg (ref 25.1–34.0)
MCHC: 33 g/dL (ref 31.5–36.0)
MCV: 95.4 fL (ref 79.5–101.0)
MONO#: 0.5 10*3/uL (ref 0.1–0.9)
MONO%: 8.5 % (ref 0.0–14.0)
NEUT%: 64.6 % (ref 38.4–76.8)
NEUTROS ABS: 3.9 10*3/uL (ref 1.5–6.5)
Platelets: 277 10*3/uL (ref 145–400)
RBC: 3.38 10*6/uL — AB (ref 3.70–5.45)
RDW: 13.4 % (ref 11.2–14.5)
WBC: 6 10*3/uL (ref 3.9–10.3)

## 2014-07-06 MED ORDER — HEPARIN SOD (PORK) LOCK FLUSH 100 UNIT/ML IV SOLN
500.0000 [IU] | Freq: Once | INTRAVENOUS | Status: AC
  Administered 2014-07-06: 500 [IU] via INTRAVENOUS
  Filled 2014-07-06: qty 5

## 2014-07-06 MED ORDER — SODIUM CHLORIDE 0.9 % IJ SOLN
10.0000 mL | INTRAMUSCULAR | Status: DC | PRN
  Administered 2014-07-06: 10 mL via INTRAVENOUS
  Filled 2014-07-06: qty 10

## 2014-07-06 MED ORDER — GABAPENTIN 100 MG PO CAPS: 100.0000 mg | ORAL_CAPSULE | Freq: Every day | ORAL | Status: DC

## 2014-07-06 NOTE — Telephone Encounter (Signed)
Spoke with Ebony Herring about beginning the gabapentin as noted below by Dr. Marko Plume. Requested that she call Dr. Mariana Kaufman office in ~ a month to let her know if the medication is effective or not.  If the gabapentin is effective she will need refills until appointment 08-31-14. [name] aware that the doppler study of LLE was negative for DVT.  Patient verbalized understanding.

## 2014-07-06 NOTE — Progress Notes (Signed)
VASCULAR LAB PRELIMINARY  PRELIMINARY  PRELIMINARY  PRELIMINARY  Left lower extremity venous duplex completed.    Preliminary report:  Left:  No evidence of DVT, superficial thrombosis, or Baker's cyst.  Ivan Maskell, RVT 07/06/2014, 2:16 PM

## 2014-07-06 NOTE — Progress Notes (Signed)
OFFICE PROGRESS NOTE   Jul 06, 2014   Physicians: Deedra Ehrich, [name] F (PCP [facility]), Gery Pray, Everlene Farrier, Donato Heinz (ophth)  INTERVAL HISTORY:   Patient is seen, alone for visit, in scheduled follow up of IIIC2 high grade serous endometrial carcinoma, on observation since completing adjuvant chemotherapy and radiation 10-03-13. Last imaging was CT CAP 11-2013. She is alternating visits with Drs Josephina Shih and Sondra Come, having seen Dr Josephina Shih in February and to see Dr Sondra Come next in October.   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 The neuropathy symptoms are worse when she stands for long, which interferes with activities such as house work and gardening. She had tick on left ankle ~ 2 weeks ago and since then has had swelling in left foot, no pain, no rash or fever, no swelling in leg. She continues oral iron, denies symptoms of anemia, has had no overt bleeding. Appetite is good, bowels moving regularly without laxatives, no abdominal or pelvic discomfort.   [name] in, flushed today  ONCOLOGIC HISTORY Patient presented to PCP with vaginal discharge with some bleeding since Nov 2014. 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 MRI pelvis 01-20-13 Had uterine mass and prominent bilateral iliac nodes. She had TAH BSO and pelvic and para-aortic lymphadenectomy by Dr Josephina Shih on 03-01-2013. Pathology [phone]) had high grade serous carcinoma of endometrium extending to outer half of myometrium, with extensive angiolymphatic invasion, involvement of right ovary, involvement of 2/9 pelvic nodes and 3/4 para-aortic nodes. At completion of surgery there was no gross residual disease. She had 3 cycles of taxol carboplatin from 03-28-13 thru 05-09-2013, with neupogen support. She had pelvic RT 45 Gy thru ~  07-05-13 and HDR x4 thru 07-27-13. Chemo resumed with taxol carboplatin cycle 4 on 08-22-13, with neupogen support, for total 6 cycles thru 10-03-13. CT CAP 11-14-13 showed no recurrent or metastatic gyn cancer.    Review of systems as above, also: No problems with [name]. No respiratory symptoms. No recent infectious illness. Remainder of 10 point Review of Systems negative.  Objective:  Vital signs in last 24 hours:  BP 145/72 mmHg  Pulse 69  Temp(Src) 98.5 F (36.9 C) (Oral)  Resp 18  Ht $R'5\' 2"'Bw$  (1.575 m)  Wt 131 lb 11.2 oz (59.739 kg)  BMI 24.08 kg/m2  SpO2 100% Weight up 3 lbs Alert, oriented and appropriate. Ambulatory without difficulty.  No alopecia  HEENT:PERRL, sclerae not icteric. Oral mucosa moist without lesions, posterior pharynx clear.  Neck supple. No JVD.  Lymphatics:no cervical,supraclavicular, axillary or inguinal adenopathy Resp: clear to auscultation bilaterally and normal percussion bilaterally Cardio: regular rate and rhythm. No gallop. GI: soft, nontender, not distended, no mass or organomegaly. Normally active bowel sounds. Surgical incision not remarkable. Musculoskeletal/ Extremities: Left foot swollen, no tenderness or rash, area of the tick bite still obvious on anterior ankle but no erythema there. Extremities otherwise without pitting edema, cords, tenderness Neuro: peripheral neuropathy distal feet as noted. Otherwise nonfocal. PSYCH appropriate mood and affect Skin without rash, ecchymosis, petechiae Portacath-without erythema or tenderness  Lab Results:  Results for orders placed or performed in visit on 07/06/14  CBC with Differential  Result Value Ref Range   WBC 6.0 3.9 - 10.3 10e3/uL   NEUT# 3.9 1.5 - 6.5 10e3/uL   HGB 10.6 (L) 11.6 - 15.9 g/dL   HCT 32.2 (L) 34.8 - 46.6 %  Platelets 277 145 - 400 10e3/uL   MCV 95.4 79.5 - 101.0 fL   MCH 31.5 25.1 - 34.0 pg   MCHC 33.0 31.5 - 36.0 g/dL   RBC 3.38 (L) 3.70 - 5.45 10e6/uL   RDW 13.4 11.2 -  14.5 %   lymph# 1.4 0.9 - 3.3 10e3/uL   MONO# 0.5 0.1 - 0.9 10e3/uL   Eosinophils Absolute 0.1 0.0 - 0.5 10e3/uL   Basophils Absolute 0.0 0.0 - 0.1 10e3/uL   NEUT% 64.6 38.4 - 76.8 %   LYMPH% 24.1 14.0 - 49.7 %   MONO% 8.5 0.0 - 14.0 %   EOS% 2.2 0.0 - 7.0 %   BASO% 0.6 0.0 - 2.0 %  Comprehensive metabolic panel (Cmet) - [facility]  Result Value Ref Range   Sodium 141 136 - 145 mEq/L   Potassium 4.5 3.5 - 5.1 mEq/L   Chloride 108 98 - 109 mEq/L   CO2 22 22 - 29 mEq/L   Glucose 91 70 - 140 mg/dl   BUN 15.2 7.0 - 26.0 mg/dL   Creatinine 1.0 0.6 - 1.1 mg/dL   Total Bilirubin 0.31 0.20 - 1.20 mg/dL   Alkaline Phosphatase 62 40 - 150 U/L   AST 18 5 - 34 U/L   ALT 9 0 - 55 U/L   Total Protein 6.6 6.4 - 8.3 g/dL   Albumin 3.7 3.5 - 5.0 g/dL   Calcium 8.3 (L) 8.4 - 10.4 mg/dL   Anion Gap 10 3 - 11 mEq/L   EGFR 52 (L) >90 ml/min/1.73 m2     Studies/Results:  LLE venous doppler obtained after visit today, negative for DVT per phoned report to this MD  Medications: I have reviewed the patient's current medications. Begin neurontin 100 mg at hs for neuropathy  DISCUSSION: discussed trying gabapentin for neuropathy symptoms, beginning with low dose at hs as this may make her drowsy, but can increase if tolerates and if needed. She is not alone at home at night. Told patient that, if doppler did not show DVT, she should elevate the left foot as much as possible this weekend. If swelling still significant next week, she should let PCP or other MD know.  Assessment/Plan:  1.IIIC2 high grade serous endometrial carcinoma: surgery 03-01-13, 3 cycles of taxol carboplatin 03-28-13 thru 05-09-13 with gCSF required, pelvic RT + HDR by Dr Sondra Come completed 07-27-13, and 3 additional cycles of taxol and carboplatin completed 10-03-13. Clinically doing well. Will try to alternate visits with other MDs. 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 Try gabapentin at hs. 3.[name] in. Very  high risk disease, patient agrees for now to keep [name] in. Flush here q 6-8 weeks. 4. Swelling left foot possibly related to recent tick bite, no DVT. Plan as above. Did not begin doxycycline, tho could consider that if symptoms do not resolve. 5.anemia: related to chemo and RT. A little better, continue oral iron.  Other counts good. 6.no mammograms in years, no colonoscopy. She does not like medical interventions. 7.glaucoma   Patient seemed to understand conversation and is in agreement with plans. Time spent 25 min including >50% counseling and coordination of care.    Kellyann Ordway P, MD   07/06/2014, 3:53 PM

## 2014-07-06 NOTE — Telephone Encounter (Signed)
-----   Message from Gordy Levan, MD sent at 07/06/2014  1:18 PM EDT ----- Gabapentin 100 mg at bedtime for neuropathy in feet. # 30   We need to let her know about venous dopplers when done.    If dopplers negative for DVT, she should elevate the foot over next few days. If foot stays swollen, she should call PCP  When you speak with her about the foot, tell her that we have sent this prescription for neuropathy as she and I discussed. Remind her that the gabapentin might make her sleepy at least at first, tho the drowsiness usually lessens after being on the medicine for several weeks. If she dislikes the drowsiness or if the gabapentin does not help neuropathy symptoms at night, she can stop it. If it does help, fine to continue until next apt.

## 2014-07-06 NOTE — Patient Instructions (Signed)

## 2014-07-06 NOTE — Telephone Encounter (Signed)
Gave avs & calendar for July, September, & November.

## 2014-08-16 ENCOUNTER — Encounter: Payer: Self-pay | Admitting: Family Medicine

## 2014-08-16 ENCOUNTER — Ambulatory Visit (INDEPENDENT_AMBULATORY_CARE_PROVIDER_SITE_OTHER): Payer: Medicare Other | Admitting: Family Medicine

## 2014-08-16 VITALS — BP 128/70 | HR 68 | Temp 98.8°F | Resp 16 | Ht 62.0 in | Wt 130.0 lb

## 2014-08-16 DIAGNOSIS — L03119 Cellulitis of unspecified part of limb: Secondary | ICD-10-CM

## 2014-08-16 MED ORDER — DOXYCYCLINE HYCLATE 100 MG PO TABS: 100.0000 mg | ORAL_TABLET | Freq: Two times a day (BID) | ORAL | Status: DC

## 2014-08-16 NOTE — Patient Instructions (Addendum)
Take antibiotics by mouth Clean and use triple antibiotic ointment  Elevate your foot Come back in 1 week if not better F/U 4 months

## 2014-08-16 NOTE — Progress Notes (Signed)
Patient ID: Ebony Herring, female   DOB: 1932-01-20, 81 y.o.   MRN: [id number]   Subjective:    Patient ID: Ebony Herring, female    DOB: 03-22-1931, 81 y.o.   MRN: [id number]  Patient presents for Weeping Edema  patient here with swelling of her left foot. This actually started after she was bitten by a tick but this was approximately 6 weeks ago. She did see her oncologist who discussed doxycycline with her but she never took the antibody. Over the past week she noticed a blister that came up between her toes that has been draining a yellow fluid and there is some redness surrounding it. She states that she was up on her feet a lot this weekend doing a lot of cooking before the blisters came up She does have neuropathy in both feet this is secondary to the Taxol that she was on for her chemotherapy. She's been tried on vitamin V6 as well as gabapentin with no improvement in her symptoms.  Neg DVT- US done in May    Review Of Systems:  GEN- denies fatigue, fever, weight loss,weakness, recent illness HEENT- denies eye drainage, change in vision, nasal discharge, CVS- denies chest pain, palpitations RESP- denies SOB, cough, wheeze ABD- denies N/V, change in stools, abd pain GU- denies dysuria, hematuria, dribbling, incontinence MSK- + joint pain, muscle aches, injury Neuro- denies headache, dizziness, syncope, seizure activity       Objective:    BP 128/70 mmHg  Pulse 68  Temp(Src) 98.8 F (37.1 C) (Oral)  Resp 16  Ht 5\' 2"  (1.575 m)  Wt 130 lb (58.968 kg)  BMI 23.77 kg/m2 GEN- NAD, alert and oriented x3 HEENT- PERRL, EOMI, non injected sclera, pink conjunctiva, MMM, oropharynx clear Neck- Supple, no JVD CVS- RRR, no murmur RESP-CTAB EXT- RLE No edema, left foot- swelling from toes to mid foot,mild TTP, open blisters between 3rd and 4th goes with erythema and drainage , TTP, maceration between toes  Pulses- Radial, DP- 2+        Assessment & Plan:      Problem List Items  Addressed This Visit    None    Visit Diagnoses    Cellulitis of foot    -  Primary    concern for cellulitis, the blister may have come from some mild trauma or pressure from the persistant swelling, however symptoms did not start until after tick bite, but she was bitten on lower leg and this area is clear. Will treat with oral antibiotics , elevate foot, dressing applied to open wound        Note: This dictation was prepared with Dragon dictation along with smaller phrase technology. Any transcriptional errors that result from this process are unintentional.

## 2014-08-30 ENCOUNTER — Other Ambulatory Visit: Payer: Self-pay | Admitting: *Deleted

## 2014-08-30 DIAGNOSIS — C541 Malignant neoplasm of endometrium: Secondary | ICD-10-CM

## 2014-08-31 ENCOUNTER — Ambulatory Visit: Payer: Medicare Other

## 2014-08-31 ENCOUNTER — Other Ambulatory Visit (HOSPITAL_BASED_OUTPATIENT_CLINIC_OR_DEPARTMENT_OTHER): Payer: Medicare Other

## 2014-08-31 VITALS — BP 145/67 | HR 76 | Temp 98.7°F

## 2014-08-31 DIAGNOSIS — C541 Malignant neoplasm of endometrium: Secondary | ICD-10-CM | POA: Diagnosis not present

## 2014-08-31 DIAGNOSIS — Z95828 Presence of other vascular implants and grafts: Secondary | ICD-10-CM

## 2014-08-31 LAB — CBC WITH DIFFERENTIAL/PLATELET
BASO%: 1.1 % (ref 0.0–2.0)
BASOS ABS: 0.1 10*3/uL (ref 0.0–0.1)
EOS%: 4 % (ref 0.0–7.0)
Eosinophils Absolute: 0.3 10*3/uL (ref 0.0–0.5)
HCT: 34.7 % — ABNORMAL LOW (ref 34.8–46.6)
HGB: 11.4 g/dL — ABNORMAL LOW (ref 11.6–15.9)
LYMPH%: 20.1 % (ref 14.0–49.7)
MCH: 30.8 pg (ref 25.1–34.0)
MCHC: 32.7 g/dL (ref 31.5–36.0)
MCV: 94.3 fL (ref 79.5–101.0)
MONO#: 0.5 10*3/uL (ref 0.1–0.9)
MONO%: 7.8 % (ref 0.0–14.0)
NEUT#: 4.7 10*3/uL (ref 1.5–6.5)
NEUT%: 67 % (ref 38.4–76.8)
Platelets: 270 10*3/uL (ref 145–400)
RBC: 3.68 10*6/uL — ABNORMAL LOW (ref 3.70–5.45)
RDW: 13.6 % (ref 11.2–14.5)
WBC: 6.9 10*3/uL (ref 3.9–10.3)
lymph#: 1.4 10*3/uL (ref 0.9–3.3)

## 2014-08-31 LAB — COMPREHENSIVE METABOLIC PANEL (CC13)
ALT: 8 U/L (ref 0–55)
AST: 15 U/L (ref 5–34)
Albumin: 4.1 g/dL (ref 3.5–5.0)
Alkaline Phosphatase: 66 U/L (ref 40–150)
Anion Gap: 8 mEq/L (ref 3–11)
BUN: 17.4 mg/dL (ref 7.0–26.0)
CALCIUM: 9.4 mg/dL (ref 8.4–10.4)
CO2: 26 meq/L (ref 22–29)
CREATININE: 1.1 mg/dL (ref 0.6–1.1)
Chloride: 109 mEq/L (ref 98–109)
EGFR: 49 mL/min/{1.73_m2} — ABNORMAL LOW (ref >90)
Glucose: 92 mg/dl (ref 70–140)
POTASSIUM: 4.5 meq/L (ref 3.5–5.1)
SODIUM: 143 meq/L (ref 136–145)
Total Bilirubin: 0.4 mg/dL (ref 0.20–1.20)
Total Protein: 7 g/dL (ref 6.4–8.3)

## 2014-08-31 MED ORDER — HEPARIN SOD (PORK) LOCK FLUSH 100 UNIT/ML IV SOLN
500.0000 [IU] | Freq: Once | INTRAVENOUS | Status: AC
  Administered 2014-08-31: 500 [IU] via INTRAVENOUS
  Filled 2014-08-31: qty 5

## 2014-08-31 MED ORDER — SODIUM CHLORIDE 0.9 % IJ SOLN
10.0000 mL | INTRAMUSCULAR | Status: DC | PRN
  Administered 2014-08-31: 10 mL via INTRAVENOUS
  Filled 2014-08-31: qty 10

## 2014-08-31 NOTE — Patient Instructions (Signed)

## 2014-08-31 NOTE — Progress Notes (Signed)
Unable to obtain labs from [facility].  [name] flushed and some blood obtained.  Not enough for labs.  Pt sent to phlebotomist to have labs drawn.

## 2014-10-19 ENCOUNTER — Other Ambulatory Visit: Payer: Self-pay

## 2014-10-19 DIAGNOSIS — C541 Malignant neoplasm of endometrium: Secondary | ICD-10-CM

## 2014-10-19 DIAGNOSIS — D509 Iron deficiency anemia, unspecified: Secondary | ICD-10-CM

## 2014-10-19 MED ORDER — FERROUS FUMARATE 325 (106 FE) MG PO TABS: 1.0000 | ORAL_TABLET | Freq: Every day | ORAL | Status: DC

## 2014-10-25 ENCOUNTER — Other Ambulatory Visit: Payer: Self-pay

## 2014-10-25 DIAGNOSIS — C541 Malignant neoplasm of endometrium: Secondary | ICD-10-CM

## 2014-10-26 ENCOUNTER — Ambulatory Visit: Payer: Medicare Other

## 2014-10-26 ENCOUNTER — Other Ambulatory Visit (HOSPITAL_BASED_OUTPATIENT_CLINIC_OR_DEPARTMENT_OTHER): Payer: Medicare Other

## 2014-10-26 ENCOUNTER — Other Ambulatory Visit: Payer: Self-pay

## 2014-10-26 DIAGNOSIS — Z95828 Presence of other vascular implants and grafts: Secondary | ICD-10-CM

## 2014-10-26 DIAGNOSIS — C541 Malignant neoplasm of endometrium: Secondary | ICD-10-CM

## 2014-10-26 DIAGNOSIS — Z452 Encounter for adjustment and management of vascular access device: Secondary | ICD-10-CM

## 2014-10-26 LAB — CBC WITH DIFFERENTIAL/PLATELET
BASO%: 0.6 % (ref 0.0–2.0)
BASOS ABS: 0 10*3/uL (ref 0.0–0.1)
EOS%: 3.1 % (ref 0.0–7.0)
Eosinophils Absolute: 0.2 10*3/uL (ref 0.0–0.5)
HEMATOCRIT: 34.7 % — AB (ref 34.8–46.6)
HEMOGLOBIN: 11.1 g/dL — AB (ref 11.6–15.9)
LYMPH#: 1.5 10*3/uL (ref 0.9–3.3)
LYMPH%: 23 % (ref 14.0–49.7)
MCH: 30.5 pg (ref 25.1–34.0)
MCHC: 32 g/dL (ref 31.5–36.0)
MCV: 95.3 fL (ref 79.5–101.0)
MONO#: 0.5 10*3/uL (ref 0.1–0.9)
MONO%: 7.5 % (ref 0.0–14.0)
NEUT#: 4.2 10*3/uL (ref 1.5–6.5)
NEUT%: 65.8 % (ref 38.4–76.8)
Platelets: 311 10*3/uL (ref 145–400)
RBC: 3.64 10*6/uL — ABNORMAL LOW (ref 3.70–5.45)
RDW: 13.2 % (ref 11.2–14.5)
WBC: 6.4 10*3/uL (ref 3.9–10.3)

## 2014-10-26 LAB — COMPREHENSIVE METABOLIC PANEL (CC13)
ALK PHOS: 79 U/L (ref 40–150)
ALT: <6 U/L (ref 0–55)
ANION GAP: 8 meq/L (ref 3–11)
AST: 14 U/L (ref 5–34)
Albumin: 3.9 g/dL (ref 3.5–5.0)
BILIRUBIN TOTAL: 0.26 mg/dL (ref 0.20–1.20)
BUN: 14.8 mg/dL (ref 7.0–26.0)
CALCIUM: 9.3 mg/dL (ref 8.4–10.4)
CHLORIDE: 107 meq/L (ref 98–109)
CO2: 27 mEq/L (ref 22–29)
CREATININE: 1.1 mg/dL (ref 0.6–1.1)
EGFR: 48 mL/min/{1.73_m2} — ABNORMAL LOW (ref >90)
Glucose: 92 mg/dl (ref 70–140)
Potassium: 4.7 mEq/L (ref 3.5–5.1)
Sodium: 142 mEq/L (ref 136–145)
TOTAL PROTEIN: 7.1 g/dL (ref 6.4–8.3)

## 2014-10-26 MED ORDER — SODIUM CHLORIDE 0.9 % IJ SOLN
10.0000 mL | INTRAMUSCULAR | Status: DC | PRN
  Administered 2014-10-26: 10 mL via INTRAVENOUS
  Filled 2014-10-26: qty 10

## 2014-10-26 MED ORDER — HEPARIN SOD (PORK) LOCK FLUSH 100 UNIT/ML IV SOLN
500.0000 [IU] | Freq: Once | INTRAVENOUS | Status: DC
  Filled 2014-10-26: qty 5

## 2014-10-26 MED ORDER — ALTEPLASE 2 MG IJ SOLR
2.0000 mg | Freq: Once | INTRAMUSCULAR | Status: DC | PRN
  Filled 2014-10-26: qty 2

## 2014-10-26 MED ORDER — SODIUM CHLORIDE 0.9 % IJ SOLN
10.0000 mL | INTRAMUSCULAR | Status: DC | PRN
  Filled 2014-10-26: qty 10

## 2014-10-26 MED ORDER — HEPARIN SOD (PORK) LOCK FLUSH 100 UNIT/ML IV SOLN
500.0000 [IU] | Freq: Once | INTRAVENOUS | Status: AC
Start: 2014-10-26 — End: 2014-10-26
  Administered 2014-10-26: 500 [IU] via INTRAVENOUS
  Filled 2014-10-26: qty 5

## 2014-10-26 NOTE — Progress Notes (Signed)
Sent POF for patient to have Cath flow instilled into pac with no blood return today per Dr. Marko Plume.

## 2014-10-26 NOTE — Progress Notes (Signed)
Pt reported to flush for lab drawn and flush appt. Unable to get blood return from port. When attempted only pink tinged clear fluid returned. Pt states this has happened a few times most recently. Contacted colab nurse and left message. Pt was de-accessed. Pt would like to have CathFlo inserted to return port a cath to functioning condition. If necessary she would like to have a dye study completed. She has had difficulty with her veins in the past. Pt went to lab for a peripheral lab draw.

## 2014-10-26 NOTE — Patient Instructions (Signed)

## 2014-10-27 ENCOUNTER — Telehealth: Payer: Self-pay | Admitting: Oncology

## 2014-10-27 ENCOUNTER — Telehealth: Payer: Self-pay

## 2014-10-27 ENCOUNTER — Telehealth: Payer: Self-pay | Admitting: *Deleted

## 2014-10-27 DIAGNOSIS — G620 Drug-induced polyneuropathy: Secondary | ICD-10-CM

## 2014-10-27 DIAGNOSIS — C541 Malignant neoplasm of endometrium: Secondary | ICD-10-CM

## 2014-10-27 DIAGNOSIS — T451X5A Adverse effect of antineoplastic and immunosuppressive drugs, initial encounter: Principal | ICD-10-CM

## 2014-10-27 MED ORDER — GABAPENTIN 300 MG PO CAPS: 300.0000 mg | ORAL_CAPSULE | Freq: Every day | ORAL | Status: DC

## 2014-10-27 NOTE — Telephone Encounter (Signed)
s.w. pt and advised on monday appt ok and aware

## 2014-10-27 NOTE — Telephone Encounter (Signed)
Told Ebony Herring the results of labs from 10-26-14 as noted below by Dr. Marko Plume. Ebony Herring stated that she continues with the burning numbness in her feet.  She had taken Neurontin 100 mg at HS x 30 days beginning 07-06-14.  The medication did not make her sleepy.  It did not help with the neuropathy.  She stopped it after the 30 days.  Told her that Dr. Marko Plume can increase the dose to see if that would help her symptoms.  She is willing to try anything. Told her that a message would be sent to Dr. Marko Plume about dose adjustment and this office would let her know what she recommends.

## 2014-10-27 NOTE — Telephone Encounter (Signed)
-----   Message from Gordy Levan, MD sent at 10/27/2014  7:27 AM EDT ----- Labs seen and need follow up: please let her know labs stable

## 2014-10-27 NOTE — Telephone Encounter (Signed)
Per staff message and POF I have scheduled appts. Advised scheduler of appts. JMW  

## 2014-10-28 IMAGING — US IR FLUORO GUIDE CV LINE*R*
1 series · 1 of 1 positions shown · non-contrast
Comparison: None.

INDICATION: HISTORY OF ENDOMETRIAL CANCER, IN NEED OF INTRAVENOUS ACCESS FOR
CHEMOTHERAPY ADMINISTRATION

EXAM:
IMPLANTED PORT A CATH PLACEMENT WITH ULTRASOUND AND FLUOROSCOPIC
GUIDANCE

[Series 1: sp fluoro guide cv line*left* · 1 of 1 slices shown]
[im 1/1]
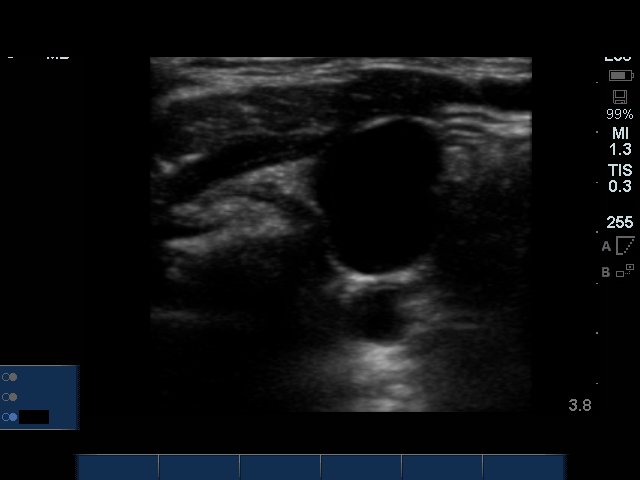

[1 of 1 positions shown; findings below may reference images not displayed]

MEDICATIONS:
Ancef 2 gm IV; The antibiotic was administered within an appropriate
time interval prior to skin puncture.

ANESTHESIA/SEDATION:
Versed 2 mg IV; Fentanyl 100 mcg IV;

Total Moderate Sedation Time

25  minutes.

CONTRAST:  None

FLUOROSCOPY TIME:  18 seconds.

COMPLICATIONS:
None immediate

PROCEDURE:
The procedure, risks, benefits, and alternatives were explained to
the patient. Questions regarding the procedure were encouraged and
answered. The patient understands and consents to the procedure.

The right neck and chest were prepped with chlorhexidine in a
sterile fashion, and a sterile drape was applied covering the
operative field. Maximum barrier sterile technique with sterile
gowns and gloves were used for the procedure. A timeout was
performed prior to the initiation of the procedure. Local anesthesia
was provided with 1% lidocaine with epinephrine.

After creating a small venotomy incision, a micropuncture kit was
utilized to access the internal jugular vein under direct, real-time
ultrasound guidance. Ultrasound image documentation was performed.
The microwire was kinked to measure appropriate catheter length.

A subcutaneous port pocket was then created along the upper chest
wall utilizing a combination of sharp and blunt dissection. The
pocket was irrigated with sterile saline. A single lumen thin power
injectable port was chosen for placement. The 8 Fr catheter was
tunneled from the port pocket site to the venotomy incision. The
port was placed in the pocket. The external catheter was trimmed to
appropriate length. At the venotomy, an 8 Fr peel-away sheath was
placed over a guidewire under fluoroscopic guidance. The catheter
was then placed through the sheath and the sheath was removed. Final
catheter positioning was confirmed and documented with a
fluoroscopic spot radiograph. The port was accessed with Gayla Baumgartner
needle, aspirated and flushed with heparinized saline.

The venotomy site was closed with an interrupted 4-0 Vicryl suture.
The port pocket incision was closed with interrupted 2-0 Vicryl
suture and the skin was opposed with a running subcuticular 4-0
Vicryl suture. Dermabond and Dinesen were applied to both
incisions. Dressings were placed. The patient tolerated the
procedure well without immediate post procedural complication.
FINDINGS: After catheter placement, the tip lies within the superior
cavoatrial junction. The catheter aspirates and flushes normally and
is ready for immediate use.
IMPRESSION: Successful placement of a right internal jugular approach power
injectable Port-A-Cath. The catheter is ready for immediate use.

## 2014-10-30 ENCOUNTER — Ambulatory Visit (HOSPITAL_BASED_OUTPATIENT_CLINIC_OR_DEPARTMENT_OTHER): Payer: Medicare Other

## 2014-10-30 VITALS — BP 162/77 | HR 81 | Temp 98.2°F | Resp 18

## 2014-10-30 DIAGNOSIS — C541 Malignant neoplasm of endometrium: Secondary | ICD-10-CM

## 2014-10-30 DIAGNOSIS — Z452 Encounter for adjustment and management of vascular access device: Secondary | ICD-10-CM

## 2014-10-30 MED ORDER — ALTEPLASE 2 MG IJ SOLR
2.0000 mg | Freq: Once | INTRAMUSCULAR | Status: AC | PRN
  Administered 2014-10-30: 2 mg
  Filled 2014-10-30: qty 2

## 2014-10-30 MED ORDER — SODIUM CHLORIDE 0.9 % IJ SOLN
10.0000 mL | INTRAMUSCULAR | Status: DC | PRN
  Administered 2014-10-30: 10 mL via INTRAVENOUS
  Filled 2014-10-30: qty 10

## 2014-10-30 MED ORDER — HEPARIN SOD (PORK) LOCK FLUSH 100 UNIT/ML IV SOLN
500.0000 [IU] | Freq: Once | INTRAVENOUS | Status: AC
  Administered 2014-10-30: 500 [IU] via INTRAVENOUS
  Filled 2014-10-30: qty 5

## 2014-10-30 NOTE — Patient Instructions (Signed)
Alteplase, TPA injection What is this medicine? ALTEPLASE (AL te plase) can dissolve blood clots that form in the heart, blood vessels, or lungs after a heart attack. This medicine is also given to improve recovery and decrease the chance of disability in patients having symptoms of a stroke. This medicine may be used for other purposes; ask your health care provider or pharmacist if you have questions. COMMON BRAND NAME(S): Activase, Cathflo Activase What should I tell my health care provider before I take this medicine? They need to know if you have any of these conditions: -aneurysm -bleeding problems or problems with blood clotting -blood vessel disease or damaged blood vessels -diabetic retinopathy -head injury or tumor -high blood pressure -infection -irregular heartbeats -previous stroke -recent biopsy or surgery -an unusual or allergic reaction to alteplase, other medicines, foods, dyes, or preservatives -pregnant or trying to get pregnant -breast-feeding How should I use this medicine? This medicine is for injection into a vein. It is given by a health care professional in a hospital or clinic setting. Talk to your pediatrician regarding the use of this medicine in children. Special care may be needed. Overdosage: If you think you have taken too much of this medicine contact a poison control center or emergency room at once. NOTE: This medicine is only for you. Do not share this medicine with others. What if I miss a dose? This does not apply. What may interact with this medicine? Do not take this medicine with any of the following medications: -aminocaproic acid -aprotinin -tranexamic acid This medicine may also interact with the following medications: -antiinflammatory drugs, NSAIDs like ibuprofen -aspirin and aspirin-like medicines -blood thinners, like warfarin, heparin or enoxaparin -dipyridamole This list may not describe all possible interactions. Give your health  care provider a list of all the medicines, herbs, non-prescription drugs, or dietary supplements you use. Also tell them if you smoke, drink alcohol, or use illegal drugs. Some items may interact with your medicine. What should I watch for while using this medicine? Your condition will be monitored carefully while you are receiving this medicine. Follow the advice of your doctor or health care professional exactly. You may need bed rest to minimize the risk of bleeding. This medicine can make you bleed more easily. This effect can last for several days. Take special care brushing or flossing your teeth. Do not take aspirin, ibuprofen, or other nonprescription pain relievers during or for several days after alteplase treatment unless otherwise instructed by your doctor or health care professional. You may feel dizzy or lightheaded. To avoid the risk of dizzy or fainting spells, sit or stand up slowly, especially if you are an older patient. What side effects may I notice from receiving this medicine? Side effects that you should report to your doctor or health care professional as soon as possible: -allergic reactions like skin rash, itching or hives, swelling of the face, lips, or tongue -signs and symptoms of bleeding such as bloody or black, tarry stools; red or dark-brown urine; spitting up blood or brown material that looks like coffee grounds; red spots on the skin; unusual bruising or bleeding from the eye, gums, or nose -signs and symptoms of a stroke such as changes in vision; confusion; trouble speaking or understanding; severe headaches; sudden numbness or weakness of the face, arm, or leg; trouble walking; dizziness; loss of balance or coordination -slow or fast heart rate Side effects that usually do not require medical attention (report to your doctor or health care professional   if they continue or are bothersome): -dizziness -fever -nausea, vomiting This list may not describe all  possible side effects. Call your doctor for medical advice about side effects. You may report side effects to FDA at 1-800-FDA-1088. Where should I keep my medicine? This does not apply. You will not be given this medicine to store at home. NOTE: This sheet is a summary. It may not cover all possible information. If you have questions about this medicine, talk to your doctor, pharmacist, or health care provider.  2015, Elsevier/Gold Standard. (2012-05-25 19:16:18)  

## 2014-10-30 NOTE — Telephone Encounter (Signed)
Spoke with Ebony Herring and told her about Neurontin increased dosing to 300 mg at hs.  Told her to try Bio-Freeze spray  on the soles of her feet.  This was very helpful to a patient.  The patient's Podiatrist recommended it to her.  It can be purchased at [employer].  Pt anxious to see if medication and spry help her situation. Will follow up with herin 2 weeks to see how she is doing with new interventions.

## 2014-10-30 NOTE — Progress Notes (Signed)
1600 excellent blood return from port. Discarded 10 cc. Flushed per protocol.

## 2014-11-24 ENCOUNTER — Telehealth: Payer: Self-pay

## 2014-11-24 DIAGNOSIS — G620 Drug-induced polyneuropathy: Secondary | ICD-10-CM

## 2014-11-24 DIAGNOSIS — T451X5A Adverse effect of antineoplastic and immunosuppressive drugs, initial encounter: Principal | ICD-10-CM

## 2014-11-24 NOTE — Telephone Encounter (Signed)
Ebony Herring neurontin was increased from 100 mg to 300 mg q hs on 10-27-14 She states that the neuropathy in her feet is slightly improved on the 300 mg.  She is sleeping better with this dose of medication. She would not mind trying a higher dose of the neurontin if possible. She purchased Bio-Freeze spray a couple of weeks ago and sprays it on her feet in am and hs.  The has also helped with the discomfort in her feet from the neuropathy. Told her that this information would be given to Dr. Marko Plume to review and would let her know if any further dose  adjustments could be made with the Neurontin.

## 2014-11-28 NOTE — Telephone Encounter (Signed)
Re neurontin for peripheral neuropathy  Would not increase the hs dose above present 300 mg, but she could add 100 mg in AM and/ or 100 mg at midday if this does not make her too drowsy during day

## 2014-12-01 MED ORDER — GABAPENTIN 100 MG PO CAPS: ORAL_CAPSULE | ORAL | Status: DC

## 2014-12-01 NOTE — Addendum Note (Signed)
Addended by: Baruch Merl on: 12/01/2014 01:00 PM   Modules accepted: Orders

## 2014-12-01 NOTE — Telephone Encounter (Signed)
Discussed additional Neurontin dosing as noted below by Dr. Marko Plume. She stated that the neurontin does not make her too sleepy at night.  She said she will try an additional 100 mg  In the morning. Sent in prescription to her pharmacy.  Pt appreciative.

## 2014-12-07 ENCOUNTER — Encounter: Payer: Self-pay | Admitting: Radiation Oncology

## 2014-12-07 ENCOUNTER — Ambulatory Visit
Admission: RE | Admit: 2014-12-07 | Discharge: 2014-12-07 | Disposition: A | Discharge status: Home / Self Care | Payer: Medicare Other | Source: Ambulatory Visit | Attending: Radiation Oncology | Admitting: Radiation Oncology

## 2014-12-07 VITALS — BP 141/77 | HR 77 | Temp 98.7°F | Resp 18 | Ht 62.0 in | Wt 133.4 lb

## 2014-12-07 DIAGNOSIS — C541 Malignant neoplasm of endometrium: Secondary | ICD-10-CM | POA: Diagnosis not present

## 2014-12-07 NOTE — Progress Notes (Signed)
Radiation Oncology         [phone] ________________________________  Name: Ebony Herring MRN: [id number]  Date: 12/07/2014  DOB: 09-27-1931  Follow-Up Visit Note  CC: [name], MD  Alycia Rossetti, MD    ICD-9-CM ICD-10-CM   1. Endometrial cancer (HCC) 182.0 C54.1     Diagnosis: 81 yo female Stage IIIC2 endometrial cancer  Interval Since Last Radiation:  16  months   Narrative:  Ebony Herring presents for her follow-up appointment with radiation onoclogy. She denies symptoms of pain, difficulty breathing, blood in the urine/rectum, bladder or bowel issues, abdominal pain or fatigue.  She reports the neuropathy in her feet is improving on Gabapentin. The patient confirms that one night, about two weeks ago "I noticed a little pink" (vaginal bleeding) after using her vaginal dilator. "I didn't think that was too much to be concerned about." She reports using the dilator three times a week and that her appetite and energy level is improving. The patient is wearing a head wrap. The patient projected a healthy mental status and was not accompanied by family for today's visit. Her last pap smear was completed in April of 2016. She presented with confusion as she was scheduled for two appointments with Dr. Aldean Ast, MD in November of 2016 and December of 2016. This concern was addressed.   ALLERGIES:  is allergic to bactrim and ciprofloxacin.  Meds: Current Outpatient Prescriptions  Medication Sig Dispense Refill  . Brinzolamide-Brimonidine (SIMBRINZA) 1-0.2 % SUSP Place 1 drop into both eyes 2 (two) times daily.     . ferrous fumarate (HEMOCYTE - 106 MG FE) 325 (106 FE) MG TABS tablet Take 1 tablet (106 mg of iron total) by mouth daily. 30 each 3  . gabapentin (NEURONTIN) 100 MG capsule Take one tablet every am in addition to the 300 mg tablet at bedtime. 30 capsule 1  . gabapentin (NEURONTIN) 300 MG capsule Take 1 capsule (300 mg total) by mouth at bedtime. For neuropathy.  (Patient not taking: Reported on 12/07/2014) 30 capsule 1   No current facility-administered medications for this encounter.    Physical Findings: The patient is in no acute distress. Patient is alert and oriented. There is no significant changes to the status of overall health to be noted at this time.  height is 5\' 2"  (1.575 m) and weight is 133 lb 6.4 oz (60.51 kg). Her oral temperature is 98.7 F (37.1 C). Her blood pressure is 141/77 and her pulse is 77. Her respiration is 18.  Lungs are clear to auscultation bilaterally. Heart has regular rate and rhythm. No palpable cervical, supraclavicular, or axillary adenopathy. Abdomen soft. No inguinal adenopathy. A 3-4 mm sebaceous cyst on the right pubis. External genitlia unremarkable. Speculum exam performed. No mucosal lesions. Radiation changes are observed in the proximal vagina. This area is prone to bleeding upon exerting pressure. On bimanual examination rectal and vaginal exam, no masses appreciated.  Lab Findings: Lab Results  Component Value Date   WBC 6.4 10/26/2014   HGB 11.1* 10/26/2014   HCT 34.7* 10/26/2014   MCV 95.3 10/26/2014   PLT 311 10/26/2014    Radiographic Findings: CT Chest with Contrast   Impression: Ebony Herring is a 81 year old female presenting to clinic in regards to her Stage IIIC2 endometrial cancer. No evidence of recurrence was observed on clinical exam today. A pap smear was not required as her last pap smear was completed in April of 2016. A speculum exam was completed  today (12/07/2014). She is managing reported symptoms appropriately. She understands the purpose and benefits of using her vaginal dilator at least three times a week. The patient understands that she can access her appointments and medical records via [facility].  Plan:  Healthy methods of management in regards to reported symptoms were reviewed. The patient is advised to continue the use of her vaginal dilator. She is advised to attend her  appointment with Dr. Aldean Ast, MD for follow-up in January of 2016. She understands that if she observes any additional vaginal bleeding or discomfort moving forward, or if the sebaceous cyst enlarges or causes pain, to contact Dr. Sondra Come, MD. All vocalized questions and concerns have been addressed. If the patient develops any further questions or concerns in regards to her treatment and recovery, she has been encouraged to contact Dr. Sondra Come, MD. She is aware of her follow-up appointment with radiation oncology to take place as scheduled in April of 2017.   This document serves as a record of services personally performed by Gery Pray, MD. It was created on his behalf by Lenn Cal, a trained medical scribe. The creation of this record is based on the scribe's personal observations and the provider's statements to them. This document has been checked and approved by the attending provider.   -----------------------------------  Blair Promise, PhD, MD

## 2014-12-07 NOTE — Progress Notes (Signed)
Ebony Herring here for follow up.  She denies pain.  She reports the neuropathy in her feet is improving on gabapentin.  She denies bladder and bowel issues.  She reports having a spot of vaginal bleeding after using her vaginal dilator a couple of weeks ago.  She is using the dilator three times a week.  She reports that her appetite and energy level is improving.  BP 141/77 mmHg  Pulse 77  Temp(Src) 98.7 F (37.1 C) (Oral)  Resp 18  Ht 5\' 2"  (1.575 m)  Wt 133 lb 6.4 oz (60.51 kg)  BMI 24.39 kg/m2

## 2014-12-14 ENCOUNTER — Ambulatory Visit: Payer: Self-pay | Admitting: Gynecology

## 2014-12-18 ENCOUNTER — Encounter: Payer: Self-pay | Admitting: Family Medicine

## 2014-12-18 ENCOUNTER — Ambulatory Visit (INDEPENDENT_AMBULATORY_CARE_PROVIDER_SITE_OTHER): Payer: Medicare Other | Admitting: Family Medicine

## 2014-12-18 VITALS — BP 122/64 | HR 70 | Temp 98.5°F | Resp 14 | Ht 62.0 in | Wt 134.0 lb

## 2014-12-18 DIAGNOSIS — Z23 Encounter for immunization: Secondary | ICD-10-CM

## 2014-12-18 DIAGNOSIS — D6481 Anemia due to antineoplastic chemotherapy: Secondary | ICD-10-CM | POA: Diagnosis not present

## 2014-12-18 DIAGNOSIS — E785 Hyperlipidemia, unspecified: Secondary | ICD-10-CM | POA: Diagnosis not present

## 2014-12-18 DIAGNOSIS — Z13220 Encounter for screening for lipoid disorders: Secondary | ICD-10-CM

## 2014-12-18 DIAGNOSIS — G620 Drug-induced polyneuropathy: Secondary | ICD-10-CM

## 2014-12-18 DIAGNOSIS — T451X5A Adverse effect of antineoplastic and immunosuppressive drugs, initial encounter: Secondary | ICD-10-CM | POA: Diagnosis not present

## 2014-12-18 LAB — CBC WITH DIFFERENTIAL/PLATELET
BASOS ABS: 0.1 10*3/uL (ref 0.0–0.1)
Basophils Relative: 1 % (ref 0–1)
EOS PCT: 3 % (ref 0–5)
Eosinophils Absolute: 0.2 10*3/uL (ref 0.0–0.7)
HEMATOCRIT: 34.1 % — AB (ref 36.0–46.0)
Hemoglobin: 11 g/dL — ABNORMAL LOW (ref 12.0–15.0)
LYMPHS PCT: 23 % (ref 12–46)
Lymphs Abs: 1.4 10*3/uL (ref 0.7–4.0)
MCH: 30.4 pg (ref 26.0–34.0)
MCHC: 32.3 g/dL (ref 30.0–36.0)
MCV: 94.2 fL (ref 78.0–100.0)
MPV: 9.2 fL (ref 8.6–12.4)
Monocytes Absolute: 0.5 10*3/uL (ref 0.1–1.0)
Monocytes Relative: 8 % (ref 3–12)
NEUTROS PCT: 65 % (ref 43–77)
Neutro Abs: 4 10*3/uL (ref 1.7–7.7)
PLATELETS: 331 10*3/uL (ref 150–400)
RBC: 3.62 MIL/uL — AB (ref 3.87–5.11)
RDW: 13.9 % (ref 11.5–15.5)
WBC: 6.2 10*3/uL (ref 4.0–10.5)

## 2014-12-18 LAB — LIPID PANEL
CHOLESTEROL: 239 mg/dL — AB (ref 125–200)
HDL: 40 mg/dL — ABNORMAL LOW (ref >=46)
LDL CALC: 143 mg/dL — AB (ref <130)
TRIGLYCERIDES: 282 mg/dL — AB (ref <150)
Total CHOL/HDL Ratio: 6 Ratio — ABNORMAL HIGH (ref <=5.0)
VLDL: 56 mg/dL — ABNORMAL HIGH (ref <30)

## 2014-12-18 LAB — COMPREHENSIVE METABOLIC PANEL
ALK PHOS: 70 U/L (ref 33–130)
ALT: 5 U/L — AB (ref 6–29)
AST: 12 U/L (ref 10–35)
Albumin: 4 g/dL (ref 3.6–5.1)
BUN: 19 mg/dL (ref 7–25)
CHLORIDE: 104 mmol/L (ref 98–110)
CO2: 26 mmol/L (ref 20–31)
CREATININE: 1.01 mg/dL — AB (ref 0.60–0.88)
Calcium: 8.8 mg/dL (ref 8.6–10.4)
Glucose, Bld: 93 mg/dL (ref 70–99)
POTASSIUM: 4.8 mmol/L (ref 3.5–5.3)
Sodium: 142 mmol/L (ref 135–146)
Total Bilirubin: 0.3 mg/dL (ref 0.2–1.2)
Total Protein: 6.5 g/dL (ref 6.1–8.1)

## 2014-12-18 NOTE — Assessment & Plan Note (Signed)
Recheck renal function, reviewed last Oncology note about neuropathy, her PAP is UTD from April from her Gyn/Onc as well. Recheck labs. In general doing well. Flu shot given

## 2014-12-18 NOTE — Progress Notes (Signed)
Patient ID: Ebony Herring, female   DOB: July 12, 1931, 81 y.o.   MRN: [id number]   Subjective:    Patient ID: Ebony Herring, female    DOB: 01-Dec-1931, 81 y.o.   MRN: [id number]  Patient presents for 4 month F/U  Pt here to f/u chronic medical problems, she has done quite well since treatment for her Endometrial Cancer. She does suffer with peripheral neuropathy due to her chemotherapy treatments, her gabapentin was recently increased to 100mg  in AM as well as 300mg  at bedtime and she feels some improvement.   She has history of diarrhea after treatment that has resolved, appetite is back , weight up 4lbs  CHemo induced anemia- needs repeat labs and renal function/ recurrent hypokalemia was also a problem     Review Of Systems:  GEN- denies fatigue, fever, weight loss,weakness, recent illness HEENT- denies eye drainage, change in vision, nasal discharge, CVS- denies chest pain, palpitations RESP- denies SOB, cough, wheeze ABD- denies N/V, change in stools, abd pain GU- denies dysuria, hematuria, dribbling, incontinence MSK- denies joint pain, muscle aches, injury Neuro- denies headache, dizziness, syncope, seizure activity       Objective:    BP 122/64 mmHg  Pulse 70  Temp(Src) 98.5 F (36.9 C) (Oral)  Resp 14  Ht 5\' 2"  (1.575 m)  Wt 134 lb (60.782 kg)  BMI 24.50 kg/m2 GEN- NAD, alert and oriented x3 HEENT- PERRL, EOMI, non injected sclera, pink conjunctiva, MMM, oropharynx clear Neck- Supple, no thyromegaly CVS- RRR, no murmur RESP-CTAB ABD-NABS,soft,NT,ND EXT- No edema Pulses- Radial, DP- 2+        Assessment & Plan:      Problem List Items Addressed This Visit    None      Note: This dictation was prepared with Dragon dictation along with smaller phrase technology. Any transcriptional errors that result from this process are unintentional.

## 2014-12-18 NOTE — Patient Instructions (Signed)
Continue current medications Flu shot We will call with lab results F/U as needed or in 6 months

## 2014-12-20 ENCOUNTER — Telehealth: Payer: Self-pay | Admitting: Oncology

## 2014-12-20 ENCOUNTER — Other Ambulatory Visit: Payer: Self-pay | Admitting: Oncology

## 2014-12-20 NOTE — Telephone Encounter (Signed)
ried to reach pt to advise of no lab just flush needed and she has no [employer]

## 2014-12-21 ENCOUNTER — Other Ambulatory Visit: Payer: Medicare Other

## 2014-12-21 ENCOUNTER — Encounter: Payer: Self-pay | Admitting: Oncology

## 2014-12-21 ENCOUNTER — Ambulatory Visit (HOSPITAL_BASED_OUTPATIENT_CLINIC_OR_DEPARTMENT_OTHER): Payer: Medicare Other

## 2014-12-21 ENCOUNTER — Ambulatory Visit (HOSPITAL_BASED_OUTPATIENT_CLINIC_OR_DEPARTMENT_OTHER): Payer: Medicare Other | Admitting: Oncology

## 2014-12-21 VITALS — BP 139/66 | HR 76 | Temp 99.2°F | Resp 18 | Ht 62.0 in | Wt 134.5 lb

## 2014-12-21 DIAGNOSIS — G622 Polyneuropathy due to other toxic agents: Secondary | ICD-10-CM

## 2014-12-21 DIAGNOSIS — T451X5A Adverse effect of antineoplastic and immunosuppressive drugs, initial encounter: Secondary | ICD-10-CM

## 2014-12-21 DIAGNOSIS — C569 Malignant neoplasm of unspecified ovary: Secondary | ICD-10-CM

## 2014-12-21 DIAGNOSIS — Z95828 Presence of other vascular implants and grafts: Secondary | ICD-10-CM

## 2014-12-21 DIAGNOSIS — C541 Malignant neoplasm of endometrium: Secondary | ICD-10-CM | POA: Diagnosis not present

## 2014-12-21 DIAGNOSIS — Z452 Encounter for adjustment and management of vascular access device: Secondary | ICD-10-CM | POA: Diagnosis not present

## 2014-12-21 DIAGNOSIS — D6481 Anemia due to antineoplastic chemotherapy: Secondary | ICD-10-CM | POA: Diagnosis not present

## 2014-12-21 MED ORDER — SODIUM CHLORIDE 0.9 % IJ SOLN
10.0000 mL | INTRAMUSCULAR | Status: DC | PRN
  Administered 2014-12-21: 10 mL via INTRAVENOUS
  Filled 2014-12-21: qty 10

## 2014-12-21 MED ORDER — HEPARIN SOD (PORK) LOCK FLUSH 100 UNIT/ML IV SOLN
500.0000 [IU] | Freq: Once | INTRAVENOUS | Status: AC
  Administered 2014-12-21: 500 [IU] via INTRAVENOUS
  Filled 2014-12-21: qty 5

## 2014-12-21 MED ORDER — ALTEPLASE 2 MG IJ SOLR
2.0000 mg | Freq: Once | INTRAMUSCULAR | Status: AC | PRN
  Administered 2014-12-21: 2 mg
  Filled 2014-12-21: qty 2

## 2014-12-21 NOTE — Progress Notes (Signed)
OFFICE PROGRESS NOTE   December 21, 2014   Physicians:Daniel ClarkePearson, [place], [place] F (PCP [place] [facility]), James Kinard, James Tomblin, David Miller (ophth)  INTERVAL HISTORY:   Patient is seen, alone for visit, in scheduled follow up of IIIC high grade serous endometrial carcinoma, on observation since she completed adjuvant chemotherapy and radiation 10-03-2013. She saw Dr Kinard in 11-2014, will see Dr ClarkePearson again in 02-2015 and Dr Kinard 05-2015. Last imaging was CT CAP 11-2013. She still has [name] in, which has needed TPA again today. We have discussed removing this after next visit to Dr ClarkePearson in Jan if she is doing well then She saw PCP yesterday, no changes other than flu vaccine done  Patient has been feeling entirely well, with no complaints that seem referable to the gyn cancer history or that treatment. Energy and appetite are good. Bowels are moving well. She has no abdominal or pelvic pain. She denies bleeding, bladder symptoms, LE swelling or pain, SOB or other respiratory symptoms. [name] is not uncomfortable. Remainder of 10 point Review of Systems negative.  [name] in, see above. Flu vaccine 12-18-14  ONCOLOGIC HISTORY Patient presented to PCP with vaginal discharge with some bleeding since Nov 2014. She was referred to Dr Tomblin, with endometrial biopsy 01-26-2013 showing high grade endometrial carcinoma with serous features. MRI pelvis 01-20-13 Had uterine mass and prominent bilateral iliac nodes. She had TAH BSO and pelvic and para-aortic lymphadenectomy by Dr ClarkePearson on 03-01-2013. Pathology ([id number]) had high grade serous carcinoma of endometrium extending to outer half of myometrium, with extensive angiolymphatic invasion, involvement of right ovary, involvement of 2/9 pelvic nodes and 3/4 para-aortic nodes. At completion of surgery there was no gross residual disease. She had 3 cycles of taxol carboplatin from 03-28-13 thru 05-09-2013, with neupogen  support. She had pelvic RT 45 Gy thru ~ 07-05-13 and HDR x4 thru 07-27-13. Chemo resumed with taxol carboplatin cycle 4 on 08-22-13, with neupogen support, for total 6 cycles thru 10-03-13. CT CAP 11-14-13 showed no recurrent or metastatic gyn cancer.     Objective:  Vital signs in last 24 hours:  BP 139/66 mmHg  Pulse 76  Temp(Src) 99.2 F (37.3 C) (Oral)  Resp 18  Ht 5' 2" (1.575 m)  Wt 134 lb 8 oz (61.009 kg)  BMI 24.59 kg/m2  SpO2 98% Weight stable. Looks comfortable, respirations not labored.  Alert, oriented and appropriate. Ambulatory without  difficulty.   HEENT:PERRL, sclerae not icteric. Oral mucosa moist without lesions, posterior pharynx clear.  Neck supple. No JVD.  Lymphatics:no cervical,supraclavicular, axillary or inguinal adenopathy Resp: clear to auscultation bilaterally and normal percussion bilaterally Cardio: regular rate and rhythm. No gallop. GI: soft, nontender, not distended, no mass or organomegaly. Normally active bowel sounds. Surgical incision not remarkable. Musculoskeletal/ Extremities: without pitting edema, cords, tenderness Neuro: no peripheral neuropathy. Otherwise nonfocal. PSYCH appropriate mood and affect Skin without rash, ecchymosis, petechiae Breasts: without dominant mass, skin or nipple findings. Axillae benign. Portacath-without erythema or tenderness  Lab Results:  Results for orders placed or performed in visit on 12/18/14  CBC with Differential/Platelet  Result Value Ref Range   WBC 6.2 4.0 - 10.5 K/uL   RBC 3.62 (L) 3.87 - 5.11 MIL/uL   Hemoglobin 11.0 (L) 12.0 - 15.0 g/dL   HCT 34.1 (L) 36.0 - 46.0 %   MCV 94.2 78.0 - 100.0 fL   MCH 30.4 26.0 - 34.0 pg   MCHC 32.3 30.0 - 36.0 g/dL   RDW 13.9 11.5 -   15.5 %   Platelets 331 150 - 400 K/uL   MPV 9.2 8.6 - 12.4 fL   Neutrophils Relative % 65 43 - 77 %   Neutro Abs 4.0 1.7 - 7.7 K/uL   Lymphocytes Relative 23 12 - 46 %   Lymphs Abs 1.4 0.7 - 4.0 K/uL   Monocytes Relative 8 3 -  12 %   Monocytes Absolute 0.5 0.1 - 1.0 K/uL   Eosinophils Relative 3 0 - 5 %   Eosinophils Absolute 0.2 0.0 - 0.7 K/uL   Basophils Relative 1 0 - 1 %   Basophils Absolute 0.1 0.0 - 0.1 K/uL   Smear Review Criteria for review not met   Comprehensive metabolic panel  Result Value Ref Range   Sodium 142 135 - 146 mmol/L   Potassium 4.8 3.5 - 5.3 mmol/L   Chloride 104 98 - 110 mmol/L   CO2 26 20 - 31 mmol/L   Glucose, Bld 93 70 - 99 mg/dL   BUN 19 7 - 25 mg/dL   Creat 1.01 (H) 0.60 - 0.88 mg/dL   Total Bilirubin 0.3 0.2 - 1.2 mg/dL   Alkaline Phosphatase 70 33 - 130 U/L   AST 12 10 - 35 U/L   ALT 5 (L) 6 - 29 U/L   Total Protein 6.5 6.1 - 8.1 g/dL   Albumin 4.0 3.6 - 5.1 g/dL   Calcium 8.8 8.6 - 10.4 mg/dL  Lipid panel  Result Value Ref Range   Cholesterol 239 (H) 125 - 200 mg/dL   Triglycerides 282 (H) <150 mg/dL   HDL 40 (L) >=46 mg/dL   Total CHOL/HDL Ratio 6.0 (H) <=5.0 Ratio   VLDL 56 (H) <30 mg/dL   LDL Cholesterol 143 (H) <130 mg/dL     Studies/Results:  No results found.  Medications: I have reviewed the patient's current medications.  DISCUSSION: tho it is possible that she could need chemotherapy in future, if she is doing well when sees Dr Josephina Shih next in Jan, I would be in favor of having [name] removed by IR then, to save flushes and lessen risk of infection from port. Patient understands these considerations and would like [name] removed if appropriate in Jan.    Assessment/Plan:  1.IIIC2 high grade serous endometrial carcinoma: surgery 03-01-13, 3 cycles of taxol carboplatin 03-28-13 thru 05-09-13 with gCSF required, pelvic RT + HDR by Dr Sondra Come completed 07-27-13, and 3 additional cycles of taxol and carboplatin completed 10-03-13. Clinically doing well. Will try to alternate visits with other MDs. 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 Try gabapentin at hs. 3.[name] in. Flush here q 6-8 weeks, some difficulty last flush and today, tho  both resolved. Tho she may need chemo if recurrent disease, seems reasonable to DC if still doing well when she sees Dr Josephina Shih next.  4. Flu vaccine 12-18-14. 5.anemia: related to chemo and RT. Better than during treatment, continue oral iron. Other counts good. 6.no mammograms in years, no colonoscopy. She does not like medical interventions. 7.glaucoma   All questions answered and patient is in agreement with recommendations as above. If [name] out, and as she is followed so closely by gyn onc, rad onc and PCP, I could see back prn.   Time spent 15 min including >50% counseling and coordination of care.   Analiah Drum P, MD   12/21/2014, 5:13 PM

## 2014-12-21 NOTE — Progress Notes (Signed)
Pt in for [name] flush today. Port accessed, flushes well, no blood return noted. Pt states last time she had to have TPA administered because of no blood return. Pt denies any pain or discomfort with port. Spoke with Dr. Marko Plume who would like TPA administered. Erline Levine, RN administered at [phone] with no difficulties. Left message with Erasmo Downer, RN with Marko Plume to check at 1335. Pt arrived to MD appt.

## 2014-12-21 NOTE — Addendum Note (Signed)
Addended by: Christa See on: 12/21/2014 01:39 PM   Modules accepted: Orders, SmartSet

## 2014-12-21 NOTE — Progress Notes (Signed)
1335 - Blood return noted from [facility]. [name] flushed and deaccessed.

## 2014-12-21 NOTE — Patient Instructions (Signed)

## 2014-12-23 DIAGNOSIS — Z95828 Presence of other vascular implants and grafts: Secondary | ICD-10-CM | POA: Insufficient documentation

## 2014-12-29 ENCOUNTER — Other Ambulatory Visit: Payer: Self-pay | Admitting: *Deleted

## 2014-12-29 DIAGNOSIS — G620 Drug-induced polyneuropathy: Secondary | ICD-10-CM

## 2014-12-29 DIAGNOSIS — C541 Malignant neoplasm of endometrium: Secondary | ICD-10-CM

## 2014-12-29 DIAGNOSIS — T451X5A Adverse effect of antineoplastic and immunosuppressive drugs, initial encounter: Principal | ICD-10-CM

## 2014-12-29 MED ORDER — GABAPENTIN 300 MG PO CAPS: 300.0000 mg | ORAL_CAPSULE | Freq: Every day | ORAL | Status: DC

## 2015-01-16 ENCOUNTER — Other Ambulatory Visit: Payer: Self-pay

## 2015-01-16 DIAGNOSIS — D509 Iron deficiency anemia, unspecified: Secondary | ICD-10-CM

## 2015-01-16 DIAGNOSIS — C541 Malignant neoplasm of endometrium: Secondary | ICD-10-CM

## 2015-01-16 MED ORDER — FERROUS FUMARATE 325 (106 FE) MG PO TABS: 1.0000 | ORAL_TABLET | Freq: Every day | ORAL | Status: DC

## 2015-01-19 ENCOUNTER — Ambulatory Visit: Payer: Medicare Other | Admitting: Gynecology

## 2015-01-29 ENCOUNTER — Other Ambulatory Visit: Payer: Self-pay

## 2015-01-29 DIAGNOSIS — G620 Drug-induced polyneuropathy: Secondary | ICD-10-CM

## 2015-01-29 DIAGNOSIS — T451X5A Adverse effect of antineoplastic and immunosuppressive drugs, initial encounter: Principal | ICD-10-CM

## 2015-01-29 MED ORDER — GABAPENTIN 100 MG PO CAPS: ORAL_CAPSULE | ORAL | Status: DC

## 2015-02-23 ENCOUNTER — Other Ambulatory Visit (HOSPITAL_COMMUNITY)
Admission: RE | Admit: 2015-02-23 | Discharge: 2015-02-23 | Disposition: A | Discharge status: Home / Self Care | Payer: Medicare Other | Source: Ambulatory Visit | Attending: Gynecology | Admitting: Gynecology

## 2015-02-23 ENCOUNTER — Inpatient Hospital Stay (HOSPITAL_COMMUNITY): Admit: 2015-02-23 | Payer: Self-pay

## 2015-02-23 ENCOUNTER — Encounter: Payer: Self-pay | Admitting: Gynecology

## 2015-02-23 ENCOUNTER — Telehealth: Payer: Self-pay

## 2015-02-23 ENCOUNTER — Telehealth: Payer: Self-pay | Admitting: Oncology

## 2015-02-23 ENCOUNTER — Ambulatory Visit: Payer: Medicare Other | Attending: Gynecology | Admitting: Gynecology

## 2015-02-23 VITALS — BP 128/69 | HR 74 | Temp 98.3°F | Resp 18 | Wt 138.4 lb

## 2015-02-23 DIAGNOSIS — Z8542 Personal history of malignant neoplasm of other parts of uterus: Secondary | ICD-10-CM

## 2015-02-23 DIAGNOSIS — C541 Malignant neoplasm of endometrium: Secondary | ICD-10-CM | POA: Insufficient documentation

## 2015-02-23 DIAGNOSIS — Z124 Encounter for screening for malignant neoplasm of cervix: Secondary | ICD-10-CM | POA: Diagnosis not present

## 2015-02-23 DIAGNOSIS — Z01419 Encounter for gynecological examination (general) (routine) without abnormal findings: Secondary | ICD-10-CM | POA: Diagnosis not present

## 2015-02-23 DIAGNOSIS — G629 Polyneuropathy, unspecified: Secondary | ICD-10-CM

## 2015-02-23 DIAGNOSIS — N898 Other specified noninflammatory disorders of vagina: Secondary | ICD-10-CM

## 2015-02-23 NOTE — Patient Instructions (Signed)
We will call you with the results of your pap smear and biopsy from today.  Dr. Fermin Schwab will be here next Friday in case we need to see you in the office.  Please call for any questions or concerns.  Plan to see Dr. Sondra Come in April and Dr. Marko Plume in July if everything is clear on your pap smear and biopsy.

## 2015-02-23 NOTE — Progress Notes (Signed)
Consult Note: Gyn-Onc   Isa J Mazzaferro 82 y.o. female  No chief complaint on file.   Assessment : Stage III C. poorly differentiated endometrial carcinoma. Clinically free of disease. mild peripheral neuropathy in the feet. Newly lesion at the apex of the vagina.  Plan:    Pap smear and attempted biopsy was obtained. Honestly is unable to get a satisfactory specimen on biopsy.   We will await the results of the Pap smear and biopsy before making any further recommendations. The patient schedule see Dr.Kinard at approximate 3 months. Thereafter she should see Dr. Marko Plume in 3 months and 3 months later see GYN oncology. The patient will contact us if she has any symptoms of possible recurrent disease.  Interval history: The patient returns today for continuing follow-up. She reports she's been doing well with no GI or GU symptoms. She denies any pelvic pain pressure vaginal bleeding or discharge. She has good functional status although she does note mild peripheral neuropathy in her feet which is gradually gotten slightly better. She is taking vitamin B 6.    HPI: 82 year old white married female seen in consultation at the request of Dr. Gaetano Net regarding the management of a newly diagnosed high-grade endometrial carcinoma. The patient's history dates back to approximately November  2014 when she developed of vaginal discharge. Overtime some blood was noted in the discharge. Ultimately the patient was referred to Dr. Gertie Fey for further evaluation. An endometrial biopsy was obtained on 01/26/2013 showed a high-grade endometrial carcinoma with serous features. Subsequently the patient's had an MRI of the pelvis showing a fibroid uterus and prominent bilateral iliac lymph nodes measuring between 1 cm and 7 mm. Currently the patient is having minimal bleeding and no significant pain.  Past gynecologic history includes a bilateral tubal ligation. Obstetrical history gravida 2.  Patient underwent  exploratory laparotomy total abdominal hysterectomy bilateral salpingo-oophorectomy pelvic and para-aortic lymphadenectomy on 03/01/2013. Final pathology showed a invasive high-grade serous carcinoma with extensive lymphatic invasion, metastasis to the right ovary. 2 of 9 pelvic lymph nodes and 3 of 4 para-aortic lymph nodes were positive  . "Sandwich" therapy using carboplatin and Taxol and extended field radiation therapy was recommended. All therapy was completed in September 2015. At the completion of therapy CT scan was negative.    Review of Systems:10 point review of systems is negative except as noted in interval history.   Vitals: Blood pressure 128/69, pulse 74, temperature 98.3 F (36.8 C), temperature source Oral, resp. rate 18, weight 138 lb 6.4 oz (62.778 kg), SpO2 100 %.  Physical Exam: General : The patient is a healthy woman in no acute distress.  HEENT: normocephalic, extraoccular movements normal; neck is supple without thyromegally  Lynphnodes: Supraclavicular and inguinal nodes not enlarged  Abdomen: Soft, non-tender, no ascites, no organomegally, no masses, no hernias she has a well-healed scar at McBurney's point and small incisions from her tubal ligation and laparoscopic cholecystectomy, incision is well-healed. Pelvic:  EGBUS: Normal female  Vagina: Normal,  In the central area of the vaginal cuff is a raised 5-6 mm lesion. It is slightly friable. Pap smears obtained. I had difficulty obtaining satisfactory biopsy. Urethra and Bladder: Normal, non-tender  Cervix: Surgically absent Uterus: Surgically absent. Bi-manual examination: Non-tender; no adenxal masses or nodularity  Rectal: normal sphincter tone, no masses, no blood  Lower extremities: No edema or varicosities. Normal range of motion      Allergies  Allergen Reactions  . Bactrim [Sulfamethoxazole-Trimethoprim] Other (See Comments)  Made her see things  . Ciprofloxacin Other (See Comments)    Made  her see things    Past Medical History  Diagnosis Date  . Glaucoma     High pressure- ?pre glaucoma  . Cancer [facility])     ENDOMETRIAL CANCER  . Radiation 06/01/13-07/07/13    45 gray to pelvis and periaortic region  . History of brachytherapy 07/13/13, 07/20/13, 07/27/13    vaginal cuff boost 18 gray    Past Surgical History  Procedure Laterality Date  . Cholecystectomy    . Appendectomy    . Abdominal hysterectomy N/A 03/01/2013    Procedure: HYSTERECTOMY ABDOMINAL TOTAL/BILATERAL SALPINGO OOPHORECTOMY/WITH PELVIC AND PARA AORTIC LYMPHADNECTOMY ;  Surgeon: Alvino Chapel, MD;  Location: [facility]S;  Service: Gynecology;  Laterality: N/A;  . Salpingoophorectomy Bilateral 03/01/2013    Procedure: BILATERAL SALPINGO OOPHORECTOMY;  Surgeon: Alvino Chapel, MD;  Location: [facility] ORS;  Service: Gynecology;  Laterality: Bilateral;    Current Outpatient Prescriptions  Medication Sig Dispense Refill  . Brinzolamide-Brimonidine (SIMBRINZA) 1-0.2 % SUSP Place 1 drop into both eyes 2 (two) times daily.     Marland Kitchen FERROCITE 324 MG TABS Take 1 tablet by mouth daily.  4  . gabapentin (NEURONTIN) 100 MG capsule Take one tablet every am in addition to the 300 mg tablet at bedtime. 30 capsule 1  . gabapentin (NEURONTIN) 300 MG capsule Take 1 capsule (300 mg total) by mouth at bedtime. For neuropathy. 30 capsule 1   No current facility-administered medications for this visit.    Social History   Social History  . Marital Status: Married    Spouse Name: N/A  . Number of Children: 1  . Years of Education: N/A   Occupational History  . Not on file.   Social History Main Topics  . Smoking status: Never Smoker   . Smokeless tobacco: Never Used  . Alcohol Use: No  . Drug Use: No  . Sexual Activity: No   Other Topics Concern  . Not on file   Social History Narrative    Family History  Problem Relation Age of Onset  . Breast cancer Maternal Aunt     x 4      [name],Dai Apel  L, MD 02/23/2015, 11:50 AM

## 2015-02-23 NOTE — Telephone Encounter (Signed)
-----   Message from Gordy Levan, MD sent at 12/23/2014  3:03 PM EST ----- Could have IR take out [facility] after she sees Dr Josephina Shih 02-23-15 as long as doing well then  If [name] out, I can see back just prn  Thanks

## 2015-02-23 NOTE — Telephone Encounter (Signed)
Ebony Herring had a PAP Smear and biopsy done today so not sure if [name] can come out.   She needs to have [name] Flush as last one 12-21-14. She is agreeable to have [name] flushed next week.  Scheduled flush for 02-27-15 at 1430.

## 2015-02-23 NOTE — Telephone Encounter (Signed)
July follow up made per dr Fermin Schwab and a calendar has been printed

## 2015-02-25 ENCOUNTER — Other Ambulatory Visit: Payer: Self-pay

## 2015-02-25 DIAGNOSIS — T451X5A Adverse effect of antineoplastic and immunosuppressive drugs, initial encounter: Principal | ICD-10-CM

## 2015-02-25 DIAGNOSIS — C541 Malignant neoplasm of endometrium: Secondary | ICD-10-CM

## 2015-02-25 DIAGNOSIS — G620 Drug-induced polyneuropathy: Secondary | ICD-10-CM

## 2015-02-25 MED ORDER — GABAPENTIN 300 MG PO CAPS: 300.0000 mg | ORAL_CAPSULE | Freq: Every day | ORAL | Status: DC

## 2015-02-27 ENCOUNTER — Telehealth: Payer: Self-pay | Admitting: Oncology

## 2015-02-27 LAB — CYTOLOGY - PAP

## 2015-02-27 NOTE — Telephone Encounter (Signed)
Appointments for flush rescheduled per patient

## 2015-02-28 ENCOUNTER — Telehealth: Payer: Self-pay | Admitting: Gynecologic Oncology

## 2015-02-28 NOTE — Telephone Encounter (Signed)
Patient notified of pap and biopsy results.  No concerns voiced.  Advised to call for any needs or concerns.

## 2015-03-01 ENCOUNTER — Ambulatory Visit (HOSPITAL_BASED_OUTPATIENT_CLINIC_OR_DEPARTMENT_OTHER): Payer: Medicare Other

## 2015-03-01 VITALS — BP 152/85 | HR 82 | Temp 98.9°F | Resp 18

## 2015-03-01 DIAGNOSIS — C541 Malignant neoplasm of endometrium: Secondary | ICD-10-CM

## 2015-03-01 DIAGNOSIS — Z452 Encounter for adjustment and management of vascular access device: Secondary | ICD-10-CM

## 2015-03-01 DIAGNOSIS — Z95828 Presence of other vascular implants and grafts: Secondary | ICD-10-CM

## 2015-03-01 MED ORDER — HEPARIN SOD (PORK) LOCK FLUSH 100 UNIT/ML IV SOLN
500.0000 [IU] | Freq: Once | INTRAVENOUS | Status: AC
  Administered 2015-03-01: 500 [IU] via INTRAVENOUS
  Filled 2015-03-01: qty 5

## 2015-03-01 MED ORDER — SODIUM CHLORIDE 0.9 % IJ SOLN
10.0000 mL | INTRAMUSCULAR | Status: DC | PRN
  Administered 2015-03-01: 10 mL via INTRAVENOUS
  Filled 2015-03-01: qty 10

## 2015-03-01 NOTE — Patient Instructions (Signed)

## 2015-03-23 ENCOUNTER — Telehealth: Payer: Self-pay

## 2015-03-23 NOTE — Telephone Encounter (Signed)
Pt was questioning if we can take out her Port. She was unsure as of 1/13 d/t pap smear and biopsy done by Clark-Pearson. She did not totally understand phone call from [place] last week d/t husband had TV on loud.

## 2015-03-29 NOTE — Telephone Encounter (Signed)
Would keep [name] at least until after she has exam by Dr Sondra Come in April. Needs to keep [name] flushed every 6-8 weeks in interim - please be sure apts set up. If nothing of concern when Dr Sondra Come sees her in April, ok to have [name] removed.  Ebony Herring

## 2015-04-02 NOTE — Telephone Encounter (Addendum)
S/w pt per Dr Mariana Kaufman. Pt is agreeable.

## 2015-04-03 ENCOUNTER — Telehealth: Payer: Self-pay | Admitting: *Deleted

## 2015-04-03 DIAGNOSIS — T451X5A Adverse effect of antineoplastic and immunosuppressive drugs, initial encounter: Principal | ICD-10-CM

## 2015-04-03 DIAGNOSIS — G620 Drug-induced polyneuropathy: Secondary | ICD-10-CM

## 2015-04-03 DIAGNOSIS — C541 Malignant neoplasm of endometrium: Secondary | ICD-10-CM

## 2015-04-03 MED ORDER — GABAPENTIN 100 MG PO CAPS: ORAL_CAPSULE | ORAL | Status: DC

## 2015-04-03 MED ORDER — GABAPENTIN 300 MG PO CAPS: 300.0000 mg | ORAL_CAPSULE | Freq: Every day | ORAL | Status: DC

## 2015-04-03 NOTE — Telephone Encounter (Signed)
VM message from patient requesting refill on Gabapentin. Done.

## 2015-04-26 ENCOUNTER — Ambulatory Visit (HOSPITAL_BASED_OUTPATIENT_CLINIC_OR_DEPARTMENT_OTHER): Payer: Medicare Other

## 2015-04-26 VITALS — BP 160/72 | HR 87 | Temp 98.9°F | Resp 17

## 2015-04-26 DIAGNOSIS — C541 Malignant neoplasm of endometrium: Secondary | ICD-10-CM | POA: Diagnosis not present

## 2015-04-26 DIAGNOSIS — Z452 Encounter for adjustment and management of vascular access device: Secondary | ICD-10-CM

## 2015-04-26 DIAGNOSIS — Z95828 Presence of other vascular implants and grafts: Secondary | ICD-10-CM

## 2015-04-26 MED ORDER — SODIUM CHLORIDE 0.9% FLUSH
10.0000 mL | INTRAVENOUS | Status: DC | PRN
  Administered 2015-04-26: 10 mL via INTRAVENOUS
  Filled 2015-04-26: qty 10

## 2015-04-26 MED ORDER — HEPARIN SOD (PORK) LOCK FLUSH 100 UNIT/ML IV SOLN
500.0000 [IU] | Freq: Once | INTRAVENOUS | Status: AC
  Administered 2015-04-26: 500 [IU] via INTRAVENOUS
  Filled 2015-04-26: qty 5

## 2015-04-26 NOTE — Patient Instructions (Signed)

## 2015-06-06 ENCOUNTER — Telehealth: Payer: Self-pay

## 2015-06-06 NOTE — Telephone Encounter (Signed)
ENCOUNTER OPENED IN ERROR

## 2015-06-07 ENCOUNTER — Ambulatory Visit: Payer: Self-pay | Admitting: Radiation Oncology

## 2015-06-15 ENCOUNTER — Other Ambulatory Visit: Payer: Self-pay

## 2015-06-15 DIAGNOSIS — G620 Drug-induced polyneuropathy: Secondary | ICD-10-CM

## 2015-06-15 DIAGNOSIS — T451X5A Adverse effect of antineoplastic and immunosuppressive drugs, initial encounter: Principal | ICD-10-CM

## 2015-06-15 MED ORDER — GABAPENTIN 100 MG PO CAPS: ORAL_CAPSULE | ORAL | Status: DC

## 2015-06-22 ENCOUNTER — Ambulatory Visit (HOSPITAL_BASED_OUTPATIENT_CLINIC_OR_DEPARTMENT_OTHER): Payer: Medicare Other

## 2015-06-22 VITALS — BP 152/79 | HR 81 | Temp 97.5°F | Resp 16

## 2015-06-22 DIAGNOSIS — Z452 Encounter for adjustment and management of vascular access device: Secondary | ICD-10-CM | POA: Diagnosis not present

## 2015-06-22 DIAGNOSIS — C541 Malignant neoplasm of endometrium: Secondary | ICD-10-CM | POA: Diagnosis not present

## 2015-06-22 DIAGNOSIS — Z95828 Presence of other vascular implants and grafts: Secondary | ICD-10-CM

## 2015-06-22 MED ORDER — SODIUM CHLORIDE 0.9% FLUSH
10.0000 mL | INTRAVENOUS | Status: DC | PRN
  Administered 2015-06-22: 10 mL via INTRAVENOUS
  Filled 2015-06-22: qty 10

## 2015-06-22 MED ORDER — HEPARIN SOD (PORK) LOCK FLUSH 100 UNIT/ML IV SOLN
500.0000 [IU] | Freq: Once | INTRAVENOUS | Status: AC
  Administered 2015-06-22: 500 [IU] via INTRAVENOUS
  Filled 2015-06-22: qty 5

## 2015-06-22 NOTE — Patient Instructions (Signed)

## 2015-07-04 ENCOUNTER — Ambulatory Visit
Admission: RE | Admit: 2015-07-04 | Discharge: 2015-07-04 | Disposition: A | Discharge status: Home / Self Care | Payer: Medicare Other | Source: Ambulatory Visit | Attending: Radiation Oncology | Admitting: Radiation Oncology

## 2015-07-04 ENCOUNTER — Encounter: Payer: Self-pay | Admitting: Radiation Oncology

## 2015-07-04 ENCOUNTER — Other Ambulatory Visit (HOSPITAL_COMMUNITY)
Admission: RE | Admit: 2015-07-04 | Discharge: 2015-07-04 | Disposition: A | Discharge status: Home / Self Care | Payer: Medicare Other | Source: Ambulatory Visit | Attending: Radiation Oncology | Admitting: Radiation Oncology

## 2015-07-04 VITALS — BP 143/76 | HR 90 | Temp 98.6°F | Resp 18 | Ht 62.0 in | Wt 128.2 lb

## 2015-07-04 DIAGNOSIS — C541 Malignant neoplasm of endometrium: Secondary | ICD-10-CM | POA: Diagnosis not present

## 2015-07-04 DIAGNOSIS — Z01411 Encounter for gynecological examination (general) (routine) with abnormal findings: Secondary | ICD-10-CM | POA: Diagnosis not present

## 2015-07-04 DIAGNOSIS — Z883 Allergy status to other anti-infective agents status: Secondary | ICD-10-CM | POA: Insufficient documentation

## 2015-07-04 NOTE — Progress Notes (Signed)
Clariece Kowalczyk here for follow up.  She denies having pain, bladder issues, bowel issues, vaginal/rectal bleeding or discharge.  She reports her appetite is not as good as it should be.  She reports having a poor energy level.  She reports having a dry cough from sinus drainage that started about 6 weeks ago.  She said it is keeping her up at night.  She is using a dilator "most of the time."    BP 143/76 mmHg  Pulse 90  Temp(Src) 98.6 F (37 C) (Oral)  Resp 18  Ht 5\' 2"  (1.575 m)  Wt 128 lb 3.2 oz (58.151 kg)  BMI 23.44 kg/m2  SpO2 97%   Wt Readings from Last 3 Encounters:  07/04/15 128 lb 3.2 oz (58.151 kg)  02/23/15 138 lb 6.4 oz (62.778 kg)  12/21/14 134 lb 8 oz (61.009 kg)

## 2015-07-04 NOTE — Progress Notes (Signed)
Radiation Oncology         [phone] ________________________________  Name: Ebony Herring MRN: [id number]  Date: 07/04/2015  DOB: [birth year]  Follow-Up Visit Note  CC: Ebony Blackbird, MD  Ebony Levan, MD    ICD-9-CM ICD-10-CM   1. Endometrial cancer ([place]) 182.0 C54.1     Diagnosis: Stage IIIC2 (T3a, N2, M0) endometrial cancer  Interval Since Last Radiation: 1 year and 11 months  07/13/13, 07/20/13, 07/27/13: Intracavitary brachytherapy treatments.  Vaginal cuff boost of 18 gray using high-dose rate techniques,  iridium 192 06/01/13 - 07/07/13: External beam radiation therapy. Pelvis and periaortic region 45 gray in 25 fractions  Narrative:  Ebony Herring presents for her follow-up appointment with radiation onoclogy. She saw Gyn-Onc on 02/23/15. Pap smear obtained at that time was negative for malignancy. An attempted biopsy of a mucosal lesion at the vaginal cuff was performed. No evidence of malignancy  She denies having pain, bladder issues, bowel issues, or vaginal/rectal bleeding or discharge. She reports her appetite is "not as good as it ought to be," a poor energy level, and a dry cough from sinus drainage that started about 6 weeks ago. She said it is keeping her up at night. She is using her dilator "most of the time."  ALLERGIES:  is allergic to bactrim and ciprofloxacin.  Meds: Current Outpatient Prescriptions  Medication Sig Dispense Refill  . Brinzolamide-Brimonidine (SIMBRINZA) 1-0.2 % SUSP Place 1 drop into both eyes 2 (two) times daily.     Marland Kitchen FERROCITE 324 MG TABS Take 1 tablet by mouth daily.  4  . gabapentin (NEURONTIN) 100 MG capsule Take one tablet every am in addition to the 300 mg tablet at bedtime. (Patient not taking: Reported on 07/04/2015) 30 capsule 2  . gabapentin (NEURONTIN) 300 MG capsule Take 1 capsule (300 mg total) by mouth at bedtime. For neuropathy. (Patient not taking: Reported on 07/04/2015) 30 capsule 2   No current facility-administered  medications for this encounter.    Physical Findings: The patient is in no acute distress. Patient is alert and oriented. There is no significant changes to the status of overall health to be noted at this time.  height is 5\' 2"  (1.575 m) and weight is 128 lb 3.2 oz (58.151 kg). Her oral temperature is 98.6 F (37 C). Her blood pressure is 143/76 and her pulse is 90. Her respiration is 18 and oxygen saturation is 97%.  Lungs are clear to auscultation bilaterally. Heart has regular rate and rhythm. No palpable cervical, supraclavicular, or axillary adenopathy. Abdomen soft, non-tender, non-distended.   No inguinal adenopathy. On pelvic examination the external genitalia were unremarkable. A speculum exam was performed. There are no mucosal lesions noted in the vaginal vault. Radiation changes are observed in the proximal vagina. This area is prone to bleeding upon exerting pressure. A Pap smear was obtained of the proximal vagina in light of concern of possible recurrence with the patient's last follow up with Gyn Onc. On bimanual and rectovaginal examination there were no pelvic masses appreciated.  Lab Findings: Lab Results  Component Value Date   WBC 6.2 12/18/2014   HGB 11.0* 12/18/2014   HCT 34.1* 12/18/2014   MCV 94.2 12/18/2014   PLT 331 12/18/2014    Radiographic Findings: No results found.  Impression: Ebony Herring is a 82 y.o. female presenting to clinic in regards to her Stage IIIC2 endometrial cancer. No evidence of recurrence was observed on clinical exam today. A Pap smear was obtained. A  speculum exam was completed today. She is managing reported symptoms appropriately. She understands the purpose and benefits of using her vaginal dilator at least three times a week.  Plan:  Healthy methods of management in regards to reported symptoms were reviewed. She will follow up with Dr. Fermin Schwab in 3 months and myself 3 months after  that.  -----------------------------------  Blair Promise, PhD, MD  This document serves as a record of services personally performed by Gery Pray, MD. It was created on his behalf by Darcus Austin, a trained medical scribe. The creation of this record is based on the scribe's personal observations and the provider's statements to them. This document has been checked and approved by the attending provider.

## 2015-07-06 NOTE — Addendum Note (Signed)
Encounter addended by: Jacqulyn Liner, RN on: 07/06/2015  7:35 AM<BR>     Documentation filed: Charges VN

## 2015-07-11 LAB — CYTOLOGY - PAP

## 2015-07-12 ENCOUNTER — Other Ambulatory Visit: Payer: Self-pay | Admitting: Oncology

## 2015-07-12 DIAGNOSIS — Z95828 Presence of other vascular implants and grafts: Secondary | ICD-10-CM

## 2015-07-17 ENCOUNTER — Telehealth: Payer: Self-pay

## 2015-07-17 NOTE — Telephone Encounter (Signed)
Told Ms. Irani that Dr. Marko Plume said that her [facility] can be removed since her visit with Dr. Sondra Come went well and pap smear showed no evidence of disease. Ms. Niess is in agreement for [name] removal.  Order is in EPIC.  Left Message in [place] vm in IR to confirm pt in agreement with removal and verify patient's phone number as Ms. Gayden does not have voice mail on her phone.

## 2015-07-24 ENCOUNTER — Telehealth: Payer: Self-pay | Admitting: Oncology

## 2015-07-24 NOTE — Telephone Encounter (Signed)
Called Ebony Herring and notified her of the good pap smear result per Dr. Sondra Come.  Ebony Herring verbalized agreement and understanding.

## 2015-07-28 ENCOUNTER — Encounter (HOSPITAL_COMMUNITY): Payer: Self-pay

## 2015-07-28 ENCOUNTER — Inpatient Hospital Stay (HOSPITAL_COMMUNITY)
Admission: EM | Admit: 2015-07-28 | Discharge: 2015-08-04 | DRG: 177 | Disposition: A | Discharge status: Hospice | Payer: Medicare Other | Attending: Internal Medicine | Admitting: Internal Medicine

## 2015-07-28 ENCOUNTER — Emergency Department (HOSPITAL_COMMUNITY): Payer: Medicare Other

## 2015-07-28 ENCOUNTER — Other Ambulatory Visit: Payer: Self-pay

## 2015-07-28 ENCOUNTER — Observation Stay (HOSPITAL_COMMUNITY): Payer: Medicare Other

## 2015-07-28 DIAGNOSIS — R188 Other ascites: Secondary | ICD-10-CM | POA: Diagnosis present

## 2015-07-28 DIAGNOSIS — R627 Adult failure to thrive: Secondary | ICD-10-CM | POA: Diagnosis not present

## 2015-07-28 DIAGNOSIS — E875 Hyperkalemia: Secondary | ICD-10-CM | POA: Diagnosis not present

## 2015-07-28 DIAGNOSIS — J690 Pneumonitis due to inhalation of food and vomit: Secondary | ICD-10-CM | POA: Diagnosis not present

## 2015-07-28 DIAGNOSIS — J9601 Acute respiratory failure with hypoxia: Secondary | ICD-10-CM | POA: Diagnosis not present

## 2015-07-28 DIAGNOSIS — I071 Rheumatic tricuspid insufficiency: Secondary | ICD-10-CM | POA: Diagnosis not present

## 2015-07-28 DIAGNOSIS — D1803 Hemangioma of intra-abdominal structures: Secondary | ICD-10-CM | POA: Diagnosis not present

## 2015-07-28 DIAGNOSIS — T451X5A Adverse effect of antineoplastic and immunosuppressive drugs, initial encounter: Secondary | ICD-10-CM | POA: Diagnosis not present

## 2015-07-28 DIAGNOSIS — Z515 Encounter for palliative care: Secondary | ICD-10-CM | POA: Diagnosis present

## 2015-07-28 DIAGNOSIS — Z7189 Other specified counseling: Secondary | ICD-10-CM | POA: Insufficient documentation

## 2015-07-28 DIAGNOSIS — Z923 Personal history of irradiation: Secondary | ICD-10-CM

## 2015-07-28 DIAGNOSIS — E8770 Fluid overload, unspecified: Secondary | ICD-10-CM | POA: Diagnosis present

## 2015-07-28 DIAGNOSIS — N179 Acute kidney failure, unspecified: Secondary | ICD-10-CM | POA: Diagnosis not present

## 2015-07-28 DIAGNOSIS — R42 Dizziness and giddiness: Secondary | ICD-10-CM | POA: Diagnosis not present

## 2015-07-28 DIAGNOSIS — E860 Dehydration: Secondary | ICD-10-CM | POA: Diagnosis not present

## 2015-07-28 DIAGNOSIS — R55 Syncope and collapse: Secondary | ICD-10-CM | POA: Diagnosis present

## 2015-07-28 DIAGNOSIS — R1312 Dysphagia, oropharyngeal phase: Secondary | ICD-10-CM | POA: Diagnosis present

## 2015-07-28 DIAGNOSIS — Z9221 Personal history of antineoplastic chemotherapy: Secondary | ICD-10-CM

## 2015-07-28 DIAGNOSIS — H4089 Other specified glaucoma: Secondary | ICD-10-CM | POA: Diagnosis present

## 2015-07-28 DIAGNOSIS — R0902 Hypoxemia: Secondary | ICD-10-CM

## 2015-07-28 DIAGNOSIS — Z9181 History of falling: Secondary | ICD-10-CM

## 2015-07-28 DIAGNOSIS — E872 Acidosis: Secondary | ICD-10-CM | POA: Diagnosis not present

## 2015-07-28 DIAGNOSIS — I4891 Unspecified atrial fibrillation: Secondary | ICD-10-CM | POA: Diagnosis not present

## 2015-07-28 DIAGNOSIS — I959 Hypotension, unspecified: Secondary | ICD-10-CM | POA: Diagnosis present

## 2015-07-28 DIAGNOSIS — C541 Malignant neoplasm of endometrium: Secondary | ICD-10-CM | POA: Diagnosis not present

## 2015-07-28 DIAGNOSIS — R778 Other specified abnormalities of plasma proteins: Secondary | ICD-10-CM

## 2015-07-28 DIAGNOSIS — G620 Drug-induced polyneuropathy: Secondary | ICD-10-CM

## 2015-07-28 DIAGNOSIS — Z803 Family history of malignant neoplasm of breast: Secondary | ICD-10-CM

## 2015-07-28 DIAGNOSIS — R634 Abnormal weight loss: Secondary | ICD-10-CM | POA: Diagnosis present

## 2015-07-28 DIAGNOSIS — I272 Other secondary pulmonary hypertension: Secondary | ICD-10-CM | POA: Diagnosis not present

## 2015-07-28 DIAGNOSIS — R946 Abnormal results of thyroid function studies: Secondary | ICD-10-CM | POA: Diagnosis not present

## 2015-07-28 DIAGNOSIS — Z90710 Acquired absence of both cervix and uterus: Secondary | ICD-10-CM

## 2015-07-28 DIAGNOSIS — Z66 Do not resuscitate: Secondary | ICD-10-CM | POA: Diagnosis present

## 2015-07-28 DIAGNOSIS — D638 Anemia in other chronic diseases classified elsewhere: Secondary | ICD-10-CM | POA: Diagnosis not present

## 2015-07-28 DIAGNOSIS — G9340 Encephalopathy, unspecified: Secondary | ICD-10-CM | POA: Diagnosis not present

## 2015-07-28 DIAGNOSIS — R7989 Other specified abnormal findings of blood chemistry: Secondary | ICD-10-CM

## 2015-07-28 DIAGNOSIS — R531 Weakness: Secondary | ICD-10-CM | POA: Diagnosis not present

## 2015-07-28 DIAGNOSIS — T17998A Other foreign object in respiratory tract, part unspecified causing other injury, initial encounter: Secondary | ICD-10-CM | POA: Diagnosis not present

## 2015-07-28 DIAGNOSIS — R0600 Dyspnea, unspecified: Secondary | ICD-10-CM | POA: Insufficient documentation

## 2015-07-28 DIAGNOSIS — Z881 Allergy status to other antibiotic agents status: Secondary | ICD-10-CM

## 2015-07-28 DIAGNOSIS — Z79899 Other long term (current) drug therapy: Secondary | ICD-10-CM

## 2015-07-28 DIAGNOSIS — T17908A Unspecified foreign body in respiratory tract, part unspecified causing other injury, initial encounter: Secondary | ICD-10-CM

## 2015-07-28 DIAGNOSIS — I248 Other forms of acute ischemic heart disease: Secondary | ICD-10-CM | POA: Diagnosis present

## 2015-07-28 DIAGNOSIS — W1839XA Other fall on same level, initial encounter: Secondary | ICD-10-CM | POA: Diagnosis present

## 2015-07-28 DIAGNOSIS — F05 Delirium due to known physiological condition: Secondary | ICD-10-CM | POA: Diagnosis present

## 2015-07-28 LAB — I-STAT TROPONIN, ED: Troponin i, poc: 0.55 ng/mL (ref 0.00–0.08)

## 2015-07-28 LAB — CBC WITH DIFFERENTIAL/PLATELET
BASOS PCT: 0 %
Basophils Absolute: 0 10*3/uL (ref 0.0–0.1)
EOS PCT: 0 %
Eosinophils Absolute: 0 10*3/uL (ref 0.0–0.7)
HEMATOCRIT: 30.9 % — AB (ref 36.0–46.0)
HEMOGLOBIN: 9.8 g/dL — AB (ref 12.0–15.0)
LYMPHS PCT: 15 %
Lymphs Abs: 1.6 10*3/uL (ref 0.7–4.0)
MCH: 29.5 pg (ref 26.0–34.0)
MCHC: 31.7 g/dL (ref 30.0–36.0)
MCV: 93.1 fL (ref 78.0–100.0)
MONO ABS: 0.7 10*3/uL (ref 0.1–1.0)
MONOS PCT: 6 %
NEUTROS PCT: 79 %
Neutro Abs: 8.6 10*3/uL — ABNORMAL HIGH (ref 1.7–7.7)
Platelets: 291 10*3/uL (ref 150–400)
RBC: 3.32 MIL/uL — ABNORMAL LOW (ref 3.87–5.11)
RDW: 15.2 % (ref 11.5–15.5)
WBC: 10.9 10*3/uL — ABNORMAL HIGH (ref 4.0–10.5)

## 2015-07-28 LAB — URINALYSIS, ROUTINE W REFLEX MICROSCOPIC
Glucose, UA: NEGATIVE mg/dL
Ketones, ur: NEGATIVE mg/dL
LEUKOCYTES UA: NEGATIVE
NITRITE: NEGATIVE
PROTEIN: 30 mg/dL — AB
SPECIFIC GRAVITY, URINE: 1.028 (ref 1.005–1.030)
pH: 5 (ref 5.0–8.0)

## 2015-07-28 LAB — URINE MICROSCOPIC-ADD ON
RBC / HPF: NONE SEEN RBC/hpf (ref 0–5)
SQUAMOUS EPITHELIAL / LPF: NONE SEEN

## 2015-07-28 LAB — BASIC METABOLIC PANEL
Anion gap: 14 (ref 5–15)
BUN: 24 mg/dL — ABNORMAL HIGH (ref 6–20)
CHLORIDE: 105 mmol/L (ref 101–111)
CO2: 18 mmol/L — AB (ref 22–32)
CREATININE: 1.41 mg/dL — AB (ref 0.44–1.00)
Calcium: 8.4 mg/dL — ABNORMAL LOW (ref 8.9–10.3)
GFR calc non Af Amer: 34 mL/min — ABNORMAL LOW (ref >60)
GFR, EST AFRICAN AMERICAN: 39 mL/min — AB (ref >60)
Glucose, Bld: 204 mg/dL — ABNORMAL HIGH (ref 65–99)
POTASSIUM: 4.6 mmol/L (ref 3.5–5.1)
Sodium: 137 mmol/L (ref 135–145)

## 2015-07-28 LAB — TROPONIN I: Troponin I: 0.92 ng/mL (ref <0.031)

## 2015-07-28 MED ORDER — SODIUM CHLORIDE 0.9 % IV SOLN
INTRAVENOUS | Status: AC
  Administered 2015-07-28: 20:00:00 via INTRAVENOUS

## 2015-07-28 MED ORDER — METOCLOPRAMIDE HCL 5 MG/ML IJ SOLN
5.0000 mg | Freq: Once | INTRAMUSCULAR | Status: AC
  Administered 2015-07-28: 5 mg via INTRAVENOUS
  Filled 2015-07-28: qty 2

## 2015-07-28 MED ORDER — SODIUM CHLORIDE 0.9 % IV SOLN
Freq: Once | INTRAVENOUS | Status: AC
  Administered 2015-07-28: 125 mL/h via INTRAVENOUS

## 2015-07-28 MED ORDER — SODIUM CHLORIDE 0.9 % IV BOLUS (SEPSIS)
1000.0000 mL | Freq: Once | INTRAVENOUS | Status: AC
  Administered 2015-07-28: 1000 mL via INTRAVENOUS

## 2015-07-28 MED ORDER — ACETAMINOPHEN 500 MG PO TABS
500.0000 mg | ORAL_TABLET | Freq: Four times a day (QID) | ORAL | Status: DC | PRN
Start: 2015-07-28 — End: 2015-07-30
  Administered 2015-07-29: 500 mg via ORAL
  Filled 2015-07-28: qty 1

## 2015-07-28 MED ORDER — SODIUM CHLORIDE 0.9 % IV BOLUS (SEPSIS): 500.0000 mL | Freq: Once | INTRAVENOUS | Status: DC

## 2015-07-28 MED ORDER — POLYETHYLENE GLYCOL 3350 17 G PO PACK: 17.0000 g | PACK | Freq: Every day | ORAL | Status: DC | PRN

## 2015-07-28 MED ORDER — FERROUS FUMARATE 324 (106 FE) MG PO TABS
1.0000 | ORAL_TABLET | Freq: Every day | ORAL | Status: DC
  Administered 2015-07-28 – 2015-07-30 (×3): 106 mg via ORAL
  Filled 2015-07-28 (×4): qty 1

## 2015-07-28 MED ORDER — SODIUM CHLORIDE 0.9% FLUSH
10.0000 mL | INTRAVENOUS | Status: DC | PRN
  Administered 2015-07-31 – 2015-08-04 (×4): 10 mL
  Filled 2015-07-28 (×4): qty 40

## 2015-07-28 NOTE — ED Notes (Signed)
[place] called and stated patient has order for cardiac monitoring so unable to be admitted to that floor.

## 2015-07-28 NOTE — Progress Notes (Signed)
CTM reports pt is SR/AFIB with PR .11 -.12 rate is 80s' - 90's.  2nd troponi results called and 0.92 (first trop was 0.55).  bp 104/56, oxygen is 98 on room air.  Pt without complaints.  Have notified K. Schoor.  Will continue to monitor.

## 2015-07-28 NOTE — ED Notes (Signed)
Request for urinesample x1

## 2015-07-28 NOTE — ED Notes (Signed)
She states she "gently" fell this morning in her home.  She has no specific complaints at this time.  Her husband informs [place] pt. Has "not been eating much at all for about three weeks and she's getting real weak".  She has hx of cancer, which they state is in remission and she is no longer on chemo. Or any other treatments for this.

## 2015-07-28 NOTE — ED Notes (Signed)
Called CT to get ETA on when patient will be scanned since patient has ready bed.  Patient is next to for CT.

## 2015-07-28 NOTE — ED Provider Notes (Signed)
Complains of generalized weakness, for the past 4 weeks accompanied by diminished appetite. Denies abdominal pain denies chest pain denies headache. Last night at 11 PM she developed "dry heaves" no treatment prior to coming here. Nothing makes symptoms better or worse. On exam she is alert awake no distress HEENT exam mucous membranes dry neck supple lungs clear auscultation heart regular rate and rhythm abdomen nondistended nontender extremities without edema. ED ECG REPORT   Date: 07/28/2015  Rate: 90  Rhythm: normal sinus rhythm  QRS Axis: normal  Intervals: normal  ST/T Wave abnormalities: nonspecific T wave changes  Conduction Disutrbances:none  Narrative Interpretation:   Old EKG Reviewed: unchanged No significant change from 07/13/2013 I have personally reviewed the EKG tracing and disagree with the computerized printout as noted. No right axis deviation  Orlie Dakin, MD 07/28/15 1735

## 2015-07-28 NOTE — ED Notes (Signed)
RN and Dr. Winfred Leeds were notified of pt's Troponin of 0.55

## 2015-07-28 NOTE — ED Notes (Signed)
Called Floor to give report, RN unavailable at this time, awaiting a call back.

## 2015-07-28 NOTE — ED Provider Notes (Signed)
CSN: [id number]     Arrival date & time 07/28/15  1048 History   First MD Initiated Contact with Patient 07/28/15 1108     Chief Complaint  Patient presents with  . Fall  . Weakness   HPI   Ebony Herring is an 82 y.o. female with history of endometrial cancer (now in remission) who presents to the ED for evaluation of generalized weakness. She states she has had poor appetite for the past few weeks with poor PO intake. She states that last night she started feeling dizzy and lightheaded like she was going to pass out. She states last night she had three episodes of NBNB emesis. States she has had no further emesis but has continued to feel intermittently nauseated with dry heaving. She states that this morning she was in the restroom when she started feeling lightheaded and fell, sliding down the wall. She states she does not think she passed out completely. Denies hitting her head or LOC. She lives at home with her husband who agrees that for the past three weeks pt has not been eating/drinking much and has been spending most of her time in bed or on the couch watching TV. Her son states she has not had any overt confusion but has been "off" for "a while." In the ED now pt states she feels weak and tired. She feels lightheaded when she tries to standup. Denies chest pain or SOB. Denies headache. Denies any pain. She is doing well from a cancer standpoint, now in remission with no radiation or chemo for over a year.  Past Medical History  Diagnosis Date  . Glaucoma     High pressure- ?pre glaucoma  . Cancer [facility])     ENDOMETRIAL CANCER  . Radiation 06/01/13-07/07/13    45 gray to pelvis and periaortic region  . History of brachytherapy 07/13/13, 07/20/13, 07/27/13    vaginal cuff boost 18 gray   Past Surgical History  Procedure Laterality Date  . Cholecystectomy    . Appendectomy    . Abdominal hysterectomy N/A 03/01/2013    Procedure: HYSTERECTOMY ABDOMINAL TOTAL/BILATERAL SALPINGO  OOPHORECTOMY/WITH PELVIC AND PARA AORTIC LYMPHADNECTOMY ;  Surgeon: Alvino Chapel, MD;  Location: [facility];  Service: Gynecology;  Laterality: N/A;  . Salpingoophorectomy Bilateral 03/01/2013    Procedure: BILATERAL SALPINGO OOPHORECTOMY;  Surgeon: Alvino Chapel, MD;  Location: [facility];  Service: Gynecology;  Laterality: Bilateral;   Family History  Problem Relation Age of Onset  . Breast cancer Maternal Aunt     x 4   Social History  Substance Use Topics  . Smoking status: Never Smoker   . Smokeless tobacco: Never Used  . Alcohol Use: No   OB History    No data available     Review of Systems  All other systems reviewed and are negative.     Allergies  Bactrim and Ciprofloxacin  Home Medications   Prior to Admission medications   Medication Sig Start Date End Date Taking? Authorizing Provider  Brinzolamide-Brimonidine [facility]) 1-0.2 % SUSP Place 1 drop into both eyes 2 (two) times daily.     Historical Provider, MD  FERROCITE 324 MG TABS Take 1 tablet by mouth daily. 01/16/15   Historical Provider, MD  gabapentin (NEURONTIN) 100 MG capsule Take one tablet every am in addition to the 300 mg tablet at bedtime. Patient not taking: Reported on 07/04/2015 06/15/15   Gordy Levan, MD  gabapentin (NEURONTIN) 300 MG capsule Take 1 capsule (  300 mg total) by mouth at bedtime. For neuropathy. Patient not taking: Reported on 07/04/2015 04/03/15   Lennis P Livesay, MD   BP 83/61 mmHg  Pulse 93  Temp(Src) 97.9 F (36.6 C) (Oral)  Resp 16 Physical Exam  Constitutional: She is oriented to person, place, and time.  Elderly, frail appearing  HENT:  Right Ear: External ear normal.  Left Ear: External ear normal.  Nose: Nose normal.  Mouth/Throat: Oropharynx is clear and moist. No oropharyngeal exudate.  Eyes: Conjunctivae and EOM are normal. Pupils are equal, round, and reactive to light.  Neck: Normal range of motion. Neck supple.  Cardiovascular: Normal rate,  regular rhythm, normal heart sounds and intact distal pulses.   Pulmonary/Chest: Effort normal and breath sounds normal. No respiratory distress. She has no wheezes. She exhibits no tenderness.  Abdominal: Soft. Bowel sounds are normal. She exhibits no distension. There is no tenderness. There is no rebound and no guarding.  Musculoskeletal: She exhibits no edema.  Neurological: She is alert and oriented to person, place, and time. No cranial nerve deficit.  No pronator drift Normal finger to nose 5/5 strength throughout Pt reports feeling dizzy and weak when standing  Skin: Skin is warm and dry. There is pallor.  Psychiatric: She has a normal mood and affect.  Nursing note and vitals reviewed.  Orthostatic VS for the past 24 hrs:  BP- Lying Pulse- Lying BP- Sitting Pulse- Sitting BP- Standing at 0 minutes Pulse- Standing at 0 minutes  07/28/15 1212 (!) 88/62 mmHg 85 98/52 mmHg 94 (!) 89/58 mmHg 102       ED Course  Procedures (including critical care time) Labs Review Labs Reviewed  BASIC METABOLIC PANEL - Abnormal; Notable for the following:    CO2 18 (*)    Glucose, Bld 204 (*)    BUN 24 (*)    Creatinine, Ser 1.41 (*)    Calcium 8.4 (*)    GFR calc non Af Amer 34 (*)    GFR calc Af Amer 39 (*)    All other components within normal limits  CBC WITH DIFFERENTIAL/PLATELET - Abnormal; Notable for the following:    WBC 10.9 (*)    RBC 3.32 (*)    Hemoglobin 9.8 (*)    HCT 30.9 (*)    Neutro Abs 8.6 (*)    All other components within normal limits  URINALYSIS, ROUTINE W REFLEX MICROSCOPIC (NOT AT [facility]) - Abnormal; Notable for the following:    Color, Urine ORANGE (*)    APPearance TURBID (*)    Hgb urine dipstick SMALL (*)    Bilirubin Urine MODERATE (*)    Protein, ur 30 (*)    All other components within normal limits  URINE MICROSCOPIC-ADD ON - Abnormal; Notable for the following:    Bacteria, UA MANY (*)    Casts HYALINE CASTS (*)    All other components within  normal limits  I-STAT TROPOININ, ED - Abnormal; Notable for the following:    Troponin i, poc 0.55 (*)    All other components within normal limits  URINE CULTURE    Imaging Review Dg Chest 2 View  07/28/2015  CLINICAL DATA:  Ms.  Lonell Grandchild. EXAM: CHEST  2 VIEW COMPARISON:  Chest CT 11/14/2013; chest radiograph 02/25/2013. FINDINGS: Right anterior chest wall Port-A-Cath with tip projecting over the superior vena cava. Large hiatal hernia. Stable cardiac and mediastinal contours. No consolidative pulmonary opacities. No pleural effusion or pneumothorax. Cholecystectomy clips. Osseous skeleton is unremarkable. IMPRESSION:  No active cardiopulmonary disease. Electronically Signed   By: Lovey Newcomer M.D.   On: 07/28/2015 11:52   I have personally reviewed and evaluated these images and lab results as part of my medical decision-making.  ED ECG REPORT  Date: 07/28/2015  Rate: 90  Rhythm: normal sinus rhythm  QRS Axis: normal  Intervals: normal  ST/T Wave abnormalities: nonspecific T wave changes  Conduction Disutrbances:none  Narrative Interpretation:  Old EKG Reviewed: unchanged  No significant change from 07/13/2013   MDM   Final diagnoses:  Dehydration  Generalized weakness  Elevated troponin  Dizziness    Pt is an 82 y.o. female presenting with several weeks of generalized weakness and poor PO intake. Has had some intermittent dizziness and lightheadedness since yesterday, several episodes of emesis last night, with a pre-syncopal episode this morning. Her neuro exam is intact but she feels weak and lightheaded when she is standing. Her troponin today is 0.55, likely demand ischemia. EKG nonacute and unchanged from prior and she has no chest pain and SOB. CXR negative. Labs with a modest white count, some renal insufficiency with Cr 1.41.  UA with no WBC, negative nitrite, 30 protein, and many bacteria. Given weakness and dehydration with elevated troponin will call for  admission. Urine culture ordered. Hold off on UTI abx at this time. Fluids running in the ED with improvement in her BP.   I spoke with Dr. Hollice Gong of the hospitalist service who will admit pt to obs. Appreciate assistance.      Anne Ng, PA-C 07/28/15 [place], MD 07/28/15 [phone]

## 2015-07-28 NOTE — H&P (Signed)
History and Physical  PRICIE MCLERRAN [id number] DOB: Aug 15, 1931 DOA: 07/28/2015   PCP: [name], MD   Patient coming from: Home with family.   Chief Complaint: Weakness, presyncope   HPI: Ebony Herring is a 82 y.o. female with medical history significant for Endometrial cancer status post resection January 2015 and chemoradiation ending August 2015 who is being admitted for dehydration, dizziness and presyncope. She has been in her usual state of health per family at the bedside in ER until about 3 weeks ago when she had a sudden drop in her appetite. She had no nausea until last night when she had emesis x2 as well as some diarrhea. She denies any fevers, chills, pain, syncope, falls. She has lost about 10 pounds in the last 3-4 months. She just had a check up with a normal pelvic exam and Pap smear earlier this month.  ED Course: She was found in the ER to be relatively hypotensive, with mild leukocytosis, AKI and troponin leak. She was dizzy after IVF administration and given her age and lab abnormalities hospilatist was asked to admit for observation.  Review of Systems: Please see HPI for pertinent positives and negatives. A complete 10 system review of systems are otherwise negative.  Past Medical History  Diagnosis Date  . Glaucoma     High pressure- ?pre glaucoma  . Cancer [facility])     ENDOMETRIAL CANCER  . Radiation 06/01/13-07/07/13    45 gray to pelvis and periaortic region  . History of brachytherapy 07/13/13, 07/20/13, 07/27/13    vaginal cuff boost 18 gray   Past Surgical History  Procedure Laterality Date  . Cholecystectomy    . Appendectomy    . Abdominal hysterectomy N/A 03/01/2013    Procedure: HYSTERECTOMY ABDOMINAL TOTAL/BILATERAL SALPINGO OOPHORECTOMY/WITH PELVIC AND PARA AORTIC LYMPHADNECTOMY ;  Surgeon: Alvino Chapel, MD;  Location: [facility];  Service: Gynecology;  Laterality: N/A;  . Salpingoophorectomy Bilateral 03/01/2013    Procedure: BILATERAL  SALPINGO OOPHORECTOMY;  Surgeon: Alvino Chapel, MD;  Location: [facility];  Service: Gynecology;  Laterality: Bilateral;    Social History:  reports that she has never smoked. She has never used smokeless tobacco. She reports that she does not drink alcohol or use illicit drugs.   Allergies  Allergen Reactions  . Bactrim [Sulfamethoxazole-Trimethoprim] Other (See Comments)    Made her see things  . Ciprofloxacin Other (See Comments)    Made her see things    Family History  Problem Relation Age of Onset  . Breast cancer Maternal Aunt     x 4     Prior to Admission medications   Medication Sig Start Date End Date Taking? Authorizing Provider  acetaminophen (TYLENOL) 500 MG tablet Take 500 mg by mouth every 6 (six) hours as needed for mild pain.   Yes Historical Provider, MD  Brinzolamide-Brimonidine [facility]) 1-0.2 % SUSP Place 1 drop into both eyes 2 (two) times daily.    Yes Historical Provider, MD  FERROCITE 324 MG TABS Take 1 tablet by mouth daily. 01/16/15  Yes Historical Provider, MD  gabapentin (NEURONTIN) 100 MG capsule Take one tablet every am in addition to the 300 mg tablet at bedtime. Patient not taking: Reported on 07/04/2015 06/15/15   Gordy Levan, MD  gabapentin (NEURONTIN) 300 MG capsule Take 1 capsule (300 mg total) by mouth at bedtime. For neuropathy. Patient not taking: Reported on 07/04/2015 04/03/15   Gordy Levan, MD    Physical Exam: BP 97/64 mmHg  Pulse 74  Temp(Src) 98.7 F (37.1 C) (Oral)  Resp 26  SpO2 94%  General:  Alert, oriented, calm, in no acute distress  Eyes: pupils round and reactive to light and accomodation, clear sclerea Neck: supple, no masses, trachea mildline  Cardiovascular: RRR, no murmurs or rubs, no peripheral edema  Respiratory: clear to auscultation bilaterally, no wheezes, no crackles  Abdomen: soft, nontender, nondistended, normal bowel tones heard  Skin: dry, no rashes  Musculoskeletal: no joint effusions,  normal range of motion  Psychiatric: appropriate affect, normal speech  Neurologic: extraocular muscles intact, clear speech, moving all extremities with intact sensorium            Labs on Admission:  Basic Metabolic Panel:  Recent Labs Lab 07/28/15 1154  NA 137  K 4.6  CL 105  CO2 18*  GLUCOSE 204*  BUN 24*  CREATININE 1.41*  CALCIUM 8.4*   Liver Function Tests: No results for input(s): AST, ALT, ALKPHOS, BILITOT, PROT, ALBUMIN in the last 168 hours. No results for input(s): LIPASE, AMYLASE in the last 168 hours. No results for input(s): AMMONIA in the last 168 hours. CBC:  Recent Labs Lab 07/28/15 1154  WBC 10.9*  NEUTROABS 8.6*  HGB 9.8*  HCT 30.9*  MCV 93.1  PLT 291   Cardiac Enzymes: No results for input(s): CKTOTAL, CKMB, CKMBINDEX, TROPONINI in the last 168 hours.  BNP (last 3 results) No results for input(s): BNP in the last 8760 hours.  ProBNP (last 3 results) No results for input(s): PROBNP in the last 8760 hours.  CBG: No results for input(s): GLUCAP in the last 168 hours.  Radiological Exams on Admission: Dg Chest 2 View  07/28/2015  CLINICAL DATA:  [name]. EXAM: CHEST  2 VIEW COMPARISON:  Chest CT 11/14/2013; chest radiograph 02/25/2013. FINDINGS: Right anterior chest wall Port-A-Cath with tip projecting over the superior vena cava. Large hiatal hernia. Stable cardiac and mediastinal contours. No consolidative pulmonary opacities. No pleural effusion or pneumothorax. Cholecystectomy clips. Osseous skeleton is unremarkable. IMPRESSION: No active cardiopulmonary disease. Electronically Signed   By: Lovey Newcomer M.D.   On: 07/28/2015 11:52   Assessment/Plan Present on Admission:  . Dehydration . AKI (acute kidney injury) ([place])  Weakness, dizziness, nausea  - this could be from a posterior circulation stroke, will obtain Noncontrast head CT now; but more likely just a result of dehydration from low PO intake. Nothing to suggest  recurrence of her malignancy. - hydrate - PT consult  Peripheral neuropathy due to chemotherapy (HCC)  Dehydration - unclear etiology at this point, her urine has some bacteria but no nitrites, she denies dysuria or increased frequency. Will hold off on antibiotics for now.  AKI (acute kidney injury) ([place]) - hydrate overnight, recheck Cr in AM. If not improving, would check renal US to rule out obstruction.  DVT prophylaxis: SCDs   Code Status: FULL   Family Communication: Son and husband at bedside. They understand and agree with the plan of care.   Disposition Plan: Likely home with family in AM.   Consults called: None   Admission status: Obs Tele bed   Time spent: 46 minutes  Vannary Greening Marry Guan MD [facility] Pager [phone]  If 7PM-7AM, please contact night-coverage [email or web address] Password [facility]  07/28/2015, 5:24 PM

## 2015-07-29 ENCOUNTER — Observation Stay (HOSPITAL_COMMUNITY): Payer: Medicare Other

## 2015-07-29 DIAGNOSIS — R7989 Other specified abnormal findings of blood chemistry: Secondary | ICD-10-CM | POA: Diagnosis not present

## 2015-07-29 DIAGNOSIS — R42 Dizziness and giddiness: Secondary | ICD-10-CM

## 2015-07-29 DIAGNOSIS — R946 Abnormal results of thyroid function studies: Secondary | ICD-10-CM

## 2015-07-29 DIAGNOSIS — N179 Acute kidney failure, unspecified: Secondary | ICD-10-CM | POA: Diagnosis not present

## 2015-07-29 DIAGNOSIS — R778 Other specified abnormalities of plasma proteins: Secondary | ICD-10-CM

## 2015-07-29 DIAGNOSIS — E860 Dehydration: Secondary | ICD-10-CM

## 2015-07-29 LAB — CBC
HCT: 27.3 % — ABNORMAL LOW (ref 36.0–46.0)
Hemoglobin: 8.6 g/dL — ABNORMAL LOW (ref 12.0–15.0)
MCH: 29.4 pg (ref 26.0–34.0)
MCHC: 31.5 g/dL (ref 30.0–36.0)
MCV: 93.2 fL (ref 78.0–100.0)
PLATELETS: 225 10*3/uL (ref 150–400)
RBC: 2.93 MIL/uL — ABNORMAL LOW (ref 3.87–5.11)
RDW: 15.3 % (ref 11.5–15.5)
WBC: 9.3 10*3/uL (ref 4.0–10.5)

## 2015-07-29 LAB — BASIC METABOLIC PANEL
ANION GAP: 10 (ref 5–15)
BUN: 26 mg/dL — ABNORMAL HIGH (ref 6–20)
CALCIUM: 7.8 mg/dL — AB (ref 8.9–10.3)
CO2: 19 mmol/L — AB (ref 22–32)
CREATININE: 1.3 mg/dL — AB (ref 0.44–1.00)
Chloride: 109 mmol/L (ref 101–111)
GFR, EST AFRICAN AMERICAN: 43 mL/min — AB (ref >60)
GFR, EST NON AFRICAN AMERICAN: 37 mL/min — AB (ref >60)
Glucose, Bld: 118 mg/dL — ABNORMAL HIGH (ref 65–99)
Potassium: 4.4 mmol/L (ref 3.5–5.1)
SODIUM: 138 mmol/L (ref 135–145)

## 2015-07-29 LAB — MAGNESIUM: MAGNESIUM: 1.3 mg/dL — AB (ref 1.7–2.4)

## 2015-07-29 LAB — TSH: TSH: 9.655 u[IU]/mL — AB (ref 0.350–4.500)

## 2015-07-29 LAB — TROPONIN I
Troponin I: 1.06 ng/mL (ref <0.031)
Troponin I: 0.93 ng/mL (ref <0.031)

## 2015-07-29 MED ORDER — METOPROLOL TARTRATE 25 MG PO TABS
12.5000 mg | ORAL_TABLET | Freq: Two times a day (BID) | ORAL | Status: DC
  Administered 2015-07-29 – 2015-07-30 (×3): 12.5 mg via ORAL
  Filled 2015-07-29 (×3): qty 1

## 2015-07-29 MED ORDER — MAGNESIUM SULFATE 2 GM/50ML IV SOLN
2.0000 g | Freq: Once | INTRAVENOUS | Status: AC
  Administered 2015-07-29: 2 g via INTRAVENOUS
  Filled 2015-07-29: qty 50

## 2015-07-29 MED ORDER — LEVOTHYROXINE SODIUM 50 MCG PO TABS
50.0000 ug | ORAL_TABLET | Freq: Every day | ORAL | Status: DC
  Administered 2015-07-30: 50 ug via ORAL
  Filled 2015-07-29: qty 1

## 2015-07-29 MED ORDER — ASPIRIN EC 81 MG PO TBEC
81.0000 mg | DELAYED_RELEASE_TABLET | Freq: Every day | ORAL | Status: DC
  Administered 2015-07-29 – 2015-07-30 (×2): 81 mg via ORAL
  Filled 2015-07-29 (×2): qty 1

## 2015-07-29 NOTE — Progress Notes (Signed)
EKG showed SR, and CTM states pt has been SR since right after admission.  VSS pt remains w/o complaints.

## 2015-07-29 NOTE — Progress Notes (Addendum)
PROGRESS NOTE  Ebony Herring Ebony Herring DOB: September 15, 1931 DOA: 07/28/2015 PCP: Ebony Blackbird, MD  HPI/Recap of past 24 hours: Ebony Herring is a 82 y.o. female with medical history significant for Endometrial cancer status post resection January 2015 and chemoradiation ending August 2015 who is being admitted for dehydration, dizziness and presyncope. Overnight she has done well, feels better this AM but she had elevation of her troponin to 1.0 overnight, it is now trending back down. She denies chest pain, palpitation, weakness.   Assessment/Plan: Troponin elevation - without EKG change, will start on ASA, beta blocker, check TSH (due to temporary tachy/Afib overnight), check TTE and consult cardiology for any further workup recommendations.   Peripheral neuropathy due to chemotherapy (HCC)   Dehydration   AKI (acute kidney injury) ([place]) - marginal improvement, will cont IVF and check Renal US today to rule out obstructive pathology.  PM ADDENDUM: Mg is low, will replete. TSH is 9, with symptoms will start low dose Synthroid.   Code Status: FULL   Family Communication: Discussed with son and husband at the bedside this AM.   Disposition Plan: Home in 1-2 days.   Consultants:  Cardiology consult pending.   Procedures:  None   Antimicrobials:  None    Objective: Filed Vitals:   07/28/15 2331 07/29/15 0400 07/29/15 0550 07/29/15 1055  BP: 112/68 108/50 123/64 117/65  Pulse: 80 80 89 90  Temp: 99.1 F (37.3 C) 98.3 F (36.8 C) 98.3 F (36.8 C)   TempSrc: Oral Oral Oral   Resp: 20 18 20    Height:      Weight:      SpO2: 94% 97% 93%     Intake/Output Summary (Last 24 hours) at 07/29/15 1226 Last data filed at 07/29/15 0943  Gross per 24 hour  Intake   1025 ml  Output    550 ml  Net    475 ml   Filed Weights   07/28/15 1959  Weight: 56.654 kg (124 lb 14.4 oz)    Exam: General:  Alert, oriented, calm, in no acute distress Eyes: pupils round and  reactive to light and accomodation, clear sclerea Neck: supple, no masses, trachea mildline  Cardiovascular: RRR, no murmurs or rubs, no peripheral edema  Respiratory: clear to auscultation bilaterally, no wheezes, no crackles  Abdomen: soft, nontender, nondistended, normal bowel tones heard  Skin: dry, no rashes  Musculoskeletal: no joint effusions, normal range of motion  Psychiatric: appropriate affect, normal speech  Neurologic: extraocular muscles intact, clear speech, moving all extremities with intact sensorium    Data Reviewed: CBC:  Recent Labs Lab 07/28/15 1154 07/29/15 0300  WBC 10.9* 9.3  NEUTROABS 8.6*  --   HGB 9.8* 8.6*  HCT 30.9* 27.3*  MCV 93.1 93.2  PLT 291 [id number]   Basic Metabolic Panel:  Recent Labs Lab 07/28/15 1154 07/29/15 0300 07/29/15 1105  NA 137 138  --   K 4.6 4.4  --   CL 105 109  --   CO2 18* 19*  --   GLUCOSE 204* 118*  --   BUN 24* 26*  --   CREATININE 1.41* 1.30*  --   CALCIUM 8.4* 7.8*  --   MG  --   --  1.3*   GFR: Estimated Creatinine Clearance: 29.3 mL/min (by C-G formula based on Cr of 1.3). Liver Function Tests: No results for input(s): AST, ALT, ALKPHOS, BILITOT, PROT, ALBUMIN in the last 168 hours. No results for input(s): LIPASE, AMYLASE in  the last 168 hours. No results for input(s): AMMONIA in the last 168 hours. Coagulation Profile: No results for input(s): INR, PROTIME in the last 168 hours. Cardiac Enzymes:  Recent Labs Lab 07/28/15 2050 07/29/15 0300 07/29/15 0825  TROPONINI 0.92* 1.06* 0.93*   BNP (last 3 results) No results for input(s): PROBNP in the last 8760 hours. HbA1C: No results for input(s): HGBA1C in the last 72 hours. CBG: No results for input(s): GLUCAP in the last 168 hours. Lipid Profile: No results for input(s): CHOL, HDL, LDLCALC, TRIG, CHOLHDL, LDLDIRECT in the last 72 hours. Thyroid Function Tests:  Recent Labs  07/29/15 1105  TSH 9.655*   Anemia Panel: No results for input(s):  VITAMINB12, FOLATE, FERRITIN, TIBC, IRON, RETICCTPCT in the last 72 hours. Urine analysis:    Component Value Date/Time   COLORURINE ORANGE* 07/28/2015 1315   APPEARANCEUR TURBID* 07/28/2015 1315   LABSPEC 1.028 07/28/2015 1315   LABSPEC 1.010 05/05/2013 1015   PHURINE 5.0 07/28/2015 1315   PHURINE 7.0 05/05/2013 1015   GLUCOSEU NEGATIVE 07/28/2015 1315   GLUCOSEU Negative 05/05/2013 1015   HGBUR SMALL* 07/28/2015 1315   HGBUR Negative 05/05/2013 1015   BILIRUBINUR MODERATE* 07/28/2015 1315   BILIRUBINUR Negative 05/05/2013 1015   KETONESUR NEGATIVE 07/28/2015 1315   KETONESUR Negative 05/05/2013 1015   PROTEINUR 30* 07/28/2015 1315   PROTEINUR < 30 05/05/2013 1015   UROBILINOGEN 0.2 07/14/2013 0515   UROBILINOGEN 0.2 05/05/2013 1015   NITRITE NEGATIVE 07/28/2015 1315   NITRITE Negative 05/05/2013 1015   LEUKOCYTESUR NEGATIVE 07/28/2015 1315   LEUKOCYTESUR Small 05/05/2013 1015   Sepsis Labs: @LABRCNTIP (procalcitonin:4,lacticidven:4)  )No results found for this or any previous visit (from the past 240 hour(s)).    Studies: Ct Head Wo Contrast  07/28/2015  CLINICAL DATA:  Hx of endometrial ca and is currently having dizziness that brought her to the ED EXAM: CT HEAD WITHOUT CONTRAST TECHNIQUE: Contiguous axial images were obtained from the base of the skull through the vertex without intravenous contrast. COMPARISON:  None. FINDINGS: The ventricles are normal in configuration. There is ventricular and sulcal enlargement reflecting mild atrophy. No hydrocephalus. There are no parenchymal masses or mass effect. There is no evidence of an infarct. Mild periventricular white matter hypoattenuation is noted consistent with chronic microvascular ischemic change. There are no extra-axial masses or abnormal fluid collections. There is no intracranial hemorrhage. There is dependent fluid in the left maxillary sinus. There is a posterior mucous retention cysts left sphenoid sinus. Remaining  visualized sinuses are clear as are the mastoid air cells. No skull fracture. IMPRESSION: 1. No acute intracranial abnormalities.  No skull fracture. 2. Air-fluid level in the left maxillary sinus. This is nonspecific. Consider acute sinusitis in the proper clinical setting. Electronically Signed   By: Lajean Manes M.D.   On: 07/28/2015 17:52    Scheduled Meds: . aspirin EC  81 mg Oral Daily  . Ferrous Fumarate  1 tablet Oral Daily  . metoprolol tartrate  12.5 mg Oral BID   Continuous Infusions:      Time spent: 23 minutes  Gentle Hoge Marry Guan, MD [facility] Pager [phone]  If 7PM-7AM, please contact night-coverage [email or web address] Password [facility] 07/29/2015, 12:26 PM

## 2015-07-29 NOTE — Consult Note (Signed)
CARDIOLOGY CONSULT NOTE       Patient ID: Ebony Herring MRN: [id number] DOB/AGE: 11-Jan-1932 82 y.o.  Admit date: 07/28/2015 Referring Physician:  Hollice Gong Primary Physician: [name], MD Primary Cardiologist:  New Reason for Consultation: Pre syncope  Active Problems:   Peripheral neuropathy due to chemotherapy [facility])   Dehydration   AKI (acute kidney injury) ([place])   HPI:  82 y.o. no previous cardiac history . History of endometrial CA with chemo and XRT 2 years ago. For last few weeks has had fatigue, malaise, nausea and poor appetite She has lost some weight with poor PO intake No chest pain, Mild dyspnea. No history of CAD  Troponins found to be mildly elevated with no trend. Not clear why they were checked CXR with NAD and no CE.  He TSH is mildly elevated at 9 range. Also complains of loose stools and diarrhea No recent antibiotic RX  Last abdominal CT with ? Rectal lesion in 2015 Not clear what w/u done. Telemetry review shows NSR with no aV block and no bradycardia.    ROS All other systems reviewed and negative except as noted above  Past Medical History  Diagnosis Date  . Glaucoma     High pressure- ?pre glaucoma  . Cancer [facility])     ENDOMETRIAL CANCER  . Radiation 06/01/13-07/07/13    47 gray to pelvis and periaortic region  . History of brachytherapy 07/13/13, 07/20/13, 07/27/13    vaginal cuff boost 20 gray    Family History  Problem Relation Age of Onset  . Breast cancer Maternal Aunt     x 4    Social History   Social History  . Marital Status: Married    Spouse Name: N/A  . Number of Children: 1  . Years of Education: N/A   Occupational History  . Not on file.   Social History Main Topics  . Smoking status: Never Smoker   . Smokeless tobacco: Never Used  . Alcohol Use: No  . Drug Use: No  . Sexual Activity: No   Other Topics Concern  . Not on file   Social History Narrative    Past Surgical History  Procedure Laterality Date  .  Cholecystectomy    . Appendectomy    . Abdominal hysterectomy N/A 03/01/2013    Procedure: HYSTERECTOMY ABDOMINAL TOTAL/BILATERAL SALPINGO OOPHORECTOMY/WITH PELVIC AND PARA AORTIC LYMPHADNECTOMY ;  Surgeon: Alvino Chapel, MD;  Location: [facility];  Service: Gynecology;  Laterality: N/A;  . Salpingoophorectomy Bilateral 03/01/2013    Procedure: BILATERAL SALPINGO OOPHORECTOMY;  Surgeon: Alvino Chapel, MD;  Location: [facility];  Service: Gynecology;  Laterality: Bilateral;     . aspirin EC  81 mg Oral Daily  . Ferrous Fumarate  1 tablet Oral Daily  . metoprolol tartrate  12.5 mg Oral BID      Physical Exam: Blood pressure 117/65, pulse 90, temperature 98.3 F (36.8 C), temperature source Oral, resp. rate 20, height 5\' 4"  (1.626 m), weight 124 lb 14.4 oz (56.654 kg), SpO2 93 %.    Affect appropriate Frail thin elderly female  HEENT: normal Neck supple with no adenopathy JVP normal no bruits no thyromegaly Lungs clear with no wheezing and good diaphragmatic motion Heart:  S1/S2 no murmur, no rub, gallop or click PMI normal Abdomen: benighn, BS positve, no tenderness, no AAA no bruit.  No HSM or HJR Distal pulses intact with no bruits No edema Neuro non-focal Skin warm and dry No muscular weakness  Labs:   Lab Results  Component Value Date   WBC 9.3 07/29/2015   HGB 8.6* 07/29/2015   HCT 27.3* 07/29/2015   MCV 93.2 07/29/2015   PLT 225 07/29/2015    Recent Labs Lab 07/29/15 0300  NA 138  K 4.4  CL 109  CO2 19*  BUN 26*  CREATININE 1.30*  CALCIUM 7.8*  GLUCOSE 118*   Lab Results  Component Value Date   TROPONINI 0.93* 07/29/2015    Lab Results  Component Value Date   CHOL 239* 12/18/2014   CHOL 274* 10/27/2012   Lab Results  Component Value Date   HDL 40* 12/18/2014   HDL 57 10/27/2012   Lab Results  Component Value Date   LDLCALC 143* 12/18/2014   LDLCALC 167* 10/27/2012   Lab Results  Component Value Date   TRIG 282* 12/18/2014    TRIG 252* 10/27/2012   Lab Results  Component Value Date   CHOLHDL 6.0* 12/18/2014   CHOLHDL 4.8 10/27/2012   No results found for: LDLDIRECT    Radiology: Dg Chest 2 View  07/28/2015  CLINICAL DATA:  [name]. EXAM: CHEST  2 VIEW COMPARISON:  Chest CT 11/14/2013; chest radiograph 02/25/2013. FINDINGS: Right anterior chest wall Port-A-Cath with tip projecting over the superior vena cava. Large hiatal hernia. Stable cardiac and mediastinal contours. No consolidative pulmonary opacities. No pleural effusion or pneumothorax. Cholecystectomy clips. Osseous skeleton is unremarkable. IMPRESSION: No active cardiopulmonary disease. Electronically Signed   By: Lovey Newcomer M.D.   On: 07/28/2015 11:52   Ct Head Wo Contrast  07/28/2015  CLINICAL DATA:  Hx of endometrial ca and is currently having dizziness that brought her to the ED EXAM: CT HEAD WITHOUT CONTRAST TECHNIQUE: Contiguous axial images were obtained from the base of the skull through the vertex without intravenous contrast. COMPARISON:  None. FINDINGS: The ventricles are normal in configuration. There is ventricular and sulcal enlargement reflecting mild atrophy. No hydrocephalus. There are no parenchymal masses or mass effect. There is no evidence of an infarct. Mild periventricular white matter hypoattenuation is noted consistent with chronic microvascular ischemic change. There are no extra-axial masses or abnormal fluid collections. There is no intracranial hemorrhage. There is dependent fluid in the left maxillary sinus. There is a posterior mucous retention cysts left sphenoid sinus. Remaining visualized sinuses are clear as are the mastoid air cells. No skull fracture. IMPRESSION: 1. No acute intracranial abnormalities.  No skull fracture. 2. Air-fluid level in the left maxillary sinus. This is nonspecific. Consider acute sinusitis in the proper clinical setting. Electronically Signed   By: Lajean Manes M.D.   On: 07/28/2015 17:52      EKG:  SR low voltage non specific ST changes   ASSESSMENT AND PLAN:  Elevated Troponin:  No chest pain no acute ECG changes no evolution in numbers. No evidence of acute coronary syndrome Echo pending Fatigue:  Chronic anemia with mildly elevated TSH. Consider adding low dose synthroid. Echo for EF.   Cancer:  Concern for constitutional symptoms with previous endometrial cancer and symptoms focusing on poor appetite nausea Consider F/u abdominal CT with contrast.   Dizziness:  Postural with elevated BUN/Cr improving with hydration not related to arrhythmia on telemetry   Signed: Jenkins Rouge 07/29/2015, 1:56 PM

## 2015-07-29 NOTE — Evaluation (Signed)
Physical Therapy Evaluation Patient Details Name: Ebony Herring MRN: [id number] DOB: 06-Feb-1932 Today's Date: 07/29/2015   History of Present Illness  82 y.o. female with medical history significant for endometrial cancer status post resection January 2015 and chemoradiation ending August 2015 and admitted 07/28/15 for dehydration, dizziness and presyncope  Clinical Impression  Pt admitted with above diagnosis. Pt currently with functional limitations due to the deficits listed below (see PT Problem List).  Pt will benefit from skilled PT to increase their independence and safety with mobility to allow discharge to the venue listed below.  Pt reports fatigue and weakness today.  Pt with limited mobility due to dizziness and BP 93/57 mmHg upon returning to supine.  Attempted to discuss RW for safety at home however pt politely mentions she would not use a RW even if provided one.     Follow Up Recommendations Home health PT;Supervision for mobility/OOB    Equipment Recommendations  Rolling walker with 5" wheels    Recommendations for Other Services       Precautions / Restrictions Precautions Precautions: Fall Precaution Comments: monitor BP      Mobility  Bed Mobility Overal bed mobility: Needs Assistance Bed Mobility: Supine to Sit;Sit to Supine     Supine to sit: Supervision Sit to supine: Min guard   General bed mobility comments: supervision for lines upon sitting and min/guard for safety due to pt rushing to return to bed  Transfers Overall transfer level: Needs assistance Equipment used: None Transfers: Sit to/from Stand Sit to Stand: Min assist         General transfer comment: assist for steadying  Ambulation/Gait Ambulation/Gait assistance: Min assist Ambulation Distance (Feet): 80 Feet Assistive device: None Gait Pattern/deviations: Step-through pattern;Decreased stride length     General Gait Details: pt pushed IV pole, assist for occasional steadying,  pt required seated rest break after 40 feet due to dizziness, HR WNL, pt felt able to ambulate back to room after 5 min rest break however dizzy upon return to bed, BP 93/57 mmHg (RN notified)  Stairs            Wheelchair Mobility    Modified Rankin (Stroke Patients Only)       Balance                                             Pertinent Vitals/Pain Pain Assessment: No/denies pain    Home Living Family/patient expects to be discharged to:: Private residence Living Arrangements: Spouse/significant other Available Help at Discharge: Family Type of Home: House       Home Layout: One level Home Equipment: None      Prior Function Level of Independence: Independent               Hand Dominance        Extremity/Trunk Assessment               Lower Extremity Assessment: Generalized weakness         Communication   Communication: No difficulties  Cognition Arousal/Alertness: Awake/alert Behavior During Therapy: WFL for tasks assessed/performed Overall Cognitive Status: Within Functional Limits for tasks assessed                      General Comments      Exercises        Assessment/Plan  PT Assessment Patient needs continued PT services  PT Diagnosis Difficulty walking;Generalized weakness   PT Problem List Decreased strength;Decreased activity tolerance;Decreased mobility;Cardiopulmonary status limiting activity;Decreased knowledge of use of DME  PT Treatment Interventions DME instruction;Gait training;Functional mobility training;Patient/family education;Therapeutic activities;Therapeutic exercise   PT Goals (Current goals can be found in the Care Plan section) Acute Rehab PT Goals PT Goal Formulation: With patient/family Time For Goal Achievement: 08/05/15 Potential to Achieve Goals: Good    Frequency Min 3X/week   Barriers to discharge        Co-evaluation               End of Session  Equipment Utilized During Treatment: Gait belt Activity Tolerance: Other (comment) (limited by dizziness) Patient left: in bed;with call bell/phone within reach;with bed alarm set;with family/visitor present Nurse Communication: Mobility status    Functional Assessment Tool Used: clinical judgement Functional Limitation: Mobility: Walking and moving around Mobility: Walking and Moving Around Current Status [id number]): At least 20 percent but less than 40 percent impaired, limited or restricted Mobility: Walking and Moving Around Goal Status [phone]): At least 1 percent but less than 20 percent impaired, limited or restricted    Time: 1434-1450 PT Time Calculation (min) (ACUTE ONLY): 16 min   Charges:   PT Evaluation $PT Eval Low Complexity: 1 Procedure     PT G Codes:   PT G-Codes **NOT FOR INPATIENT CLASS** Functional Assessment Tool Used: clinical judgement Functional Limitation: Mobility: Walking and moving around Mobility: Walking and Moving Around Current Status [id number]): At least 20 percent but less than 40 percent impaired, limited or restricted Mobility: Walking and Moving Around Goal Status [phone]): At least 1 percent but less than 20 percent impaired, limited or restricted    Ellie Spickler,KATHrine E 07/29/2015, 3:54 PM Carmelia Bake, PT, DPT 07/29/2015 Pager: [phone]

## 2015-07-29 NOTE — Progress Notes (Addendum)
Pt just realizes that she didn't tell the doctor that she had a root canal done about 12 days go and it was after that that she lost her appetite and started feeling poorly.  No drainage or redness or pain  from right lower area in mouth where root canal done.  Telemetry has been SR without any episodes of afib since admission. Pt also mentions that she stopped taking her iron tablet about 2 - 3 months ago b/c her pharmacy needs a new order from md and she would like a new order when she is d/c'd please.

## 2015-07-29 NOTE — Progress Notes (Signed)
Trop is now 1.06, vss, remains SR, no complaints.  Kathline Magic now on call and notified and he acknowledges and cbc and cmet results given.   No new orders at this time.  Will continue to monitor.

## 2015-07-29 NOTE — Care Management Obs Status (Signed)
[place] NOTIFICATION   Patient Details  Name: Ebony Herring MRN: [id number] Date of Birth: 1931/05/31   Medicare Observation Status Notification Given:  Yes    Erenest Rasher, RN 07/29/2015, 5:24 PM

## 2015-07-29 NOTE — Care Management Note (Signed)
Case Management Note  Patient Details  Name: Ebony Herring MRN: [id number] Date of Birth: [birth year]  Subjective/Objective:   dehydration, dizziness and presyncope                Action/Plan: Discharge Planning:   Wilhemena Durie F  NCM spoke to pt at bedside. Offered choice for HH/provided [facility] list. Pt agreeable to [facility] for HH. Pt will also need RW for home. States she does not have any DME at home. Will have NCM follow up on 6/19 to order DME and notify [facility] [place] needed.   Expected Discharge Date:  07/30/15               Expected Discharge Plan:  [place]  In-House Referral:  NA  Discharge planning Services  CM Consult  Post Acute Care Choice:  Home Health Choice offered to:  Patient  DME Arranged:  Walker rolling DME Agency:  [place]:  PT [facility] Agency:  [place]  Status of Service:  In process, will continue to follow  Medicare Important Message Given:    Date Medicare IM Given:    Medicare IM give by:    Date Additional Medicare IM Given:    Additional Medicare Important Message give by:     If discussed at [place] of Stay Meetings, dates discussed:    Additional Comments:  Erenest Rasher, RN 07/29/2015, 5:21 PM

## 2015-07-30 ENCOUNTER — Encounter (HOSPITAL_COMMUNITY): Payer: Self-pay | Admitting: *Deleted

## 2015-07-30 ENCOUNTER — Observation Stay (HOSPITAL_BASED_OUTPATIENT_CLINIC_OR_DEPARTMENT_OTHER): Payer: Medicare Other

## 2015-07-30 ENCOUNTER — Observation Stay (HOSPITAL_COMMUNITY): Payer: Medicare Other

## 2015-07-30 DIAGNOSIS — I4891 Unspecified atrial fibrillation: Secondary | ICD-10-CM | POA: Diagnosis not present

## 2015-07-30 DIAGNOSIS — T17998A Other foreign object in respiratory tract, part unspecified causing other injury, initial encounter: Secondary | ICD-10-CM

## 2015-07-30 DIAGNOSIS — N179 Acute kidney failure, unspecified: Secondary | ICD-10-CM | POA: Diagnosis not present

## 2015-07-30 DIAGNOSIS — J690 Pneumonitis due to inhalation of food and vomit: Secondary | ICD-10-CM | POA: Diagnosis not present

## 2015-07-30 DIAGNOSIS — R7989 Other specified abnormal findings of blood chemistry: Secondary | ICD-10-CM | POA: Diagnosis not present

## 2015-07-30 DIAGNOSIS — E860 Dehydration: Secondary | ICD-10-CM | POA: Diagnosis not present

## 2015-07-30 DIAGNOSIS — J9601 Acute respiratory failure with hypoxia: Secondary | ICD-10-CM

## 2015-07-30 DIAGNOSIS — T17908A Unspecified foreign body in respiratory tract, part unspecified causing other injury, initial encounter: Secondary | ICD-10-CM

## 2015-07-30 DIAGNOSIS — R0902 Hypoxemia: Secondary | ICD-10-CM | POA: Diagnosis not present

## 2015-07-30 DIAGNOSIS — R1312 Dysphagia, oropharyngeal phase: Secondary | ICD-10-CM | POA: Diagnosis not present

## 2015-07-30 LAB — ECHOCARDIOGRAM COMPLETE
CHL CUP TV REG PEAK VELOCITY: 279 cm/s
EERAT: 7.21
EWDT: 173 ms
FS: 37 % (ref 28–44)
Height: 64 in
IV/PV OW: 1.06
LA ID, A-P, ES: 21 mm
LA diam index: 1.31 cm/m2
LA vol index: 22.3 mL/m2
LA vol: 35.6 mL
LAVOLA4C: 28.7 mL
LEFT ATRIUM END SYS DIAM: 21 mm
LV E/e'average: 7.21
LV TDI E'MEDIAL: 6.96
LV e' LATERAL: 8.27 cm/s
LVEEMED: 7.21
LVOT area: 2.84 cm2
LVOT diameter: 19 mm
MV Dec: 173
MV pk E vel: 59.6 m/s
MVPKAVEL: 86.4 m/s
PW: 7.29 mm — AB (ref 0.6–1.1)
TDI e' lateral: 8.27
TR max vel: 279 cm/s
Weight: 1998.4 oz

## 2015-07-30 LAB — CBC
HEMATOCRIT: 26.5 % — AB (ref 36.0–46.0)
HEMOGLOBIN: 8.4 g/dL — AB (ref 12.0–15.0)
MCH: 29.7 pg (ref 26.0–34.0)
MCHC: 31.7 g/dL (ref 30.0–36.0)
MCV: 93.6 fL (ref 78.0–100.0)
Platelets: 204 10*3/uL (ref 150–400)
RBC: 2.83 MIL/uL — AB (ref 3.87–5.11)
RDW: 15.5 % (ref 11.5–15.5)
WBC: 10.8 10*3/uL — AB (ref 4.0–10.5)

## 2015-07-30 LAB — BASIC METABOLIC PANEL
ANION GAP: 9 (ref 5–15)
BUN: 26 mg/dL — ABNORMAL HIGH (ref 6–20)
CHLORIDE: 111 mmol/L (ref 101–111)
CO2: 18 mmol/L — AB (ref 22–32)
Calcium: 8 mg/dL — ABNORMAL LOW (ref 8.9–10.3)
Creatinine, Ser: 1.17 mg/dL — ABNORMAL HIGH (ref 0.44–1.00)
GFR calc non Af Amer: 43 mL/min — ABNORMAL LOW (ref >60)
GFR, EST AFRICAN AMERICAN: 49 mL/min — AB (ref >60)
Glucose, Bld: 133 mg/dL — ABNORMAL HIGH (ref 65–99)
POTASSIUM: 4.6 mmol/L (ref 3.5–5.1)
SODIUM: 138 mmol/L (ref 135–145)

## 2015-07-30 LAB — URINE CULTURE: Culture: NO GROWTH

## 2015-07-30 MED ORDER — ALBUTEROL SULFATE (2.5 MG/3ML) 0.083% IN NEBU: 2.5000 mg | INHALATION_SOLUTION | RESPIRATORY_TRACT | Status: DC | PRN

## 2015-07-30 MED ORDER — SODIUM CHLORIDE 0.9 % IV SOLN
INTRAVENOUS | Status: DC
  Administered 2015-07-30: 75 mL via INTRAVENOUS
  Administered 2015-07-31: 02:00:00 via INTRAVENOUS

## 2015-07-30 NOTE — Progress Notes (Addendum)
PROGRESS NOTE  Ebony Herring [id number] DOB: [birth year] DOA: 07/28/2015 PCP: [name], MD  Brief Narrative: 8yow PMH endometrial cancer s/p resection 2015 with chemoraditation ending 8/205 admitted for dehydration, dizziness, presyncope.  Assessment/Plan: 1. Acute hypoxic respiratory failure secondary to aspiration of food, liquids. History suggestive of possible aspiration at home as well. Significantly hypoxic but currently nontoxic. 2. Generalized weakness, dizziness, nausea, presyncope. Etiology unclear. Has had associated failure to thrive at home with weight loss. Known history of cancer. Also known recent root canal which patient apparently attributes some weight loss 2. 3. Elevated troponin. Per cardiology no CP, no evidence of ACS. Echo pending. 4. Elevated TSH. Started on levothyroxine. 5. Normocytic anemia, suspect anemia of chronic disease, appears close to baseline. 6. AKI resolving with IV fluids. Suspect related to poor oral intake. 7. Hypomagnesemia  8. Endometrial CA with chemo and XRT 2 years ago with peripheral neuropathy secondary to chemotherapy 2. Abnormal appearance of the liver. Known right liver lobe hemangioma, but evidence of loose 1 at other hypo attenuating lesion. Overall heterogeneous echogenicity could be due to poorly defined additional lesions. There is also small amount ascites. Recommend follow-up CT of the abdomen pelvis with contrast for further assessment.   Condition guarded with unclear prognosis with acute aspiration, hypoxia. Chest x-ray no acute disease. Plan nothing by mouth, speech therapy evaluation. Review chest x-ray findings and status with patient, husband and son at bedside. Discussed CODE STATUS, patient DO NOT RESUSCITATE/DO NOT INTUBATE. Family accepts this.  Aggressive NT suction as needed, monitor for fever or decompensation  IV fluids  Follow-up echocardiogram, further recommendations per cardiology  Check BMP,  magnesium and phosphorus in the morning  Consider CT abdomen pelvis and near future  ADDENDUM Much improved this afternoon, resting comfortably, stable on [place].  DVT prophylaxis: SCDs Code Status: DNR/DNI Family Communication:  Disposition Plan: pending  Murray Hodgkins, MD  [facility] Direct contact: [phone] --[place]  --[email or web address]; password [email or web address]  7PM-7AM contact night coverage as above 07/30/2015, 1:59 PM    Consultants:  Cardiology  PT: [facility]  Procedures:    Antimicrobials:    HPI/Subjective: Choked on a piece of candy earlier, also coughing with liquids. Reports some coughing with liquids as an outpatient. Per RN, became hypoxic today after choking on some liquids.  Currently denies shortness of breath, no pain.  Objective: Filed Vitals:   07/29/15 1458 07/29/15 2133 07/30/15 0609 07/30/15 1029  BP: 101/62 123/75 101/64 118/64  Pulse: 69 73 68 65  Temp: 97.6 F (36.4 C) 98.7 F (37.1 C) 97.4 F (36.3 C)   TempSrc: Oral Oral Oral   Resp: 18 18 20    Height:      Weight:      SpO2: 94% 94% 97%     Intake/Output Summary (Last 24 hours) at 07/30/15 1359 Last data filed at 07/30/15 0700  Gross per 24 hour  Intake   1080 ml  Output    600 ml  Net    480 ml     Filed Weights   07/28/15 1959  Weight: 56.654 kg (124 lb 14.4 oz)    Exam:    Constitutional:  . Appears calm and comfortable, SOB but not toxic, no apparent distress Eyes:  . PERRL and irises appear normal ENMT:  . grossly normal hearing  Respiratory:  . Coarse upper airway sound, secretions, no frank wheezes or rales. Marland Kitchen Respiratory effort mildly increased. Speaks in full sentences. No retractions or accessory muscle use Cardiovascular:  .  RRR, no m/r/g . No LE extremity edema   . Telemetry SR Musculoskeletal:  Moves all extremities to command Psychiatric:  . judgement and insight appear normal, participates in history and examination . Mental  status o Mood, affect appropriate  I have personally reviewed following labs and imaging studies:  Stat chest x-ray shows no acute disease. Large hiatal hernia.  Creatinine continues to trend downwards  CO2 slightly low, 18  Hemoglobin stable at 0.4  TSH high, 9.655  Scheduled Meds: . Ferrous Fumarate  1 tablet Oral Daily   Continuous Infusions: . sodium chloride      Principal Problem:   Acute respiratory failure with hypoxia (HCC) Active Problems:   Peripheral neuropathy due to chemotherapy (HCC)   Dehydration   AKI (acute kidney injury) ([place])   Elevated troponin   Dizziness   Elevated TSH   Aspiration into airway     Time spent 35 minutes

## 2015-07-30 NOTE — Progress Notes (Signed)
SLP Cancellation Note  Patient Details Name: JANELYN FARRIOR MRN: [id number] DOB: 01/02/1932   Cancelled treatment:       Reason Eval/Treat Not Completed: Patient not medically ready. SLp visited pt as she was recovering from significant aspiration event; Rapid response RN providing NTS sucttion with significant return of chocolate tinged secretions. Pt was eating chocolate candy. RN also reports pt had some coughing with am meal. Significant wet vocal quality and congested breathing and coughing observed at bedside. SLP to f/u tomorrow; pt to remain NPO today. Pt and family in agreement. They state intake has been poor, but deny any dysphagia PTA.    Mika Anastasi, Katherene Ponto 07/30/2015, 1:58 PM

## 2015-07-30 NOTE — Progress Notes (Signed)
Gurgling sound noted from hallway of patients room. VS assessed 98/66, 66, 78% ra. Non-rebreather mask applied, O2 sat increased 91%. Pt denies complaints at this time. Provider contacted

## 2015-07-30 NOTE — Progress Notes (Signed)
Echocardiogram 2D Echocardiogram has been performed.  Ebony Herring 07/30/2015, 4:09 PM

## 2015-07-30 NOTE — Progress Notes (Signed)
PT Cancellation Note  Patient Details Name: Ebony Herring MRN: [id number] DOB: [birth year]   Cancelled Treatment:    Reason Eval/Treat Not Completed: Medical issues which prohibited therapy. Noted recent episode entered in chart by RN. Will hold PT at this time and check back another day. Thanks.   Weston Anna, MPT Pager: [phone]

## 2015-07-30 NOTE — Plan of Care (Signed)
Problem: Safety: Goal: Ability to remain free from injury will improve Outcome: Completed/Met Date Met:  07/30/15 Pt/family provided on safety precautions. Pt was also encouraged to call when needing to use the rest room

## 2015-07-30 NOTE — Progress Notes (Signed)
Patient Name: Ebony Herring Date of Encounter: 07/30/2015  Primary Cardiologist: Dr. Johnsie Cancel   Patient profile:  82 y.o. no previous cardiac history . History of endometrial CA with chemo and XRT 2 years ago. Came in with poor oral intake, but denies any CP, trop was mildly elevated. TSH elevated at 9.65. Last abdominal CT with ? Rectal lesion in 2015  Principal Problem:   Dehydration Active Problems:   Peripheral neuropathy due to chemotherapy (HCC)   AKI (acute kidney injury) ([place])   Elevated troponin   Dizziness   Elevated TSH    SUBJECTIVE  Denies ever having CP or SOB. Says the reason she came in is because she says she was getting dizzy with standing up and was afraid she might fall.   CURRENT MEDS . aspirin EC  81 mg Oral Daily  . Ferrous Fumarate  1 tablet Oral Daily  . levothyroxine  50 mcg Oral QAC breakfast  . metoprolol tartrate  12.5 mg Oral BID    OBJECTIVE  Filed Vitals:   07/29/15 1445 07/29/15 1458 07/29/15 2133 07/30/15 0609  BP: 93/57 101/62 123/75 101/64  Pulse: 72 69 73 68  Temp:  97.6 F (36.4 C) 98.7 F (37.1 C) 97.4 F (36.3 C)  TempSrc:  Oral Oral Oral  Resp:  18 18 20   Height:      Weight:      SpO2: 98% 94% 94% 97%    Intake/Output Summary (Last 24 hours) at 07/30/15 0853 Last data filed at 07/30/15 0700  Gross per 24 hour  Intake   1280 ml  Output    600 ml  Net    680 ml   Filed Weights   07/28/15 1959  Weight: 124 lb 14.4 oz (56.654 kg)    PHYSICAL EXAM  General: Pleasant, NAD. Neuro: Alert and oriented X 3. Moves all extremities spontaneously. Psych: Normal affect. HEENT:  Normal  Neck: Supple without bruits or JVD. Lungs:  Resp regular and unlabored, CTA. Heart: RRR no s3, s4, or murmurs. Abdomen: Soft, non-tender, non-distended, BS + x 4.  Extremities: No clubbing, cyanosis or edema. DP/PT/Radials 2+ and equal bilaterally.  Accessory Clinical Findings  CBC  Recent Labs  07/28/15 1154 07/29/15 0300  07/30/15 0513  WBC 10.9* 9.3 10.8*  NEUTROABS 8.6*  --   --   HGB 9.8* 8.6* 8.4*  HCT 30.9* 27.3* 26.5*  MCV 93.1 93.2 93.6  PLT 291 225 0000000   Basic Metabolic Panel  Recent Labs  07/29/15 0300 07/29/15 1105 07/30/15 0513  NA 138  --  138  K 4.4  --  4.6  CL 109  --  111  CO2 19*  --  18*  GLUCOSE 118*  --  133*  BUN 26*  --  26*  CREATININE 1.30*  --  1.17*  CALCIUM 7.8*  --  8.0*  MG  --  1.3*  --    Cardiac Enzymes  Recent Labs  07/28/15 2050 07/29/15 0300 07/29/15 0825  TROPONINI 0.92* 1.06* 0.93*   Thyroid Function Tests  Recent Labs  07/29/15 1105  TSH 9.655*    TELE NSR without significant ventricular ectopy    ECG  NSR with TWI in III, AVF, V2-V4  Echocardiogram  pending    Radiology/Studies  Dg Chest 2 View  07/28/2015  CLINICAL DATA:  [name]. EXAM: CHEST  2 VIEW COMPARISON:  Chest CT 11/14/2013; chest radiograph 02/25/2013. FINDINGS: Right anterior chest wall Port-A-Cath with tip projecting over  the superior vena cava. Large hiatal hernia. Stable cardiac and mediastinal contours. No consolidative pulmonary opacities. No pleural effusion or pneumothorax. Cholecystectomy clips. Osseous skeleton is unremarkable. IMPRESSION: No active cardiopulmonary disease. Electronically Signed   By: Lovey Newcomer M.D.   On: 07/28/2015 11:52   Ct Head Wo Contrast  07/28/2015  CLINICAL DATA:  Hx of endometrial ca and is currently having dizziness that brought her to the ED EXAM: CT HEAD WITHOUT CONTRAST TECHNIQUE: Contiguous axial images were obtained from the base of the skull through the vertex without intravenous contrast. COMPARISON:  None. FINDINGS: The ventricles are normal in configuration. There is ventricular and sulcal enlargement reflecting mild atrophy. No hydrocephalus. There are no parenchymal masses or mass effect. There is no evidence of an infarct. Mild periventricular white matter hypoattenuation is noted consistent with chronic  microvascular ischemic change. There are no extra-axial masses or abnormal fluid collections. There is no intracranial hemorrhage. There is dependent fluid in the left maxillary sinus. There is a posterior mucous retention cysts left sphenoid sinus. Remaining visualized sinuses are clear as are the mastoid air cells. No skull fracture. IMPRESSION: 1. No acute intracranial abnormalities.  No skull fracture. 2. Air-fluid level in the left maxillary sinus. This is nonspecific. Consider acute sinusitis in the proper clinical setting. Electronically Signed   By: Lajean Manes M.D.   On: 07/28/2015 17:52   US Renal  07/29/2015  CLINICAL DATA:  Acute kidney injury. History of endometrial carcinoma. EXAM: RENAL / URINARY TRACT ULTRASOUND COMPLETE COMPARISON:  CT, 11/14/2013 FINDINGS: Right Kidney: Length: 9.2 cm. Renal cortical thinning. Normal parenchymal echogenicity. No mass, stone or hydronephrosis. Left Kidney: Length: 8.4 cm. Renal cortical thinning. Normal parenchymal echogenicity. No mass, stone or hydronephrosis. Bladder: Appears normal for degree of bladder distention. There is a hyperechoic right lobe lesion consistent with 1 of the hemangiomas noted on the prior CT. However, the liver also shows heterogeneous echogenicity and a possible hypoechoic mass from the inferior right lobe measuring 4.6 cm. This has no correlate on the prior CT. There is also small amount ascites. IMPRESSION: 1. Bilateral renal cortical thinning. No renal masses, stones or hydronephrosis. 2. Abnormal appearance of the liver. Known right liver lobe hemangioma, but evidence of loose 1 at other hypo attenuating lesion. Overall heterogeneous echogenicity could be due to poorly defined additional lesions. There is also small amount ascites. Recommend follow-up CT of the abdomen pelvis with contrast for further assessment. Electronically Signed   By: Lajean Manes M.D.   On: 07/29/2015 14:22    ASSESSMENT AND PLAN  1. Elevated trop  -  Denies any CP. Trop 0.92 --> 1.06 --> 0.93. Non-ACS pattern. TWI in lead III, aVF, V2-V4, pending echo, if normal then probably does not need further inpatient workup.    2. Chronic anemia: hgb worsened after hydration, dilutional?  3. Elevated TSH: per hospitalist, started on low dose synthroid  4. Fatigue and poor PO intake: given h/o endometrial CA, recommended abdominal CT w/ contrast  - presenting symptom sounds like orthostatic hypotension, which can be more pronounced with dehydration, recommended hydration. She does appears to have a orthostatic vital sign on arrival, but it was done while her BP was very low, it does show jump in HR with changing body position. Lying 88/62 HR 85, Sitting 98/52 HR 94, Standing 89/58 HR 102     5. AKI: renal function improving.  Signed, Almyra Deforest PA-C Pager: [phone]  Personally seen and examined. Agree with above. No chest pain.  Elevated but flat troponin. Await echo. Upper airway secretions noted.  Hydration.   Candee Furbish, MD

## 2015-07-31 ENCOUNTER — Observation Stay (HOSPITAL_COMMUNITY): Payer: Medicare Other

## 2015-07-31 DIAGNOSIS — R531 Weakness: Secondary | ICD-10-CM | POA: Diagnosis present

## 2015-07-31 DIAGNOSIS — I959 Hypotension, unspecified: Secondary | ICD-10-CM | POA: Diagnosis present

## 2015-07-31 DIAGNOSIS — T17998A Other foreign object in respiratory tract, part unspecified causing other injury, initial encounter: Secondary | ICD-10-CM | POA: Diagnosis not present

## 2015-07-31 DIAGNOSIS — N179 Acute kidney failure, unspecified: Secondary | ICD-10-CM | POA: Diagnosis not present

## 2015-07-31 DIAGNOSIS — D1803 Hemangioma of intra-abdominal structures: Secondary | ICD-10-CM | POA: Diagnosis present

## 2015-07-31 DIAGNOSIS — I248 Other forms of acute ischemic heart disease: Secondary | ICD-10-CM | POA: Diagnosis present

## 2015-07-31 DIAGNOSIS — Z90710 Acquired absence of both cervix and uterus: Secondary | ICD-10-CM | POA: Diagnosis not present

## 2015-07-31 DIAGNOSIS — D638 Anemia in other chronic diseases classified elsewhere: Secondary | ICD-10-CM | POA: Diagnosis present

## 2015-07-31 DIAGNOSIS — R634 Abnormal weight loss: Secondary | ICD-10-CM | POA: Diagnosis present

## 2015-07-31 DIAGNOSIS — Z923 Personal history of irradiation: Secondary | ICD-10-CM | POA: Diagnosis not present

## 2015-07-31 DIAGNOSIS — Z7189 Other specified counseling: Secondary | ICD-10-CM | POA: Diagnosis not present

## 2015-07-31 DIAGNOSIS — Z79899 Other long term (current) drug therapy: Secondary | ICD-10-CM | POA: Diagnosis not present

## 2015-07-31 DIAGNOSIS — R1312 Dysphagia, oropharyngeal phase: Secondary | ICD-10-CM | POA: Diagnosis not present

## 2015-07-31 DIAGNOSIS — Z9221 Personal history of antineoplastic chemotherapy: Secondary | ICD-10-CM | POA: Diagnosis not present

## 2015-07-31 DIAGNOSIS — E860 Dehydration: Secondary | ICD-10-CM | POA: Diagnosis present

## 2015-07-31 DIAGNOSIS — G9340 Encephalopathy, unspecified: Secondary | ICD-10-CM | POA: Diagnosis present

## 2015-07-31 DIAGNOSIS — I071 Rheumatic tricuspid insufficiency: Secondary | ICD-10-CM | POA: Diagnosis present

## 2015-07-31 DIAGNOSIS — E872 Acidosis: Secondary | ICD-10-CM | POA: Diagnosis present

## 2015-07-31 DIAGNOSIS — Z515 Encounter for palliative care: Secondary | ICD-10-CM | POA: Diagnosis not present

## 2015-07-31 DIAGNOSIS — J9601 Acute respiratory failure with hypoxia: Secondary | ICD-10-CM

## 2015-07-31 DIAGNOSIS — C801 Malignant (primary) neoplasm, unspecified: Secondary | ICD-10-CM | POA: Diagnosis not present

## 2015-07-31 DIAGNOSIS — C541 Malignant neoplasm of endometrium: Secondary | ICD-10-CM | POA: Diagnosis present

## 2015-07-31 DIAGNOSIS — I272 Other secondary pulmonary hypertension: Secondary | ICD-10-CM | POA: Diagnosis present

## 2015-07-31 DIAGNOSIS — J690 Pneumonitis due to inhalation of food and vomit: Secondary | ICD-10-CM | POA: Diagnosis not present

## 2015-07-31 DIAGNOSIS — Z803 Family history of malignant neoplasm of breast: Secondary | ICD-10-CM | POA: Diagnosis not present

## 2015-07-31 DIAGNOSIS — R0600 Dyspnea, unspecified: Secondary | ICD-10-CM | POA: Diagnosis not present

## 2015-07-31 DIAGNOSIS — R918 Other nonspecific abnormal finding of lung field: Secondary | ICD-10-CM | POA: Diagnosis not present

## 2015-07-31 DIAGNOSIS — R188 Other ascites: Secondary | ICD-10-CM | POA: Diagnosis present

## 2015-07-31 DIAGNOSIS — Z881 Allergy status to other antibiotic agents status: Secondary | ICD-10-CM | POA: Diagnosis not present

## 2015-07-31 DIAGNOSIS — Z9181 History of falling: Secondary | ICD-10-CM | POA: Diagnosis not present

## 2015-07-31 DIAGNOSIS — E8770 Fluid overload, unspecified: Secondary | ICD-10-CM | POA: Diagnosis present

## 2015-07-31 DIAGNOSIS — R55 Syncope and collapse: Secondary | ICD-10-CM | POA: Diagnosis present

## 2015-07-31 DIAGNOSIS — R627 Adult failure to thrive: Secondary | ICD-10-CM | POA: Diagnosis present

## 2015-07-31 DIAGNOSIS — Z66 Do not resuscitate: Secondary | ICD-10-CM | POA: Diagnosis present

## 2015-07-31 DIAGNOSIS — F05 Delirium due to known physiological condition: Secondary | ICD-10-CM | POA: Diagnosis present

## 2015-07-31 DIAGNOSIS — G620 Drug-induced polyneuropathy: Secondary | ICD-10-CM | POA: Diagnosis present

## 2015-07-31 DIAGNOSIS — H4089 Other specified glaucoma: Secondary | ICD-10-CM | POA: Diagnosis present

## 2015-07-31 DIAGNOSIS — T451X5A Adverse effect of antineoplastic and immunosuppressive drugs, initial encounter: Secondary | ICD-10-CM | POA: Diagnosis present

## 2015-07-31 DIAGNOSIS — W1839XA Other fall on same level, initial encounter: Secondary | ICD-10-CM | POA: Diagnosis present

## 2015-07-31 DIAGNOSIS — E875 Hyperkalemia: Secondary | ICD-10-CM | POA: Diagnosis present

## 2015-07-31 LAB — BASIC METABOLIC PANEL
ANION GAP: 11 (ref 5–15)
Anion gap: 13 (ref 5–15)
BUN: 36 mg/dL — ABNORMAL HIGH (ref 6–20)
BUN: 42 mg/dL — AB (ref 6–20)
CALCIUM: 8.1 mg/dL — AB (ref 8.9–10.3)
CHLORIDE: 116 mmol/L — AB (ref 101–111)
CO2: 14 mmol/L — ABNORMAL LOW (ref 22–32)
CO2: 15 mmol/L — AB (ref 22–32)
CREATININE: 1.83 mg/dL — AB (ref 0.44–1.00)
Calcium: 8 mg/dL — ABNORMAL LOW (ref 8.9–10.3)
Chloride: 113 mmol/L — ABNORMAL HIGH (ref 101–111)
Creatinine, Ser: 1.86 mg/dL — ABNORMAL HIGH (ref 0.44–1.00)
GFR calc Af Amer: 28 mL/min — ABNORMAL LOW (ref >60)
GFR, EST AFRICAN AMERICAN: 29 mL/min — AB (ref >60)
GFR, EST NON AFRICAN AMERICAN: 25 mL/min — AB (ref >60)
GFR, EST NON AFRICAN AMERICAN: 24 mL/min — AB (ref >60)
GLUCOSE: 136 mg/dL — AB (ref 65–99)
Glucose, Bld: 159 mg/dL — ABNORMAL HIGH (ref 65–99)
POTASSIUM: 4.6 mmol/L (ref 3.5–5.1)
Potassium: 5.7 mmol/L — ABNORMAL HIGH (ref 3.5–5.1)
SODIUM: 140 mmol/L (ref 135–145)
Sodium: 142 mmol/L (ref 135–145)

## 2015-07-31 LAB — CBC
HCT: 27.6 % — ABNORMAL LOW (ref 36.0–46.0)
Hemoglobin: 8.7 g/dL — ABNORMAL LOW (ref 12.0–15.0)
MCH: 30.1 pg (ref 26.0–34.0)
MCHC: 31.5 g/dL (ref 30.0–36.0)
MCV: 95.5 fL (ref 78.0–100.0)
PLATELETS: 210 10*3/uL (ref 150–400)
RBC: 2.89 MIL/uL — ABNORMAL LOW (ref 3.87–5.11)
RDW: 15.5 % (ref 11.5–15.5)
WBC: 14.9 10*3/uL — ABNORMAL HIGH (ref 4.0–10.5)

## 2015-07-31 LAB — MAGNESIUM: MAGNESIUM: 2.1 mg/dL (ref 1.7–2.4)

## 2015-07-31 LAB — PHOSPHORUS: PHOSPHORUS: 6.1 mg/dL — AB (ref 2.5–4.6)

## 2015-07-31 MED ORDER — SODIUM CHLORIDE 0.9 % IV BOLUS (SEPSIS)
500.0000 mL | Freq: Once | INTRAVENOUS | Status: AC
  Administered 2015-07-31: 500 mL via INTRAVENOUS

## 2015-07-31 MED ORDER — SODIUM CHLORIDE 0.45 % IV SOLN
INTRAVENOUS | Status: DC
  Administered 2015-07-31 – 2015-08-02 (×5): via INTRAVENOUS

## 2015-07-31 MED ORDER — SODIUM POLYSTYRENE SULFONATE 15 GM/60ML PO SUSP
15.0000 g | Freq: Once | ORAL | Status: AC
  Administered 2015-07-31: 15 g via RECTAL
  Filled 2015-07-31: qty 60

## 2015-07-31 NOTE — Progress Notes (Addendum)
PROGRESS NOTE  Ebony Herring [id number] DOB: Jan 01, 1932 DOA: 07/28/2015 PCP: [name], MD  Brief Narrative: 86yow PMH endometrial cancer s/p resection 2015 with chemoraditation ending 8/205 admitted for dehydration, dizziness, presyncope.  Assessment/Plan: 1. Acute hypoxic respiratory failure secondary to aspiration of food and liquids. History also suggestive of aspiration at home. Appears to be stable on 3 L. 2. Acute kidney injury with associated hyperkalemia. Suspect secondary to poor oral intake and acute illness. None on any nephrotoxins. 3. Elevated troponin. No further evaluation per cardiology. No evidence of ACS. Echocardiogram noted. 4. Generalized weakness, dizziness, nausea. Etiology unclear. Failure to thrive at home with weight loss. 5. Elevated TSH. Started on levothyroxine. 6. Stable normocytic anemia, suspected anemia of chronic disease 7. PMH Endometrial CA with chemo and XRT 2 years ago with peripheral neuropathy secondary to chemotherapy. On renal u/s: Abnormal appearance of the liver. Known right liver lobe hemangioma, but evidence of loose 1 at other hypo attenuating lesion. Overall heterogeneous echogenicity could be due to poorly defined additional lesions. There is also small amount ascites.Recommend follow-up CT of the abdomen pelvis with contrast for further assessment   Appears somewhat worse today although oxygen requirement is stable. Plan for modified barium swallow today.  Continue IV fluids, status post Kayexalate, not in any nephrotoxins. Recheck basic metabolic panel in the morning.  Repeat chest x-ray in AM  Consider CT abdomen and pelvis if renal function normalizes  ADDENDUM Resting comfortably, confused. Potassium has normalized No family present this evening  Plan Keep NPO, will discuss next steps with family in AM.  DVT prophylaxis: SCDs Code Status: DNR/DNI Family Communication:  Disposition Plan: pending  Murray Hodgkins,  MD  [facility] Direct contact: [phone] --[place] [place]  --[email or web address]; password [email or web address]  7PM-7AM contact night coverage as above 07/31/2015, 2:21 PM    Consultants:  Cardiology  PT: [facility]  Procedures:  Echo Study Conclusions  - Left ventricle: The cavity size was normal. Systolic function was  normal. The estimated ejection fraction was in the range of 60%  to 65%. Wall motion was normal; there were no regional wall  motion abnormalities. Doppler parameters are consistent with  abnormal left ventricular relaxation (grade 1 diastolic  dysfunction). Doppler parameters are consistent with elevated  ventricular end-diastolic filling pressure. - Ventricular septum: The contour showed diastolic flattening  consistent with RV volume overload. - Aortic valve: There was mild regurgitation. - Mitral valve: Transvalvular velocity was within the normal range.  There was no evidence for stenosis. There was mild regurgitation  directed posteriorly. - Right ventricle: The cavity size was moderately dilated. Wall  thickness was normal. Systolic function was moderately reduced. - Right atrium: The atrium was severely dilated. - Atrial septum: No defect or patent foramen ovale was identified  by color flow Doppler. - Tricuspid valve: There was moderate regurgitation. - Pulmonary arteries: Systolic pressure was mildly increased. PA  peak pressure: 46 mm Hg (S). - Inferior vena cava: The vessel was dilated. The respirophasic  diameter changes were blunted (< 50%), consistent with elevated  central venous pressure.  Antimicrobials:    HPI/Subjective: Minimal UOP overnight.  More short of breath today.  Objective: Filed Vitals:   07/30/15 1029 07/30/15 2200 07/31/15 0601 07/31/15 1337  BP: 118/64 121/70 110/60 106/55  Pulse: 65 70 74 79  Temp:  98 F (36.7 C) 98.1 F (36.7 C) 97.5 F (36.4 C)  TempSrc:  Oral Oral Oral  Resp:  20 20 20   Height:  Weight:      SpO2:  98% 99% 98%    Intake/Output Summary (Last 24 hours) at 07/31/15 1421 Last data filed at 07/31/15 0800  Gross per 24 hour  Intake 1401.67 ml  Output      0 ml  Net 1401.67 ml     Filed Weights   07/28/15 1959  Weight: 56.654 kg (124 lb 14.4 oz)    Exam: Constitutional:  . Appears calm and comfortable, mildy ill Respiratory:  . Coarse upper airway noise bilaterally. No rhonchi or rales. . Mild increased respiratory effort. No retractions or accessory muscle use Cardiovascular:  . RRR, no m/r/g . No LE extremity edema   . Telemetry SR Psychiatric:  . judgement and insight appear normal . Mental status o Mood, affect appropriate  I have personally reviewed following labs and imaging studies:  Potassium 5.7, CO2 14, BUN 36, creatinine 1.8.  Hemoglobin stable 8.7  WBC increase, 14.9  Scheduled Meds: . Ferrous Fumarate  1 tablet Oral Daily   Continuous Infusions: . sodium chloride 100 mL/hr at 07/31/15 [id number]    Principal Problem:   Acute respiratory failure with hypoxia (HCC) Active Problems:   Peripheral neuropathy due to chemotherapy (HCC)   Dehydration   AKI (acute kidney injury) ([place])   Elevated troponin   Dizziness   Elevated TSH   Aspiration into airway     Time spent 25 minutes

## 2015-07-31 NOTE — Progress Notes (Signed)
MBS completed, full report to follow in imaging section.  Pt presents with severe oropharyngeal and cervical esophageal dysphagia with sensorimotor deficits.  Pt accepted only small amounts of thin, nectar and pudding.  Delayed oral transiting with lingual pumping present due to dis-coordination, ? Cognitive deficits.    Pt pharyngeal swallow was weak resulting in gross residuals across all consistencies tested without pt awareness/sensation.  She had poor UES relaxation contributing to gross residuals mixed with secretions.  Approximately only 40% of bolus enter into esophagus even with multiple dry swallows unforunately.  Cued cough and attempt at expectoration was too weak to be clear.    Pt is at aspiration risk with ANY PO intake and pt reports 10 pound weight loss in 5 weeks and chronic cough x4 weeks - causing SLP to question some baseline dysphagia.  Son also reports pt with decreased mobility recently.   Given results of testing and pt/family stated premorbid status, recommend to consider palliative consult to help establish goals.    Using teach back, educated pt and family =  Son inquired if a throat MD would be indicated - advised him to speak to MD re: this question.    SLP questions pt's ability to understand information reviewed as she reported she was an "experiment".   SLP will follow up.   Currently recommend NPO except small single ice chips -  medications via alternative measures.     Luanna Salk, [facility] SLP [phone]

## 2015-07-31 NOTE — Progress Notes (Signed)
Patient Name: Ebony Herring Date of Encounter: 07/31/2015  Primary Cardiologist: Dr. Johnsie Cancel   Patient profile:  82 y.o. no previous cardiac history . History of endometrial CA with chemo and XRT 2 years ago. Came in with poor oral intake, but denies any CP, trop was mildly elevated. TSH elevated at 9.65. Last abdominal CT with ? Rectal lesion in 2015  Principal Problem:   Acute respiratory failure with hypoxia (HCC) Active Problems:   Peripheral neuropathy due to chemotherapy (HCC)   Dehydration   AKI (acute kidney injury) (HCC)   Elevated troponin   Dizziness   Elevated TSH   Aspiration into airway    SUBJECTIVE  Denies ever having CP or SOB. Says the reason she came in is because she says she was getting dizzy with standing up and was afraid she might fall.   She did have some trouble urinating last evening.  CURRENT MEDS . Ferrous Fumarate  1 tablet Oral Daily    OBJECTIVE  Filed Vitals:   07/30/15 0609 07/30/15 1029 07/30/15 2200 07/31/15 0601  BP: 101/64 118/64 121/70 110/60  Pulse: 68 65 70 74  Temp: 97.4 F (36.3 C)  98 F (36.7 C) 98.1 F (36.7 C)  TempSrc: Oral  Oral Oral  Resp: 20  20 20   Height:      Weight:      SpO2: 97%  98% 99%    Intake/Output Summary (Last 24 hours) at 07/31/15 1321 Last data filed at 07/31/15 0800  Gross per 24 hour  Intake 1401.67 ml  Output      0 ml  Net 1401.67 ml   Filed Weights   07/28/15 1959  Weight: 124 lb 14.4 oz (56.654 kg)    PHYSICAL EXAM  General: Pleasant, NAD. Neuro: Alert and oriented X 3. Moves all extremities spontaneously. Psych: Normal affect. HEENT:  Normal  Neck: Supple without bruits or JVD. Lungs:  Resp regular and unlabored, CTA. Heart: RRR no s3, s4, or murmurs. Abdomen: Soft, non-tender, non-distended, BS + x 4.  Extremities: No clubbing, cyanosis or edema. DP/PT/Radials 2+ and equal bilaterally.  Accessory Clinical Findings  CBC  Recent Labs  07/30/15 0513 07/31/15 0510    WBC 10.8* 14.9*  HGB 8.4* 8.7*  HCT 26.5* 27.6*  MCV 93.6 95.5  PLT 204 [id number]   Basic Metabolic Panel  Recent Labs  07/29/15 1105 07/30/15 0513 07/31/15 0510  NA  --  138 140  K  --  4.6 5.7*  CL  --  111 113*  CO2  --  18* 14*  GLUCOSE  --  133* 159*  BUN  --  26* 36*  CREATININE  --  1.17* 1.83*  CALCIUM  --  8.0* 8.1*  MG 1.3*  --  2.1  PHOS  --   --  6.1*   Cardiac Enzymes  Recent Labs  07/28/15 2050 07/29/15 0300 07/29/15 0825  TROPONINI 0.92* 1.06* 0.93*   Thyroid Function Tests  Recent Labs  07/29/15 1105  TSH 9.655*    TELE NSR without significant ventricular ectopy    ECG  NSR with TWI in III, AVF, V2-V4  Echocardiogram  - Left ventricle: The cavity size was normal. Systolic function was  normal. The estimated ejection fraction was in the range of 60%  to 65%. Wall motion was normal; there were no regional wall  motion abnormalities. Doppler parameters are consistent with  abnormal left ventricular relaxation (grade 1 diastolic  dysfunction). Doppler parameters are consistent with  elevated  ventricular end-diastolic filling pressure. - Ventricular septum: The contour showed diastolic flattening  consistent with RV volume overload. - Aortic valve: There was mild regurgitation. - Mitral valve: Transvalvular velocity was within the normal range.  There was no evidence for stenosis. There was mild regurgitation  directed posteriorly. - Right ventricle: The cavity size was moderately dilated. Wall  thickness was normal. Systolic function was moderately reduced. - Right atrium: The atrium was severely dilated. - Atrial septum: No defect or patent foramen ovale was identified  by color flow Doppler. - Tricuspid valve: There was moderate regurgitation. - Pulmonary arteries: Systolic pressure was mildly increased. PA  peak pressure: 46 mm Hg (S). - Inferior vena cava: The vessel was dilated. The respirophasic  diameter changes were  blunted (<     Radiology/Studies  Dg Chest 2 View  07/28/2015  CLINICAL DATA:  [name]. EXAM: CHEST  2 VIEW COMPARISON:  Chest CT 11/14/2013; chest radiograph 02/25/2013. FINDINGS: Right anterior chest wall Port-A-Cath with tip projecting over the superior vena cava. Large hiatal hernia. Stable cardiac and mediastinal contours. No consolidative pulmonary opacities. No pleural effusion or pneumothorax. Cholecystectomy clips. Osseous skeleton is unremarkable. IMPRESSION: No active cardiopulmonary disease. Electronically Signed   By: Lovey Newcomer M.D.   On: 07/28/2015 11:52   Ct Head Wo Contrast  07/28/2015  CLINICAL DATA:  Hx of endometrial ca and is currently having dizziness that brought her to the ED EXAM: CT HEAD WITHOUT CONTRAST TECHNIQUE: Contiguous axial images were obtained from the base of the skull through the vertex without intravenous contrast. COMPARISON:  None. FINDINGS: The ventricles are normal in configuration. There is ventricular and sulcal enlargement reflecting mild atrophy. No hydrocephalus. There are no parenchymal masses or mass effect. There is no evidence of an infarct. Mild periventricular white matter hypoattenuation is noted consistent with chronic microvascular ischemic change. There are no extra-axial masses or abnormal fluid collections. There is no intracranial hemorrhage. There is dependent fluid in the left maxillary sinus. There is a posterior mucous retention cysts left sphenoid sinus. Remaining visualized sinuses are clear as are the mastoid air cells. No skull fracture. IMPRESSION: 1. No acute intracranial abnormalities.  No skull fracture. 2. Air-fluid level in the left maxillary sinus. This is nonspecific. Consider acute sinusitis in the proper clinical setting. Electronically Signed   By: Lajean Manes M.D.   On: 07/28/2015 17:52   US Renal  07/29/2015  CLINICAL DATA:  Acute kidney injury. History of endometrial carcinoma. EXAM: RENAL / URINARY TRACT  ULTRASOUND COMPLETE COMPARISON:  CT, 11/14/2013 FINDINGS: Right Kidney: Length: 9.2 cm. Renal cortical thinning. Normal parenchymal echogenicity. No mass, stone or hydronephrosis. Left Kidney: Length: 8.4 cm. Renal cortical thinning. Normal parenchymal echogenicity. No mass, stone or hydronephrosis. Bladder: Appears normal for degree of bladder distention. There is a hyperechoic right lobe lesion consistent with 1 of the hemangiomas noted on the prior CT. However, the liver also shows heterogeneous echogenicity and a possible hypoechoic mass from the inferior right lobe measuring 4.6 cm. This has no correlate on the prior CT. There is also small amount ascites. IMPRESSION: 1. Bilateral renal cortical thinning. No renal masses, stones or hydronephrosis. 2. Abnormal appearance of the liver. Known right liver lobe hemangioma, but evidence of loose 1 at other hypo attenuating lesion. Overall heterogeneous echogenicity could be due to poorly defined additional lesions. There is also small amount ascites. Recommend follow-up CT of the abdomen pelvis with contrast for further assessment. Electronically Signed   By:  Lajean Manes M.D.   On: 07/29/2015 14:22   Dg Chest Port 1 View  07/30/2015  CLINICAL DATA:  Hypoxia EXAM: PORTABLE CHEST 1 VIEW COMPARISON:  07/28/2015 FINDINGS: Right Port-A-Cath tip in the SVC. Heart is normal size. Large hiatal hernia, stable. Lungs are clear. No effusions. No acute bony abnormality. IMPRESSION: Large hiatal hernia.  No active disease. Electronically Signed   By: Rolm Baptise M.D.   On: 07/30/2015 13:21   Scheduled Meds: . Ferrous Fumarate  1 tablet Oral Daily   Continuous Infusions: . sodium chloride 100 mL/hr at 07/31/15 0644   PRN Meds:.albuterol, polyethylene glycol, sodium chloride flush   ASSESSMENT AND PLAN  1. Elevated troponin  - Denies any CP. Trop 0.92 --> 1.06 --> 0.93. Non-ACS pattern. TWI in lead III, aVF, V2-V4.  - Echocardiogram with reassuring ejection  fraction 60% with no wall motion abnormality. No further ischemic workup necessary.   2. Chronic anemia: hgb worsened after hydration, dilutional?  3. Elevated TSH: per hospitalist, started on low dose synthroid  4. Fatigue and poor PO intake: given h/o endometrial CA, recommended abdominal CT w/ contrast  - presenting symptom sounds like orthostatic hypotension, which can be more pronounced with dehydration, recommended hydration. She does appears to have a orthostatic vital sign on arrival, but it was done while her BP was very low, it does show jump in HR with changing body position. Lying 88/62 HR 85, Sitting 98/52 HR 94, Standing 89/58 HR 102     5. AKI: renal function worse today. Hydrating.  6. Secondary pulmonary hypertension  No further cardiac workup. Reassuring ejection fraction. We will sign off. Please call us with any questions.  Signed,  Candee Furbish, MD

## 2015-07-31 NOTE — Progress Notes (Signed)
Patient with no urinary output for entire shift. Several attempts made to get patient to void with no success.  Bladder scan showed 168ml.  Notified NP on call.  Will continue to monitor patient.

## 2015-07-31 NOTE — Evaluation (Signed)
Clinical/Bedside Swallow Evaluation Patient Details  Name: Ebony Herring MRN: [id number] Date of Birth: [birth year]  Today's Date: 07/31/2015 Time: SLP Start Time (ACUTE ONLY): 1215 SLP Stop Time (ACUTE ONLY): 1250 SLP Time Calculation (min) (ACUTE ONLY): 35 min  Past Medical History:  Past Medical History  Diagnosis Date  . Glaucoma     High pressure- ?pre glaucoma  . Cancer [facility])     ENDOMETRIAL CANCER  . Radiation 06/01/13-07/07/13    45 gray to pelvis and periaortic region  . History of brachytherapy 07/13/13, 07/20/13, 07/27/13    vaginal cuff boost 18 gray   Past Surgical History:  Past Surgical History  Procedure Laterality Date  . Cholecystectomy    . Appendectomy    . Abdominal hysterectomy N/A 03/01/2013    Procedure: HYSTERECTOMY ABDOMINAL TOTAL/BILATERAL SALPINGO OOPHORECTOMY/WITH PELVIC AND PARA AORTIC LYMPHADNECTOMY ;  Surgeon: Alvino Chapel, MD;  Location: [facility];  Service: Gynecology;  Laterality: N/A;  . Salpingoophorectomy Bilateral 03/01/2013    Procedure: BILATERAL SALPINGO OOPHORECTOMY;  Surgeon: Alvino Chapel, MD;  Location: [facility];  Service: Gynecology;  Laterality: Bilateral;   HPI:  Ebony Herring is a 82 y.o. female with medical history significant for Endometrial cancer status post resection January 2015 and chemoradiation ending August 2015 who is being admitted for dehydration, dizziness and presyncope.    Assessment / Plan / Recommendation Clinical Impression  Pt without overt indication of airway compromise with po - odynophagia reported.   However given aspiration episode yesterday with chocolate requiring NTS, would recommend to proceed with MBS.  Pt's cough is weak and nonproductive and she sounds congested- raising concern for aspiration tolerance.  Note CXR negative yesterday and pt has a large hiatal hernia.  Willl proceed with instrumental swallow evaluation- pt, spouse, son and RN made aware..         Aspiration Risk  Severe  aspiration risk    Diet Recommendation Ice chips PRN after oral care   Medication Administration: Via alternative means    Other  Recommendations Oral Care Recommendations: Oral care BID   Follow up Recommendations    tbd   Frequency and Duration            Prognosis Prognosis for Safe Diet Advancement: Guarded      Swallow Study   General Date of Onset: 07/31/15 HPI: Ebony Herring is a 82 y.o. female with medical history significant for Endometrial cancer status post resection January 2015 and chemoradiation ending August 2015 who is being admitted for dehydration, dizziness and presyncope.  Type of Study: Bedside Swallow Evaluation Diet Prior to this Study: NPO Temperature Spikes Noted: Yes Respiratory Status: Nasal cannula Behavior/Cognition: Alert;Cooperative;Pleasant mood Oral Cavity Assessment: Dried secretions Oral Care Completed by SLP: Yes Oral Cavity - Dentition: Adequate natural dentition Vision: Functional for self-feeding Self-Feeding Abilities: Able to feed self Patient Positioning: Upright in bed Baseline Vocal Quality: Hoarse Volitional Cough: Weak Volitional Swallow: Able to elicit    Oral/Motor/Sensory Function Overall Oral Motor/Sensory Function: Moderate impairment (generalized weakness) Velum:  (? mild deviation to left upon phonation)   Ice Chips Ice chips: Impaired Presentation: Spoon Oral Phase Functional Implications: Prolonged oral transit Pharyngeal Phase Impairments: Suspected delayed Swallow Other Comments: audible swallow   Thin Liquid Thin Liquid: Not tested    Nectar Thick Nectar Thick Liquid: Not tested   Honey Thick Honey Thick Liquid: Not tested   Puree Puree: Impaired Presentation: Self Fed;Spoon Oral Phase Impairments: Reduced lingual movement/coordination Oral Phase Functional Implications: Prolonged  oral transit Pharyngeal Phase Impairments: Other (comments) (sensation of mild residuals in pharynx- pt also complains of  secretions)   Solid   GO   Solid: Not tested        Claudie Fisherman, [facility] SLP [phone]

## 2015-08-01 ENCOUNTER — Telehealth: Payer: Self-pay | Admitting: Family Medicine

## 2015-08-01 ENCOUNTER — Inpatient Hospital Stay (HOSPITAL_COMMUNITY): Payer: Medicare Other

## 2015-08-01 DIAGNOSIS — Z7189 Other specified counseling: Secondary | ICD-10-CM

## 2015-08-01 DIAGNOSIS — J690 Pneumonitis due to inhalation of food and vomit: Secondary | ICD-10-CM

## 2015-08-01 DIAGNOSIS — R0600 Dyspnea, unspecified: Secondary | ICD-10-CM

## 2015-08-01 DIAGNOSIS — Z515 Encounter for palliative care: Secondary | ICD-10-CM

## 2015-08-01 LAB — CBC
HEMATOCRIT: 28.4 % — AB (ref 36.0–46.0)
Hemoglobin: 9 g/dL — ABNORMAL LOW (ref 12.0–15.0)
MCH: 30 pg (ref 26.0–34.0)
MCHC: 31.7 g/dL (ref 30.0–36.0)
MCV: 94.7 fL (ref 78.0–100.0)
Platelets: 207 10*3/uL (ref 150–400)
RBC: 3 MIL/uL — ABNORMAL LOW (ref 3.87–5.11)
RDW: 15.8 % — AB (ref 11.5–15.5)
WBC: 15.7 10*3/uL — ABNORMAL HIGH (ref 4.0–10.5)

## 2015-08-01 LAB — BASIC METABOLIC PANEL
Anion gap: 11 (ref 5–15)
BUN: 46 mg/dL — ABNORMAL HIGH (ref 6–20)
CALCIUM: 7.9 mg/dL — AB (ref 8.9–10.3)
CHLORIDE: 116 mmol/L — AB (ref 101–111)
CO2: 14 mmol/L — AB (ref 22–32)
CREATININE: 1.77 mg/dL — AB (ref 0.44–1.00)
GFR calc Af Amer: 30 mL/min — ABNORMAL LOW (ref >60)
GFR calc non Af Amer: 26 mL/min — ABNORMAL LOW (ref >60)
GLUCOSE: 140 mg/dL — AB (ref 65–99)
Potassium: 4 mmol/L (ref 3.5–5.1)
Sodium: 141 mmol/L (ref 135–145)

## 2015-08-01 MED ORDER — SODIUM CHLORIDE 0.9 % IV SOLN
3.0000 g | Freq: Two times a day (BID) | INTRAVENOUS | Status: DC
  Administered 2015-08-01 – 2015-08-03 (×6): 3 g via INTRAVENOUS
  Filled 2015-08-01 (×7): qty 3

## 2015-08-01 MED ORDER — MORPHINE SULFATE (PF) 2 MG/ML IV SOLN: 1.0000 mg | INTRAVENOUS | Status: DC | PRN

## 2015-08-01 NOTE — Telephone Encounter (Signed)
Dr. Murray Hodgkins would like to speak with Dr. Buelah Manis regarding pt's care at her convenience. Please call [phone]

## 2015-08-01 NOTE — Progress Notes (Signed)
Pharmacy Antibiotic Note  Ebony Herring is a 82 y.o. female admitted on 07/28/2015 with weakness and syncope; PMH of endometrial cancer s/p resection, chemo, radiation.  Now, concern for aspiration pneumonia.  Pharmacy has been consulted for Unasyn dosing.  Plan:  Unasyn 3g IV q12h  Follow up renal fxn, culture results, and clinical course.  Height: 5\' 4"  (162.6 cm) Weight: 124 lb 14.4 oz (56.654 kg) IBW/kg (Calculated) : 54.7  Temp (24hrs), Avg:97.9 F (36.6 C), Min:97.5 F (36.4 C), Max:98.6 F (37 C)   Recent Labs Lab 07/28/15 1154 07/29/15 0300 07/30/15 0513 07/31/15 0510 07/31/15 1600 08/01/15 0420  WBC 10.9* 9.3 10.8* 14.9*  --  15.7*  CREATININE 1.41* 1.30* 1.17* 1.83* 1.86* 1.77*    Estimated Creatinine Clearance: 21.5 mL/min (by C-G formula based on Cr of 1.77).    Allergies  Allergen Reactions  . Bactrim [Sulfamethoxazole-Trimethoprim] Other (See Comments)    Made her see things  . Ciprofloxacin Other (See Comments)    Made her see things    Antimicrobials this admission: 6/21 Unasyn >>   Dose adjustments this admission:  Microbiology results: 6/17 UCx: NGF   Thank you for allowing pharmacy to be a part of this patient's care.  Gretta Arab PharmD, BCPS Pager [phone] 08/01/2015 11:20 AM

## 2015-08-01 NOTE — Telephone Encounter (Signed)
Discussed, reviewed chart with Dr. Sarajane Jews, I appreciate the excellent care given to Mrs. Swindell

## 2015-08-01 NOTE — Progress Notes (Signed)
PT Cancellation Note  Patient Details Name: RIANN CREPPEL MRN: [id number] DOB: [birth year]   Cancelled Treatment:    Reason Eval/Treat Not Completed: Medical issues which prohibited therapy (wosening condition, possible  Palliative care consult. will check back another time.)   Claretha Cooper 08/01/2015, 4:11 PM Tresa Endo PT [phone]

## 2015-08-01 NOTE — Consult Note (Signed)
Consultation Note Date: 08/01/2015   Patient Name: Ebony Herring  DOB: 03-Sep-1931  MRN: [id number]  Age / Sex: 82 y.o., female  PCP: Alycia Rossetti, MD Referring Physician: Samuella Cota, MD  Reason for Consultation: Disposition and Establishing goals of care  HPI/Patient Profile: 82 y.o. female  with past medical history of endometrial cancer s/p hysterectomy with chemo and radiation who was admitted on 07/28/2015 with dehydration, dizziness and presyncope. She had experienced approximately 5 weeks of cough with loss of appetite and 10 lb weight loss prior to admission. Patient with AKI secondary to poor PO intake and dehydration. Workup during this hospital stay revealed profound dysphagia and aspiration with any PO.  Further CT scan revealed undefined lesions on her liver, and her TSH was elevated at 9.6.  Patient has been experiencing intermittent delirium.  Clinical Assessment and Goals of Care: Spoke with patient's husband, sister and son. Patient is a 82 year old female who had been active and healthy since her endometrial cancer.  She was in her usual state of health until  5 weeks prior to admission. He decline has been shocking to her family.  As of yet there is no clear medical reason for her profound dysphagia, but there is significant concern that it will not improve. Family appreciates Dr. Everett Graff clinical expertise and would feel much better making decisions about Mrs. Sparling's goals of care if they had a definitive reason for her dysphagia.  They do understand that if she is unable to eat or drink her prognosis is only weeks.  They do not want to consider a feeding tube unless the dysphagia is reversible.  She is very close to her family and has wonderful support. She is a [place] and family welcomes a visit from the [place]. Family agrees that although she would enjoy being home, it would be  best for her care and comfort to go to residential hospice if her dysphagia is irreversible.   NEXT OF KIN:  Husband and son.    SUMMARY OF RECOMMENDATIONS    DNR  Family appreciate's further work up regarding etiology / reversibility of dysphagia  If the dysphagia is reversible they may consider a PEG.  Otherwise they want Comfort Measures and Residential Hospice.  They are opposed to an N/G.  They are opposed to comfort feeds at this point.  Code Status/Advance Care Planning:  DNR    Symptom Management:   Per primary team   Palliative Prophylaxis:   Aspiration  Psycho-social/Spiritual:   Desire for further Chaplaincy support:YES   Additional Recommendations: Caregiving  Support/Resources  Prognosis:   < 2 weeks if patient is unable to eat or drink.  Discharge Planning: To Be Determined If condition is irreversible the family desires residential hospice.     Primary Diagnoses: Present on Admission:  . Dehydration . AKI (acute kidney injury) ([place]) . Peripheral neuropathy due to chemotherapy [facility])  I have reviewed the medical record, interviewed the patient and family, and examined the patient. The following aspects are  pertinent.  Past Medical History  Diagnosis Date  . Glaucoma     High pressure- ?pre glaucoma  . Cancer [facility])     ENDOMETRIAL CANCER  . Radiation 06/01/13-07/07/13    45 gray to pelvis and periaortic region  . History of brachytherapy 07/13/13, 07/20/13, 07/27/13    vaginal cuff boost 18 gray   Social History   Social History  . Marital Status: Married    Spouse Name: N/A  . Number of Children: 1  . Years of Education: N/A   Social History Main Topics  . Smoking status: Never Smoker   . Smokeless tobacco: Never Used  . Alcohol Use: No  . Drug Use: No  . Sexual Activity: No   Other Topics Concern  . None   Social History Narrative   Family History  Problem Relation Age of Onset  . Breast cancer Maternal Aunt     x 4    Scheduled Meds: . ampicillin-sulbactam (UNASYN) IV  3 g Intravenous Q12H   Continuous Infusions: . sodium chloride 150 mL/hr at 08/01/15 1116   PRN Meds:.albuterol, sodium chloride flush Medications Prior to Admission:  Prior to Admission medications   Medication Sig Start Date End Date Taking? Authorizing Provider  acetaminophen (TYLENOL) 500 MG tablet Take 500 mg by mouth every 6 (six) hours as needed for mild pain.   Yes Historical Provider, MD  Brinzolamide-Brimonidine [facility]) 1-0.2 % SUSP Place 1 drop into both eyes 2 (two) times daily.    Yes Historical Provider, MD  FERROCITE 324 MG TABS Take 1 tablet by mouth daily. 01/16/15  Yes Historical Provider, MD  gabapentin (NEURONTIN) 100 MG capsule Take one tablet every am in addition to the 300 mg tablet at bedtime. Patient not taking: Reported on 07/04/2015 06/15/15   Gordy Levan, MD  gabapentin (NEURONTIN) 300 MG capsule Take 1 capsule (300 mg total) by mouth at bedtime. For neuropathy. Patient not taking: Reported on 07/04/2015 04/03/15   Gordy Levan, MD   Allergies  Allergen Reactions  . Bactrim [Sulfamethoxazole-Trimethoprim] Other (See Comments)    Made her see things  . Ciprofloxacin Other (See Comments)    Made her see things   Review of Systems:  Patient unable to give   Physical Exam  Elderly chronically ill appearing female with pallor CV rrr Resp +stridor as well as anterior wheeze. Abdomen soft Extremities able to move all 4  Vital Signs: BP 107/67 mmHg  Pulse 114  Temp(Src) 98.3 F (36.8 C) (Oral)  Resp 20  Ht 5\' 4"  (1.626 m)  Wt 56.654 kg (124 lb 14.4 oz)  BMI 21.43 kg/m2  SpO2 92% Pain Assessment: No/denies pain   Pain Score: Asleep   SpO2: SpO2: 92 % O2 Device:SpO2: 92 % O2 Flow Rate: .O2 Flow Rate (L/min): 4 L/min  IO: Intake/output summary:  Intake/Output Summary (Last 24 hours) at 08/01/15 1546 Last data filed at 08/01/15 1321  Gross per 24 hour  Intake 1197.5 ml  Output     450 ml  Net  747.5 ml    LBM: Last BM Date: 07/30/15 Baseline Weight: Weight: 56.654 kg (124 lb 14.4 oz) Most recent weight: Weight: 56.654 kg (124 lb 14.4 oz)     Palliative Assessment/Data:   Flowsheet Rows        Most Recent Value   Intake Tab    Referral Department  Hospitalist   Unit at Time of Referral  Cardiac/Telemetry Unit   Palliative Care Primary Diagnosis  Other (Comment) [  FTT]   Date Notified  08/01/15   Palliative Care Type  New Palliative care   Reason for referral  Clarify Goals of Care   Date of Admission  07/28/15   # of days IP prior to Palliative referral  4   Clinical Assessment    Psychosocial & Spiritual Assessment    Palliative Care Outcomes       Time In: 2:30  Time Out: 3:40 Time Total: 70 min Greater than 50%  of this time was spent counseling and coordinating care related to the above assessment and plan.  Signed by: Quillian Quince, [facility]  Palliative Medicine Pager: [phone]    Please contact Palliative Medicine Team phone at [phone] for questions and concerns.  For individual provider: See Shea Evans

## 2015-08-01 NOTE — Evaluation (Addendum)
SLP Cancellation Note  Patient Details Name: Ebony Herring MRN: [id number] DOB: 05-Jul-1931   Cancelled treatment:       Reason Eval/Treat Not Completed: Other (comment) (per review of MD note, pt now strictly npo  - no ice chips allowed, will follow up- note palliative referral possible.  SLP concerned re: pt's swallow ability to return to functional level given progressive decline and thus comfort po may be a feasible alternative for this pt.)  Luanna Salk, [place] [facility] SLP [phone]

## 2015-08-01 NOTE — Progress Notes (Addendum)
PROGRESS NOTE  Ebony Herring [id number] DOB: [birth year] DOA: 07/28/2015 PCP: [name], MD  Brief Narrative: 14yow PMH endometrial cancer s/p resection 2015 with chemoraditation ending 8/205 admitted for dehydration, dizziness, presyncope.  Assessment/Plan: 1. Acute hypoxic respiratory failure secondary to aspiration of food and liquids. History also suggestive of aspiration at home. Oxygen requirement somewhat increased. 2. Right lower lobe aspiration pneumonia. 3. Severe oropharyngeal dysphagia. Discussed with ST, rehab potential minimal. 4. Acute encephalopathy, delirium secondary to acute issues. No history of dementia per family. 5. Acute kidney injury with associated hyperkalemia. Suspect secondary to poor oral intake and acute illness. None on any nephrotoxins. Renal function somewhat improved. Nonoliguric. 6. Elevated troponin. No further evaluation per cardiology. No evidence of ACS. Echocardiogram noted. 7. Generalized weakness, dizziness, nausea. Etiology unclear. Failure to thrive at home with weight loss. Likely multifactorial including dysphagia, possible malignancy recurrence. 8. Elevated TSH. Started on levothyroxine. 9. Stable normocytic anemia, anemia of chronic disease. 10. PMH Endometrial CA with chemo and XRT 2 years ago with peripheral neuropathy secondary to chemotherapy. On renal u/s: Abnormal appearance of the liver. Known right liver lobe hemangioma, but evidence of loose 1 at other hypo attenuating lesion. Overall heterogeneous echogenicity could be due to poorly defined additional lesions. There is also small amount ascites.Recommend follow-up CT of the abdomen pelvis with contrast for further assessment   Condition appears somewhat worse today, prognosis is guarded. Discussed in detail with husband and son at bedside. Patient is DO NOT RESUSCITATE/DO NOT INTUBATE. We discussed nothing by mouth status, strict, challenges to future nutrition, developing  pneumonia and the possibility of recurrent malignancy. Not a candidate for CT with contrast at this point therefore will hold off on imaging until goals of care clarified. Broached the topic of not surviving hospitalization and consideration of comfort care.  For now, IV antibiotics, continue oxygen, IV fluids, serial laboratory studies.  ADDENDUM 1800 Discussed in detail this PM with son, husband and multiple family members. Discussed options including more aggressive care, feeding tube, imaging. They are considering moving toward comfort care. For now, they decline further imaging or escalation of care. will continue current abx, IVF.   DVT prophylaxis: SCDs Code Status: DNR/DNI Family Communication:  Disposition Plan: pending  Murray Hodgkins, MD  [facility] Direct contact: [phone] 7PM-7AM contact night coverage as above 08/01/2015, 11:07 AM  LOS: 1 day   Consultants:  Cardiology  PT: [facility]  Procedures:  Echo Study Conclusions  - Left ventricle: The cavity size was normal. Systolic function was  normal. The estimated ejection fraction was in the range of 60%  to 65%. Wall motion was normal; there were no regional wall  motion abnormalities. Doppler parameters are consistent with  abnormal left ventricular relaxation (grade 1 diastolic  dysfunction). Doppler parameters are consistent with elevated  ventricular end-diastolic filling pressure. - Ventricular septum: The contour showed diastolic flattening  consistent with RV volume overload. - Aortic valve: There was mild regurgitation. - Mitral valve: Transvalvular velocity was within the normal range.  There was no evidence for stenosis. There was mild regurgitation  directed posteriorly. - Right ventricle: The cavity size was moderately dilated. Wall  thickness was normal. Systolic function was moderately reduced. - Right atrium: The atrium was severely dilated. - Atrial septum: No defect or patent  foramen ovale was identified  by color flow Doppler. - Tricuspid valve: There was moderate regurgitation. - Pulmonary arteries: Systolic pressure was mildly increased. PA  peak pressure: 46 mm Hg (S). - Inferior vena  cava: The vessel was dilated. The respirophasic  diameter changes were blunted (< 50%), consistent with elevated  central venous pressure.  Antimicrobials:    HPI/Subjective: Confused.  Son and husband at bedside. Son giving ice chips to patient.  Breathing okay. No complaints. Objective: Filed Vitals:   07/31/15 0601 07/31/15 1337 07/31/15 2037 08/01/15 0441  BP: 110/60 106/55 104/75 127/61  Pulse: 74 79 102 97  Temp: 98.1 F (36.7 C) 97.5 F (36.4 C) 97.7 F (36.5 C) 98.6 F (37 C)  TempSrc: Oral Oral Oral Oral  Resp: 20 20 20 20   Height:      Weight:      SpO2: 99% 98% 100% 90%    Intake/Output Summary (Last 24 hours) at 08/01/15 1107 Last data filed at 08/01/15 1106  Gross per 24 hour  Intake 1097.5 ml  Output    450 ml  Net  647.5 ml     Filed Weights   07/28/15 1959  Weight: 56.654 kg (124 lb 14.4 oz)    Exam: Constitutional:  . Appears calm and comfortable, Elderly but not toxic. ENMT:  . grossly normal hearing  Respiratory:  . Generally clear with some faint wheezing bilaterally. No rhonchi or rales. . Mild increased respiratory effort. No retractions or accessory muscle use Cardiovascular:  . RRR, no m/r/g . No LE extremity edema   . Telemetry SR Abdomen:  . Abdomen no tenderness  Psychiatric:  . judgement and insight impaired . Mental status o Disoriented. Confused.  I have personally reviewed following labs and imaging studies:  Potassium 4, CO2 14, BUN 46, creatinine 1.77.  Hemoglobin stable 9  WBC increase, 15.7  UOP 350, x1 void  Scheduled Meds:   Continuous Infusions: . sodium chloride 150 mL/hr at 08/01/15 [id number]    Principal Problem:   Acute respiratory failure with hypoxia (HCC) Active Problems:    Peripheral neuropathy due to chemotherapy (H[facility])   Dehydration   AKI (acute kidney injury) ([place])   Elevated troponin   Dizziness   Elevated TSH   Aspiration into airway   Aspiration pneumonia ([place])   LOS: 1 day   Time spent 35 minutes, Greater than 50% in counseling and coordination of care
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
## 2015-08-02 DIAGNOSIS — R1310 Dysphagia, unspecified: Secondary | ICD-10-CM

## 2015-08-02 LAB — BASIC METABOLIC PANEL
ANION GAP: 10 (ref 5–15)
BUN: 44 mg/dL — ABNORMAL HIGH (ref 6–20)
CALCIUM: 7.8 mg/dL — AB (ref 8.9–10.3)
CO2: 14 mmol/L — ABNORMAL LOW (ref 22–32)
CREATININE: 1.62 mg/dL — AB (ref 0.44–1.00)
Chloride: 116 mmol/L — ABNORMAL HIGH (ref 101–111)
GFR, EST AFRICAN AMERICAN: 33 mL/min — AB (ref >60)
GFR, EST NON AFRICAN AMERICAN: 29 mL/min — AB (ref >60)
Glucose, Bld: 129 mg/dL — ABNORMAL HIGH (ref 65–99)
Potassium: 3.4 mmol/L — ABNORMAL LOW (ref 3.5–5.1)
SODIUM: 140 mmol/L (ref 135–145)

## 2015-08-02 LAB — CBC
HCT: 26.7 % — ABNORMAL LOW (ref 36.0–46.0)
Hemoglobin: 8.4 g/dL — ABNORMAL LOW (ref 12.0–15.0)
MCH: 29.5 pg (ref 26.0–34.0)
MCHC: 31.5 g/dL (ref 30.0–36.0)
MCV: 93.7 fL (ref 78.0–100.0)
PLATELETS: 150 10*3/uL (ref 150–400)
RBC: 2.85 MIL/uL — ABNORMAL LOW (ref 3.87–5.11)
RDW: 16.4 % — AB (ref 11.5–15.5)
WBC: 16.2 10*3/uL — AB (ref 4.0–10.5)

## 2015-08-02 NOTE — Progress Notes (Signed)
PT Cancellation Note  Patient Details Name: Ebony Herring MRN: [id number] DOB: 09/12/1931   Cancelled Treatment:    Reason Eval/Treat Not Completed: Other (comment) Pt status discussed with RN.  Pt with plans to d/c to residential hospice.  PT to sign off at this time.   Nitza Schmid,KATHrine E 08/02/2015, 1:13 PM Carmelia Bake, PT, DPT 08/02/2015 Pager: [phone]

## 2015-08-02 NOTE — Progress Notes (Signed)
PROGRESS NOTE  Ebony Herring [id number] DOB: [birth year] DOA: 07/28/2015 PCP: [name], MD  Brief Narrative: 5yow PMH endometrial cancer s/p resection 2015 with chemoraditation ending 8/205 admitted for dehydration, dizziness, presyncope. Renal function briefly improved but then again worsened, developed aspiration pneumonia, swallowing evaluation revealed profound oral pharyngeal dysphagia. Started on antibiotics and continued IV fluids prognosis poor. May not survive this hospitalization. Transfer to residential hospice is being considered.  Assessment/Plan: 1. Acute hypoxic respiratory failure secondary to aspiration of food and liquids. Oxygen requirement appears stable, 4 L. 2. Right lower lobe aspiration pneumonia. 3. Severe oral pharyngeal dysphagia. Etiology unclear, however patient has a 1-2 month or even longer history of coughing, failure to thrive. Suspect insidious, chronic process. No focal neurologic deficits otherwise, no history to suggest stroke, no history of dementia. Family does not wish to pursue any further investigation. 4. Acute encephalopathy, delirium secondary to infection and other issues. No significant change today. 5. Acute kidney injury, mildly improved with fluids. CO2 remains low. 6. Generalized weakness, failure to thrive, weight loss. Slowly worsening. Multifactorial including dysphagia. Possible malignancy recurrence. 7. Elevated TSH. Not clearly symptomatic. Started on levothyroxine. 8. Elevated troponin. No evidence of ACS. Echocardiogram reassuring. No further evaluation per cardiology.  9. Stable normocytic anemia, anemia of chronic disease. 10. Endometrial cancer status post chemotherapy and radiation therapy 2015. CT abdomen and pelvis 2015 with possible rectal lesion and sclerotic right iliac wing lesion. Renal ultrasound this admission suggested possible liver lesions. Family does not wish to pursue any further investigation.   Doing  poorly, weaker, remains hypoxic. Unable to handle oral secretions. Prognosis grim and likely less than 1 week. It does not appear that any further investigation or treatment would be of significant benefit.  Discussed in detail with son, husband at bedside, reviewed current issues, known/unknown. We discussed possible etiologies for dysphagia, possible recurrence of cancer; we discussed treatment options including feeding tube, further investigation with CT abdomen and pelvis, MRI brain. They do not want to pursue any further investigation, no feeding tube. We again discussed the possibility of hospice, they are interested in residential hospice, however for today wish to continue antibiotics and fluids, repeat lab in the morning. Unless fails to improve, likely will pursue residential hospice next 48 hours.  DVT prophylaxis: SCDs Code Status: DNR/DNI Family Communication:  Disposition Plan: pending  Murray Hodgkins, MD  [facility] Direct contact: [phone] 7PM-7AM contact night coverage as above 08/02/2015, 11:31 AM  LOS: 2 days   Consultants:  Cardiology  PT: [facility]  Procedures:  Echo Study Conclusions  - Left ventricle: The cavity size was normal. Systolic function was  normal. The estimated ejection fraction was in the range of 60%  to 65%. Wall motion was normal; there were no regional wall  motion abnormalities. Doppler parameters are consistent with  abnormal left ventricular relaxation (grade 1 diastolic  dysfunction). Doppler parameters are consistent with elevated  ventricular end-diastolic filling pressure. - Ventricular septum: The contour showed diastolic flattening  consistent with RV volume overload. - Aortic valve: There was mild regurgitation. - Mitral valve: Transvalvular velocity was within the normal range.  There was no evidence for stenosis. There was mild regurgitation  directed posteriorly. - Right ventricle: The cavity size was  moderately dilated. Wall  thickness was normal. Systolic function was moderately reduced. - Right atrium: The atrium was severely dilated. - Atrial septum: No defect or patent foramen ovale was identified  by color flow Doppler. - Tricuspid valve: There was moderate regurgitation. -  Pulmonary arteries: Systolic pressure was mildly increased. PA  peak pressure: 46 mm Hg (S). - Inferior vena cava: The vessel was dilated. The respirophasic  diameter changes were blunted (< 50%), consistent with elevated  central venous pressure.  Antimicrobials:    HPI/Subjective: Patient reports some shortness of breath. No abdominal pain. No other complaints.  Husband, son at bedside. Report the patient opens eyes and speaks a bit but falls back asleep. Appears to be tired. Still confused at times.  Objective: Filed Vitals:   08/01/15 0441 08/01/15 1414 08/01/15 2044 08/02/15 0448  BP: 127/61 107/67 117/68 118/54  Pulse: 97 114 106 84  Temp: 98.6 F (37 C) 98.3 F (36.8 C) 97.5 F (36.4 C) 97.7 F (36.5 C)  TempSrc: Oral Oral Oral Oral  Resp: 20 20 22 22   Height:      Weight:      SpO2: 90% 92% 95% 92%    Intake/Output Summary (Last 24 hours) at 08/02/15 1131 Last data filed at 08/02/15 0950  Gross per 24 hour  Intake 1083.75 ml  Output      0 ml  Net 1083.75 ml     Filed Weights   07/28/15 1959  Weight: 56.654 kg (124 lb 14.4 oz)    Exam: Constitutional:  . Appears ill, fatigued. Opens eyes briefly and answer simple questions but somewhat confused. ENMT:  . grossly normal hearing  . Audible secretions Respiratory:  . Some rhonchi on the right side, fair air movement. No rales. Some upper airway noise and wheezes. . Mild increased respiratory effort. No retractions or accessory muscle use Cardiovascular:  . RRR, no m/r/g . No LE extremity edema   Abdomen:  . Abdomen appears normal; no tenderness or masses . No hernias noted Musculoskeletal:  o Moves both legs to  command Psychiatric:  . judgement and insight impaired, confused at times, verbal responses not consistently appropriate . Mental status o Confused  I have personally reviewed following labs and imaging studies:  Urine output 100 + void  Potassium 3.4, CO2 14, BUN 44, creatinine 1.62  WBC 16.2, increased  Hemoglobin stable 8.4  Scheduled Meds: . ampicillin-sulbactam (UNASYN) IV  3 g Intravenous Q12H   Continuous Infusions: . sodium chloride 75 mL/hr at 08/02/15 [id number]    Principal Problem:   Acute respiratory failure with hypoxia (HCC) Active Problems:   Peripheral neuropathy due to chemotherapy (HCC)   Dehydration   AKI (acute kidney injury) ([place])   Elevated troponin   Dizziness   Elevated TSH   Aspiration into airway   Aspiration pneumonia ([place])   Dyspnea   Palliative care encounter   Goals of care, counseling/discussion   Encounter for hospice care discussion   LOS: 2 days   Time spent 35 minutes, Greater than 50% in counseling and coordination of care

## 2015-08-02 NOTE — Progress Notes (Signed)
SLP Cancellation Note  Patient Details Name: Ebony Herring MRN: [id number] DOB: 07-17-1931   Cancelled treatment:       Reason Eval/Treat Not Completed: Other (comment) (pt for hospice care per chart review, will sign off)   Luanna Salk, [facility] SLP [phone]

## 2015-08-02 NOTE — Progress Notes (Signed)
No charge note.  Profound aspiration of uncertain etiology but felt to be a process that has developed over several months and is now irreversible.  Son ready for residential hospice.  Husband still struggling.   Dr. Sarajane Jews to reassess on 6/23 am and will likely put in the social work order for residential hospice.  Patient's swallow function is not "rehabable".  Patient is not appropriate for SNF.  Only appropriate for residential hospice on D/C.  Family unable to handle her symptoms at home even with the support of hospice.  Imogene Burn, [facility] Pager: [phone]

## 2015-08-02 NOTE — Progress Notes (Signed)
[id number]  [facility] RN Liason Note   Received request from CSW for family interest in [facility].   Met with spouse and son to explain services and confirm interest.  Family did confirm interest when bed does become available.   Chart is currently being reviewed for eligibility.   At this time however, there is not a bed available.    [facility] will follow up again tomorrow to determine availability.  CSW aware.     Thank you for the referral.  Will continue to follow.   Susan Edom Schmuhl, RN  [facility]  [phone]  

## 2015-08-03 DIAGNOSIS — R1312 Dysphagia, oropharyngeal phase: Secondary | ICD-10-CM

## 2015-08-03 LAB — BASIC METABOLIC PANEL
ANION GAP: 10 (ref 5–15)
BUN: 41 mg/dL — ABNORMAL HIGH (ref 6–20)
CHLORIDE: 115 mmol/L — AB (ref 101–111)
CO2: 17 mmol/L — AB (ref 22–32)
Calcium: 8 mg/dL — ABNORMAL LOW (ref 8.9–10.3)
Creatinine, Ser: 1.39 mg/dL — ABNORMAL HIGH (ref 0.44–1.00)
GFR calc non Af Amer: 35 mL/min — ABNORMAL LOW (ref >60)
GFR, EST AFRICAN AMERICAN: 40 mL/min — AB (ref >60)
Glucose, Bld: 121 mg/dL — ABNORMAL HIGH (ref 65–99)
POTASSIUM: 3.2 mmol/L — AB (ref 3.5–5.1)
Sodium: 142 mmol/L (ref 135–145)

## 2015-08-03 LAB — CBC
HEMATOCRIT: 26.2 % — AB (ref 36.0–46.0)
HEMOGLOBIN: 8.4 g/dL — AB (ref 12.0–15.0)
MCH: 29.9 pg (ref 26.0–34.0)
MCHC: 32.1 g/dL (ref 30.0–36.0)
MCV: 93.2 fL (ref 78.0–100.0)
Platelets: 147 10*3/uL — ABNORMAL LOW (ref 150–400)
RBC: 2.81 MIL/uL — AB (ref 3.87–5.11)
RDW: 16.7 % — ABNORMAL HIGH (ref 11.5–15.5)
WBC: 15.8 10*3/uL — ABNORMAL HIGH (ref 4.0–10.5)

## 2015-08-03 MED ORDER — STERILE WATER FOR INJECTION IV SOLN
INTRAVENOUS | Status: DC
  Administered 2015-08-03: 09:00:00 via INTRAVENOUS
  Filled 2015-08-03 (×3): qty 925

## 2015-08-03 MED ORDER — POTASSIUM CHLORIDE 10 MEQ/100ML IV SOLN
10.0000 meq | INTRAVENOUS | Status: AC
  Administered 2015-08-03 (×4): 10 meq via INTRAVENOUS
  Filled 2015-08-03 (×3): qty 100

## 2015-08-03 MED ORDER — STERILE WATER FOR INJECTION IV SOLN
INTRAVENOUS | Status: DC
  Filled 2015-08-03: qty 1000

## 2015-08-03 MED ORDER — STERILE WATER FOR INJECTION IV SOLN: INTRAVENOUS | Status: DC

## 2015-08-03 NOTE — Discharge Summary (Addendum)
Physician Discharge Summary  Ebony Herring [id number] DOB: 1931-08-21 DOA: 07/28/2015  PCP: [name], MD  Admit date: 07/28/2015 Discharge date: 08/04/2015  Recommendations for Outpatient Follow-up:  1. Discharged to residential hospice   Discharge Diagnoses:  1. Acute hypoxic respiratory failure secondary to aspiration 2. Right lower lobe aspiration pneumonia secondary to dysphagia 3. Severe oropharyngeal dysphagia 4. Acute encephalopathy, delirium 5. Acute kidney injury with metabolic acidosis 6. Generalized weakness, failure to thrive, weight loss 7. Elevated troponin 8. Anemia of chronic disease 9. Endometrial cancer 10. Possible liver lesions of unclear significance  Discharge Condition: poor, life expectancy 1 week Disposition: residential hospice  Diet recommendation: ice chips; comfort feeds could be administered if patient desires and family present  [facility] Weights   07/28/15 1959  Weight: 56.654 kg (124 lb 14.4 oz)    History of present illness:  82yow PMH endometrial cancer s/p resection 2015 with chemoradiation ending 09/2013 admitted for dehydration, dizziness, presyncope.   Hospital Course:  Renal function briefly improved with IVF but then again worsened. Developed aspiration pneumonia after becoming choked on candy and water. Further history suggested ongoing, subacute dysphagia. Swallowing evaluation revealed profound oropharyngeal dysphagia with no safe diet available. Treated with IV antibiotics and fluids with improvement in renal function and stabilization of respiratory status although remains with significant oxygen requirement. Patient has become gradually weaker and prognosis is poor. Discussed treatment options with family members including further imaging, investigation, feeding tube and aggressive care. Profound oropharyngeal dysphagia per speech therapy not felt to be likely to improve. Also discussed concern for recurrent malignancy,  limitations of feeding tube, and inability to prevent aspiration even when nothing by mouth. Patient DO NOT RESUSCITATE/DO NOT INTUBATE. Family declined further imaging, investigation and feeding tube. Decision made to treat with antibiotics and IV fluids but given failure to improve, plan is to discontinue both 6/24 and transfer to residential hospice.  1. Acute hypoxic respiratory failure secondary to aspiration of food and liquids. Oxygen requirement remained stable. 2. Right lower lobe aspiration pneumonia secondary to food/fluid. WBC elevated but stable. Oxygen requirement stable. Currently on antibiotics. 3. Severe oral pharyngeal dysphasia, etiology unclear, however history suggestive of subacute process over the last few months. Insidious onset. No focal neurologic deficits and no history to suggest stroke, no history of dementia. Offered further investigation with imaging and treatment which family declined. Family declines feeding tube. 4. Acute encephalopathy/delirium secondary to acute illness. 5. Acute kidney injury with metabolic acidosis. Gradually improving with IV fluids. 6. Generalized weakness, failure to thrive, weight loss. Multifactorial including dysphagia. Concern for recurrent malignancy. 7. Elevated TSH, not clearly symptomatic.  8. Elevated troponin, no evidence of ACS. Echocardiogram reassuring. Cardiology recommended no further evaluation. 9. Stable normocytic anemia, anemia of chronic disease. 10. Endometrial cancer status post chemotherapy and radiation therapy 2015. CT abdomen and pelvis 2015 with possible rectal lesion and sclerotic right iliac wing lesion. Renal ultrasound this admission suggested possible liver lesions. Clinical picture worrisome for recurrent malignancy. Discussed in detail with family who declined further imaging or investigation. Clearly not a candidate for any treatment.  Consultants:  Cardiology  PMT  PT: [facility]  Procedures:  Echo Study  Conclusions  - Left ventricle: The cavity size was normal. Systolic function was  normal. The estimated ejection fraction was in the range of 60%  to 65%. Wall motion was normal; there were no regional wall  motion abnormalities. Doppler parameters are consistent with  abnormal left ventricular relaxation (grade 1 diastolic  dysfunction). Doppler  parameters are consistent with elevated  ventricular end-diastolic filling pressure. - Ventricular septum: The contour showed diastolic flattening  consistent with RV volume overload. - Aortic valve: There was mild regurgitation. - Mitral valve: Transvalvular velocity was within the normal range.  There was no evidence for stenosis. There was mild regurgitation  directed posteriorly. - Right ventricle: The cavity size was moderately dilated. Wall  thickness was normal. Systolic function was moderately reduced. - Right atrium: The atrium was severely dilated. - Atrial septum: No defect or patent foramen ovale was identified  by color flow Doppler. - Tricuspid valve: There was moderate regurgitation. - Pulmonary arteries: Systolic pressure was mildly increased. PA  peak pressure: 46 mm Hg (S). - Inferior vena cava: The vessel was dilated. The respirophasic  diameter changes were blunted (< 50%), consistent with elevated  central venous pressure.  Antimicrobials:  Unaysn 6/21 >> 6/23  Discharge Instructions   Current Discharge Medication List    CONTINUE these medications which have NOT CHANGED   Details  Brinzolamide-Brimonidine (SIMBRINZA) 1-0.2 % SUSP Place 1 drop into both eyes 2 (two) times daily.       STOP taking these medications     acetaminophen (TYLENOL) 500 MG tablet      FERROCITE 324 MG TABS      gabapentin (NEURONTIN) 100 MG capsule      gabapentin (NEURONTIN) 300 MG capsule        Allergies  Allergen Reactions  . Bactrim [Sulfamethoxazole-Trimethoprim] Other (See Comments)    Made her see  things  . Ciprofloxacin Other (See Comments)    Made her see things    The results of significant diagnostics from this hospitalization (including imaging, microbiology, ancillary and laboratory) are listed below for reference.    Significant Diagnostic Studies: Dg Chest 2 View  07/28/2015  CLINICAL DATA:  [name]. EXAM: CHEST  2 VIEW COMPARISON:  Chest CT 11/14/2013; chest radiograph 02/25/2013. FINDINGS: Right anterior chest wall Port-A-Cath with tip projecting over the superior vena cava. Large hiatal hernia. Stable cardiac and mediastinal contours. No consolidative pulmonary opacities. No pleural effusion or pneumothorax. Cholecystectomy clips. Osseous skeleton is unremarkable. IMPRESSION: No active cardiopulmonary disease. Electronically Signed   By: Lovey Newcomer M.D.   On: 07/28/2015 11:52   Ct Head Wo Contrast  07/28/2015  CLINICAL DATA:  Hx of endometrial ca and is currently having dizziness that brought her to the ED EXAM: CT HEAD WITHOUT CONTRAST TECHNIQUE: Contiguous axial images were obtained from the base of the skull through the vertex without intravenous contrast. COMPARISON:  None. FINDINGS: The ventricles are normal in configuration. There is ventricular and sulcal enlargement reflecting mild atrophy. No hydrocephalus. There are no parenchymal masses or mass effect. There is no evidence of an infarct. Mild periventricular white matter hypoattenuation is noted consistent with chronic microvascular ischemic change. There are no extra-axial masses or abnormal fluid collections. There is no intracranial hemorrhage. There is dependent fluid in the left maxillary sinus. There is a posterior mucous retention cysts left sphenoid sinus. Remaining visualized sinuses are clear as are the mastoid air cells. No skull fracture. IMPRESSION: 1. No acute intracranial abnormalities.  No skull fracture. 2. Air-fluid level in the left maxillary sinus. This is nonspecific. Consider acute  sinusitis in the proper clinical setting. Electronically Signed   By: Lajean Manes M.D.   On: 07/28/2015 17:52   US Renal  07/29/2015  CLINICAL DATA:  Acute kidney injury. History of endometrial carcinoma. EXAM: RENAL / URINARY TRACT ULTRASOUND  COMPLETE COMPARISON:  CT, 11/14/2013 FINDINGS: Right Kidney: Length: 9.2 cm. Renal cortical thinning. Normal parenchymal echogenicity. No mass, stone or hydronephrosis. Left Kidney: Length: 8.4 cm. Renal cortical thinning. Normal parenchymal echogenicity. No mass, stone or hydronephrosis. Bladder: Appears normal for degree of bladder distention. There is a hyperechoic right lobe lesion consistent with 1 of the hemangiomas noted on the prior CT. However, the liver also shows heterogeneous echogenicity and a possible hypoechoic mass from the inferior right lobe measuring 4.6 cm. This has no correlate on the prior CT. There is also small amount ascites. IMPRESSION: 1. Bilateral renal cortical thinning. No renal masses, stones or hydronephrosis. 2. Abnormal appearance of the liver. Known right liver lobe hemangioma, but evidence of loose 1 at other hypo attenuating lesion. Overall heterogeneous echogenicity could be due to poorly defined additional lesions. There is also small amount ascites. Recommend follow-up CT of the abdomen pelvis with contrast for further assessment. Electronically Signed   By: Lajean Manes M.D.   On: 07/29/2015 14:22   Dg Chest Port 1 View  08/01/2015  CLINICAL DATA:  Aspiration. EXAM: PORTABLE CHEST 1 VIEW COMPARISON:  07/30/2015. FINDINGS: PowerPort catheter noted in stable position. Heart size stable. Mild right base infiltrate cannot be excluded. No pleural effusion or pneumothorax. IMPRESSION: 1. PowerPort catheter stable position. 2.  Mild right base infiltrate cannot be excluded on today's exam. Electronically Signed   By: Marcello Moores  Register   On: 08/01/2015 07:32   Dg Chest Port 1 View  07/30/2015  CLINICAL DATA:  Hypoxia EXAM: PORTABLE  CHEST 1 VIEW COMPARISON:  07/28/2015 FINDINGS: Right Port-A-Cath tip in the SVC. Heart is normal size. Large hiatal hernia, stable. Lungs are clear. No effusions. No acute bony abnormality. IMPRESSION: Large hiatal hernia.  No active disease. Electronically Signed   By: Rolm Baptise M.D.   On: 07/30/2015 13:21   Dg Swallowing Func-speech Pathology  07/31/2015  Objective Swallowing Evaluation: Type of Study: MBS-Modified Barium Swallow Study Patient Details Name: BRANDII RIESER MRN: [id number] Date of Birth: 1931-05-17 Today's Date: 07/31/2015 Time: SLP Start Time (ACUTE ONLY): 1346-SLP Stop Time (ACUTE ONLY): 1415 SLP Time Calculation (min) (ACUTE ONLY): 29 min Past Medical History: Past Medical History Diagnosis Date . Glaucoma    High pressure- ?pre glaucoma . Cancer [facility])    ENDOMETRIAL CANCER . Radiation 06/01/13-07/07/13   45 gray to pelvis and periaortic region . History of brachytherapy 07/13/13, 07/20/13, 07/27/13   vaginal cuff boost 18 gray Past Surgical History: Past Surgical History Procedure Laterality Date . Cholecystectomy   . Appendectomy   . Abdominal hysterectomy N/A 03/01/2013   Procedure: HYSTERECTOMY ABDOMINAL TOTAL/BILATERAL SALPINGO OOPHORECTOMY/WITH PELVIC AND PARA AORTIC LYMPHADNECTOMY ;  Surgeon: Alvino Chapel, MD;  Location: [facility] ORS;  Service: Gynecology;  Laterality: N/A; . Salpingoophorectomy Bilateral 03/01/2013   Procedure: BILATERAL SALPINGO OOPHORECTOMY;  Surgeon: Alvino Chapel, MD;  Location: [facility] ORS;  Service: Gynecology;  Laterality: Bilateral; HPI: DIXI DIFELICE is a 82 y.o. female with medical history significant for Endometrial cancer status post resection January 2015 and chemoradiation ending August 2015 who is being admitted for dehydration, dizziness and presyncope.  Subjective: pt awake in bed Assessment / Plan / Recommendation CHL IP CLINICAL IMPRESSIONS 07/31/2015 Therapy Diagnosis Severe pharyngeal phase dysphagia Clinical Impression Pt presents with severe  oropharyngeal and cervical esophageal dysphagia with sensorimotor deficits - pt is grossly weak.  Pt accepted only small amounts of thin, nectar and pudding during testing.  Delayed oral transiting with lingual pumping present due  to dis-coordination, ? Cognitive deficits.  Pt pharyngeal swallow was weak resulting in gross residuals across all consistencies tested without pt awareness/sensation.  She had poor UES relaxation contributing to gross residuals mixed with secretions.  Approximately only 40% of bolus enter into esophagus even with multiple dry swallows unforunately.  Cued cough and attempt at expectoration was too weak to be clear.    Pt is at aspiration risk with ANY PO intake and pt reports 10 pound weight loss in 5 weeks and chronic cough x4 weeks - causing SLP to question some baseline dysphagia.     Son also reports pt with decreased mobility recently.   Given results of testing and pt/family stated premorbid status, recommend to consider palliative consult to help establish goals.  Using teach back, educated pt and family =  Son inquired if a throat MD would be indicated - advised him to speak to MD re: this question. SLP questions pt's ability to understand information reviewed as she reported she was an "experiment".   SLP will follow up.   Impact on safety and function Risk for inadequate nutrition/hydration;Severe aspiration risk   CHL IP TREATMENT RECOMMENDATION 07/31/2015 Treatment Recommendations Therapy as outlined in treatment plan below   Prognosis 07/31/2015 Prognosis for Safe Diet Advancement Fair Barriers to Reach Goals Severity of deficits;Cognitive deficits Barriers/Prognosis Comment -- CHL IP DIET RECOMMENDATION 07/31/2015 SLP Diet Recommendations NPO;Ice chips PRN after oral care Liquid Administration via -- Medication Administration Via alternative means Compensations Minimize environmental distractions;Slow rate;Small sips/bites Postural Changes --   CHL IP OTHER RECOMMENDATIONS  07/31/2015 Recommended Consults -- Oral Care Recommendations Oral care QID Other Recommendations --   CHL IP FOLLOW UP RECOMMENDATIONS 07/31/2015 Follow up Recommendations Other (comment)   CHL IP FREQUENCY AND DURATION 07/31/2015 Speech Therapy Frequency (ACUTE ONLY) min 2x/week Treatment Duration 1 week      CHL IP ORAL PHASE 07/31/2015 Oral Phase Impaired Oral - Pudding Teaspoon -- Oral - Pudding Cup -- Oral - Honey Teaspoon -- Oral - Honey Cup -- Oral - Nectar Teaspoon -- Oral - Nectar Cup Weak lingual manipulation;Lingual pumping;Reduced posterior propulsion Oral - Nectar Straw -- Oral - Thin Teaspoon Weak lingual manipulation;Lingual pumping;Reduced posterior propulsion;Lingual/palatal residue Oral - Thin Cup Weak lingual manipulation;Lingual pumping;Reduced posterior propulsion;Lingual/palatal residue Oral - Thin Straw Weak lingual manipulation;Lingual pumping;Reduced posterior propulsion;Lingual/palatal residue Oral - Puree Weak lingual manipulation;Lingual pumping;Reduced posterior propulsion Oral - Mech Soft -- Oral - Regular -- Oral - Multi-Consistency -- Oral - Pill -- Oral Phase - Comment --  CHL IP PHARYNGEAL PHASE 07/31/2015 Pharyngeal Phase Impaired Pharyngeal- Pudding Teaspoon -- Pharyngeal -- Pharyngeal- Pudding Cup -- Pharyngeal -- Pharyngeal- Honey Teaspoon -- Pharyngeal -- Pharyngeal- Honey Cup -- Pharyngeal -- Pharyngeal- Nectar Teaspoon -- Pharyngeal -- Pharyngeal- Nectar Cup Reduced pharyngeal peristalsis;Reduced epiglottic inversion;Pharyngeal residue - valleculae;Pharyngeal residue - pyriform;Inter-arytenoid space residue;Pharyngeal residue - cp segment;Lateral channel residue Pharyngeal -- Pharyngeal- Nectar Straw -- Pharyngeal -- Pharyngeal- Thin Teaspoon Reduced pharyngeal peristalsis;Reduced epiglottic inversion;Pharyngeal residue - valleculae;Pharyngeal residue - pyriform;Pharyngeal residue - cp segment;Inter-arytenoid space residue;Lateral channel residue Pharyngeal -- Pharyngeal- Thin Cup  Reduced pharyngeal peristalsis;Reduced epiglottic inversion;Pharyngeal residue - pyriform;Pharyngeal residue - valleculae;Pharyngeal residue - cp segment;Inter-arytenoid space residue;Lateral channel residue Pharyngeal -- Pharyngeal- Thin Straw Reduced pharyngeal peristalsis;Reduced epiglottic inversion;Pharyngeal residue - valleculae;Pharyngeal residue - pyriform;Penetration/Aspiration during swallow;Penetration/Apiration after swallow;Pharyngeal residue - cp segment Pharyngeal Material enters airway, remains ABOVE vocal cords and not ejected out;Material enters airway, CONTACTS cords and not ejected out Pharyngeal- Puree Reduced pharyngeal peristalsis;Reduced epiglottic inversion;Pharyngeal residue -  valleculae;Pharyngeal residue - pyriform;Reduced tongue base retraction;Reduced anterior laryngeal mobility;Reduced laryngeal elevation;Reduced airway/laryngeal closure;Pharyngeal residue - cp segment Pharyngeal -- Pharyngeal- Mechanical Soft -- Pharyngeal -- Pharyngeal- Regular -- Pharyngeal -- Pharyngeal- Multi-consistency -- Pharyngeal -- Pharyngeal- Pill -- Pharyngeal -- Pharyngeal Comment cued dry swallows, expectoration or cough not helpful  CHL IP CERVICAL ESOPHAGEAL PHASE 07/31/2015 Cervical Esophageal Phase Impaired Pudding Teaspoon -- Pudding Cup -- Honey Teaspoon -- Honey Cup -- Nectar Teaspoon -- Nectar Cup Reduced cricopharyngeal relaxation Nectar Straw -- Thin Teaspoon Reduced cricopharyngeal relaxation Thin Cup Reduced cricopharyngeal relaxation Thin Straw Reduced cricopharyngeal relaxation Puree Reduced cricopharyngeal relaxation Mechanical Soft -- Regular -- Multi-consistency -- Pill -- Cervical Esophageal Comment pt without awareness to residuals Luanna Salk, MS [facility] SLP [phone]               Microbiology: Recent Results (from the past 240 hour(s))  Urine culture     Status: None   Collection Time: 07/28/15  1:15 PM  Result Value Ref Range Status   Specimen Description URINE, RANDOM  Final     Special Requests NONE  Final   Culture NO GROWTH Performed at [facility]   Final   Report Status 07/30/2015 FINAL  Final     Labs: Basic Metabolic Panel:  Recent Labs Lab 07/29/15 1105  07/31/15 0510 07/31/15 1600 08/01/15 0420 08/02/15 0435 08/03/15 0510  NA  --   < > 140 142 141 140 142  K  --   < > 5.7* 4.6 4.0 3.4* 3.2*  CL  --   < > 113* 116* 116* 116* 115*  CO2  --   < > 14* 15* 14* 14* 17*  GLUCOSE  --   < > 159* 136* 140* 129* 121*  BUN  --   < > 36* 42* 46* 44* 41*  CREATININE  --   < > 1.83* 1.86* 1.77* 1.62* 1.39*  CALCIUM  --   < > 8.1* 8.0* 7.9* 7.8* 8.0*  MG 1.3*  --  2.1  --   --   --   --   PHOS  --   --  6.1*  --   --   --   --   < > = values in this interval not displayed.  CBC:  Recent Labs Lab 07/28/15 1154  07/30/15 0513 07/31/15 0510 08/01/15 0420 08/02/15 0435 08/03/15 0510  WBC 10.9*  < > 10.8* 14.9* 15.7* 16.2* 15.8*  NEUTROABS 8.6*  --   --   --   --   --   --   HGB 9.8*  < > 8.4* 8.7* 9.0* 8.4* 8.4*  HCT 30.9*  < > 26.5* 27.6* 28.4* 26.7* 26.2*  MCV 93.1  < > 93.6 95.5 94.7 93.7 93.2  PLT 291  < > 204 210 207 150 147*  < > = values in this interval not displayed. Cardiac Enzymes:  Recent Labs Lab 07/28/15 2050 07/29/15 0300 07/29/15 0825  TROPONINI 0.92* 1.06* 0.93*    Principal Problem:   Acute respiratory failure with hypoxia (HCC) Active Problems:   Peripheral neuropathy due to chemotherapy (HCC)   Dehydration   AKI (acute kidney injury) (HCC)   Elevated troponin   Dizziness   Elevated TSH   Aspiration into airway   Aspiration pneumonia ([place])   Dyspnea   Palliative care encounter   Goals of care, counseling/discussion   Encounter for hospice care discussion   Time coordinating discharge: 25 minutes  Signed:  Murray Hodgkins, MD [facility] 08/03/2015, 6:02 PM

## 2015-08-03 NOTE — Consult Note (Signed)
[facility] Liaison: [employer] available for Ebony Herring Saturday, June 24th. Family requesting to be present when [facility] is called and prefer for patient to transfer between 1 and 2 pm. Spouse and son completed paper work this afternoon for transfer tomorrow. Dr. Orpah Melter to assume care per family request. CSW Claiborne Billings aware of above.   Please leave chest port accessible for use at [facility].   RN please call report to [phone].  Please fax discharge summary to [phone].  Thank you,  Erling Conte, LCSW [phone]

## 2015-08-03 NOTE — Progress Notes (Signed)
PROGRESS NOTE  Ebony Herring [id number] DOB: 10-22-1931 DOA: 07/28/2015 PCP: [name], MD  Brief Narrative: 86yow PMH endometrial cancer s/p resection 2015 with chemoraditation ending 8/205 admitted for dehydration, dizziness, presyncope. Renal function briefly improved but then again worsened, developed aspiration pneumonia after becoming choked on candy and water. Swallowing evaluation revealed profound oropharyngeal dysphagia with no safe diet available. Palliative care was involved in multiple discussions with family at bedside. Treated with IV antibiotics and fluids with improvement in renal function and stabilization of respiratory status although remains with oxygen requirement. Patient has become gradually weaker and prognosis is poor. Discussed treatment options with family members including further imaging, investigation, feeding tube and aggressive care. Profound oropharyngeal dysphagia per speech therapy not felt to be likely to improve. We also discussed concern for recurrent malignancy, limitations of feeding tube, and inability to prevent aspiration even when nothing by mouth. Patient DO NOT RESUSCITATE/DO NOT INTUBATE. Family declined further imaging, investigation and feeding tube. Decision made to treat with antibiotics and IV fluids but given failure to improve, plan is to discontinue both 6/24 and transfer to residential hospice.  Assessment/Plan: 1. Acute hypoxic respiratory failure secondary to aspiration of food and liquids. Oxygen requirement remained stable. 2. Right lower lobe aspiration pneumonia secondary to food/fluid. WBC elevated but stable. Oxygen requirement stable. Currently on antibiotics. 3. Severe oral pharyngeal dysphasia, etiology unclear, however history suggestive of subacute process over the last few months. Insidious onset. No focal neurologic deficits and no history to suggest stroke, no history of dementia. Offered further investigation with imaging  and treatment which family declined. Family declines feeding tube. 4. Acute encephalopathy/delirium secondary to acute illness. 5. Acute kidney injury with metabolic acidosis. Gradually improving with IV fluids. 6. Generalized weakness, failure to thrive, weight loss. Multifactorial including dysphagia. Concern for recurrent malignancy. 7. Elevated TSH, not clearly symptomatic. Started on Levophed paroxetine but would not continue on discharge. 8. Elevated troponin, no evidence of ACS. Echocardiogram reassuring. Cardiology recommended no further evaluation. 9. Stable normocytic anemia, anemia of chronic disease. 10. Endometrial cancer status post chemotherapy and radiation therapy 2015. CT abdomen and pelvis 2015 with possible rectal lesion and sclerotic right iliac wing lesion. Renal ultrasound this admission suggested possible liver lesions. Clinical picture worrisome for recurrent malignancy. Discussed in detail with family who declined further imaging or investigation. Clearly not a candidate for any treatment.   Discussed above with son, husband at bedside and other family. We again reviewed clinical challenges and options available including further evaluation. Family declines feeding tube and further evaluation. Husband still struggling but agreeable to transfer to residential hospice 6/24. They understand antibiotics and fluids will be discontinued 6/24. They appear to be a piece with their decision.  DVT prophylaxis: not indicated Code Status: DNR/DNI Family Communication:  Disposition Plan: residential hospice 6/24  Murray Hodgkins, MD  [facility] Direct contact: [phone] --Via amion app OR  --[email or web address]; password [email or web address]  7PM-7AM contact night coverage as above 08/03/2015, 11:49 AM  LOS: 3 days   Consultants: 1. Cardiology 2. PMT 3. PT: HH  Procedures:  Echo Study Conclusions  - Left ventricle: The cavity size was normal. Systolic function was  normal. The  estimated ejection fraction was in the range of 60%  to 65%. Wall motion was normal; there were no regional wall  motion abnormalities. Doppler parameters are consistent with  abnormal left ventricular relaxation (grade 1 diastolic  dysfunction). Doppler parameters are consistent with elevated  ventricular end-diastolic filling pressure. - Ventricular septum: The  contour showed diastolic flattening  consistent with RV volume overload. - Aortic valve: There was mild regurgitation. - Mitral valve: Transvalvular velocity was within the normal range.  There was no evidence for stenosis. There was mild regurgitation  directed posteriorly. - Right ventricle: The cavity size was moderately dilated. Wall  thickness was normal. Systolic function was moderately reduced. - Right atrium: The atrium was severely dilated. - Atrial septum: No defect or patent foramen ovale was identified  by color flow Doppler. - Tricuspid valve: There was moderate regurgitation. - Pulmonary arteries: Systolic pressure was mildly increased. PA  peak pressure: 46 mm Hg (S). - Inferior vena cava: The vessel was dilated. The respirophasic  diameter changes were blunted (< 50%), consistent with elevated  central venous pressure.  Antimicrobials:  Unaysn 6/21 >> 6/23  HPI/Subjective: Was confused yesterday but more alert and lucid today. Sleeping a lot. Husband, son, other family at bedside.  Patient reports no shortness of breath, no pain, no nausea or vomiting.  Objective: Filed Vitals:   08/02/15 0448 08/02/15 1431 08/02/15 2141 08/03/15 0529  BP: 118/54 124/74 122/68 108/54  Pulse: 84 102 93 89  Temp: 97.7 F (36.5 C) 99.1 F (37.3 C) 99 F (37.2 C) 97.9 F (36.6 C)  TempSrc: Oral Oral Axillary Oral  Resp: 22 21 20 18   Height:      Weight:      SpO2: 92% 97% 100% 100%    Intake/Output Summary (Last 24 hours) at 08/03/15 1149 Last data filed at 08/03/15 0700  Gross per 24 hour    Intake 1472.5 ml  Output      0 ml  Net 1472.5 ml     Filed Weights   07/28/15 1959  Weight: 56.654 kg (124 lb 14.4 oz)    Exam:    Constitutional:  . Appears weak, however calm and comfortable. ENMT:  . grossly normal hearing  Respiratory:  . CTA bilaterally, no w/r/r.  . Respiratory effort normal. No retractions or accessory muscle use Cardiovascular:  . RRR, no m/r/g . No LE extremity edema   Musculoskeletal:  . RUE, LUE, RLE, LLE   o Globally weak, tone normal Psychiatric:  . Mental status o Mood, affect appropriate o Somewhat confused  I have personally reviewed following labs and imaging studies:  Multiple voids  Potassium 3.2, CO2 17, BUN 41, creatinine 1.39  WBC without significant change, 15.8  Hemoglobin stable 8.4  Scheduled Meds: . ampicillin-sulbactam (UNASYN) IV  3 g Intravenous Q12H  . potassium chloride  10 mEq Intravenous Q1 Hr x 4   Continuous Infusions: . sterile water 925 mL with sodium acetate 150 mEq infusion 75 mL/hr at 08/03/15 [id number]    Principal Problem:   Acute respiratory failure with hypoxia (HCC) Active Problems:   Peripheral neuropathy due to chemotherapy (HCC)   Dehydration   AKI (acute kidney injury) (HCC)   Elevated troponin   Dizziness   Elevated TSH   Aspiration into airway   Aspiration pneumonia ([place])   Dyspnea   Palliative care encounter   Goals of care, counseling/discussion   Encounter for hospice care discussion   LOS: 3 days   Time spent 35 minutes, Greater than 50% in counseling and coordination of care

## 2015-08-03 NOTE — Care Management Note (Signed)
Case Management Note  Patient Details  Name: BIRDINE HANDLEY MRN: [id number] Date of Birth: 1931-03-30  Subjective/Objective: Pt admitted with Acute respiratory failure                   Action/Plan:    Plan discharging to  [facility]  Expected Discharge Date:  07/30/15               Expected Discharge Plan:  [place]  In-House Referral:  NA  Discharge planning Services  CM Consult  Post Acute Care Choice:  Home Health Choice offered to:  Patient  DME Arranged:  [facility] DME Agency:     HH Arranged:    HH Agency:   [facility])  Status of Service:  Completed, signed off  If discussed at [employer], dates discussed:    Additional CommentsPurcell Mouton, RN 08/03/2015, 1:59 PM

## 2015-08-03 NOTE — Care Management Important Message (Signed)
Important Message  Patient Details  Name: Ebony Herring MRN: [id number] Date of Birth: [birth year]   Medicare Important Message Given:  Yes    Camillo Flaming 08/03/2015, 9:05 AM

## 2015-08-04 MED ORDER — HEPARIN SOD (PORK) LOCK FLUSH 100 UNIT/ML IV SOLN
500.0000 [IU] | INTRAVENOUS | Status: AC | PRN
  Administered 2015-08-04: 500 [IU]

## 2015-08-04 NOTE — Progress Notes (Signed)
Report called to [facility], Shelly Bombard [address] [facility] scheduled pick up will be  Between 1-2 pm.

## 2015-08-04 NOTE — Clinical Social Work Note (Signed)
Patient will discharge to [facility] today, transported by ambulance. Transport has been arranged as requested between 1-2 pm and discharge summary transmitted to hospice facility.  Nurse advised where to locate number to call report to [facility].  Kasin Tonkinson Givens, MSW, LCSW Licensed Clinical Social Worker [facility] [phone]

## 2015-08-11 DEATH — deceased

## 2015-08-20 ENCOUNTER — Ambulatory Visit: Payer: Self-pay | Admitting: Oncology

## 2016-01-10 ENCOUNTER — Ambulatory Visit: Payer: Self-pay | Admitting: Radiation Oncology

## 2016-10-08 IMAGING — CT CT HEAD W/O CM
3 of 4 series · 14 of 47 positions shown, 16 images · non-contrast
Comparison: None.

CLINICAL DATA: Hx of endometrial ca and is currently having
dizziness that brought her to the ED

EXAM:
CT HEAD WITHOUT CONTRAST
TECHNIQUE: Contiguous axial images were obtained from the base of the skull
through the vertex without intravenous contrast.

[Series 2: head w/o · axial · non-contrast · 0.45mm/px · z∈[+714,+839]mm · 8 of 31 slices shown, 10 images]
[im 3/31  brain]
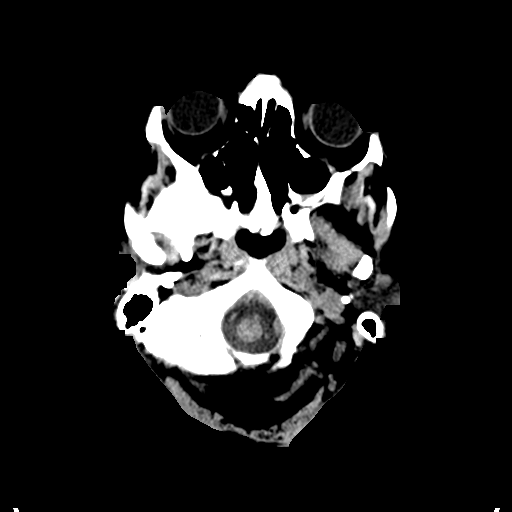
[im 3/31  bone]
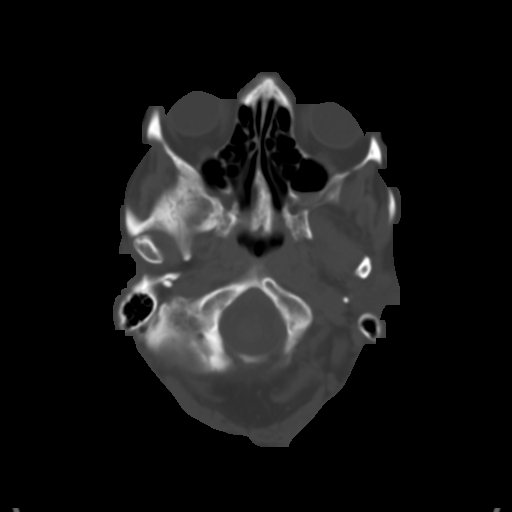
[im 7/31  brain]
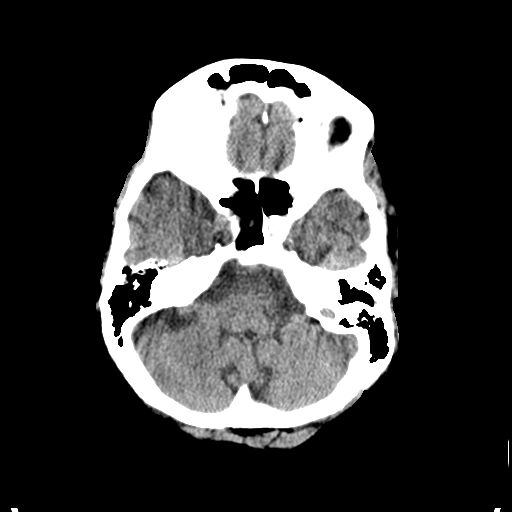
[im 11/31  brain]
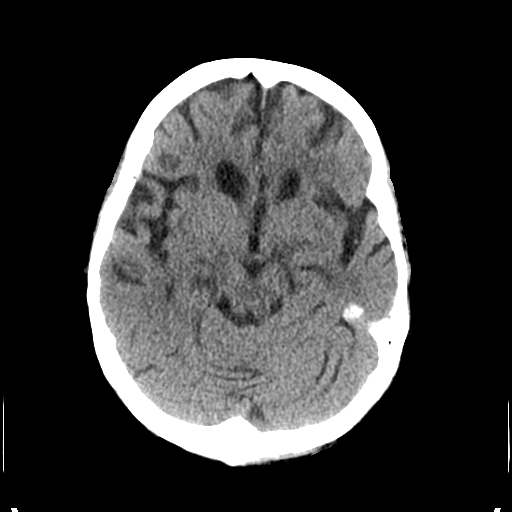
[im 13/31  brain]
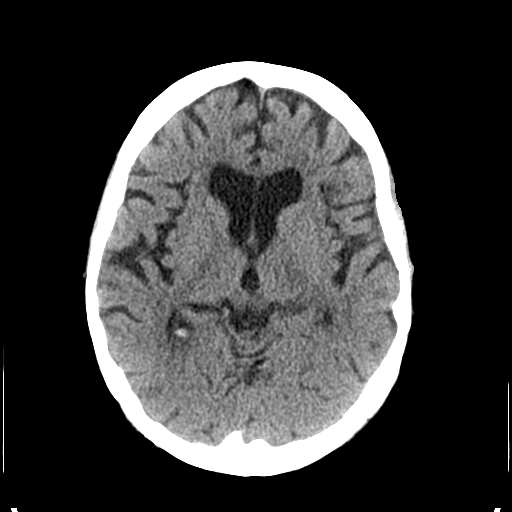
[im 18/31  brain]
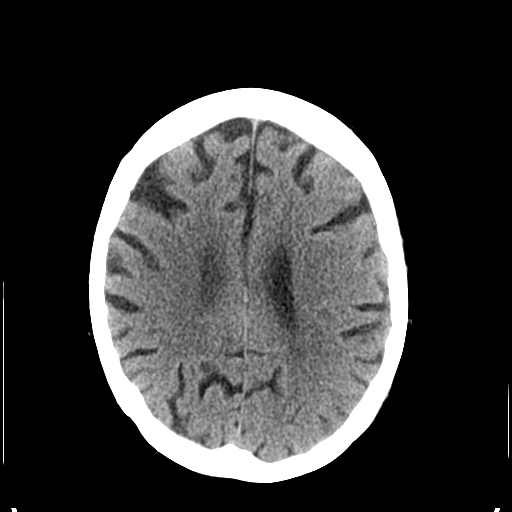
[im 18/31  bone]
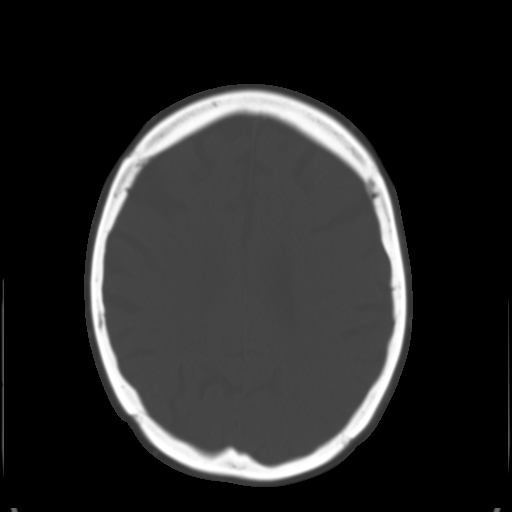
[im 20/31  brain]
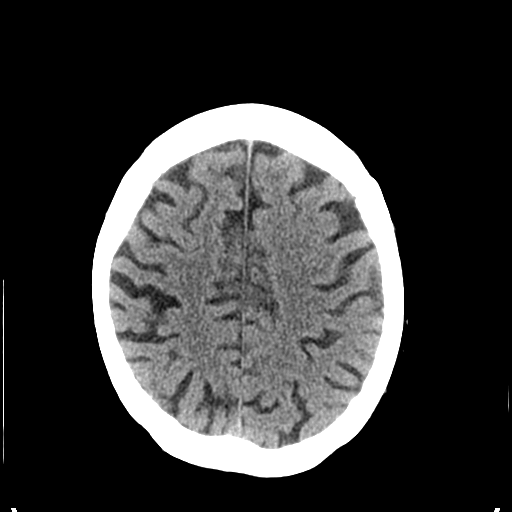
[im 24/31  brain]
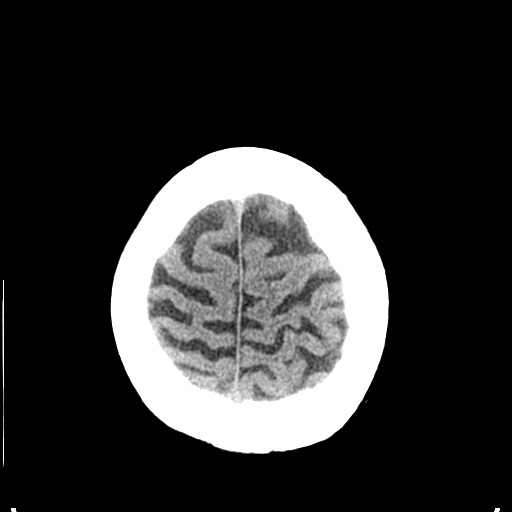
[im 28/31  brain]
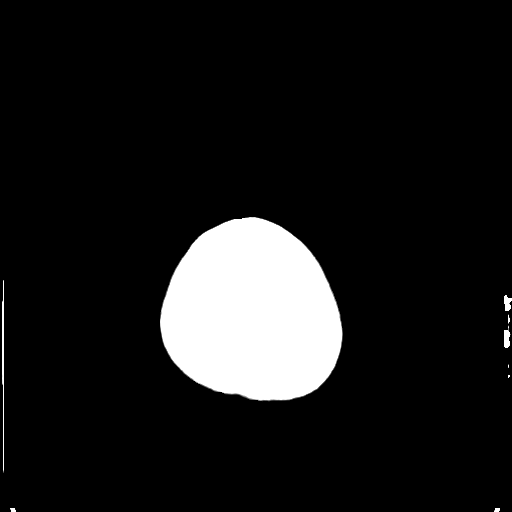

[Series 4: coronal · coronal · 0.32mm/px · 3 of 76 slices shown]
[im 26/76  brain]
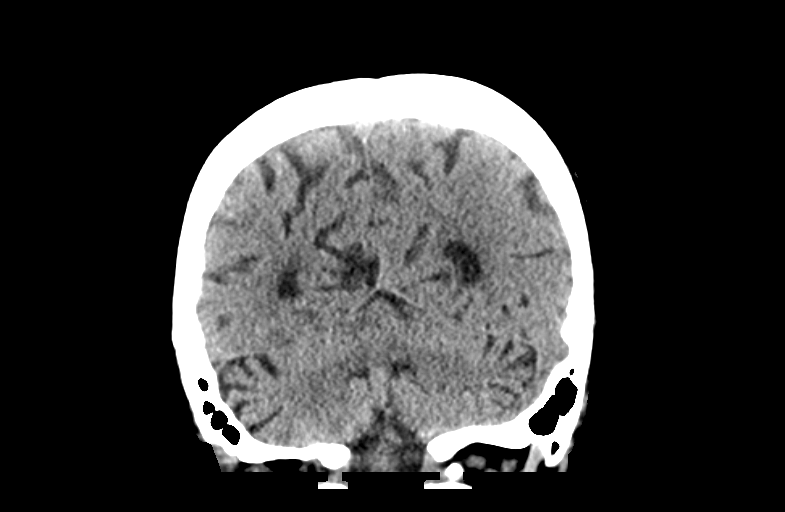
[im 34/76  brain]
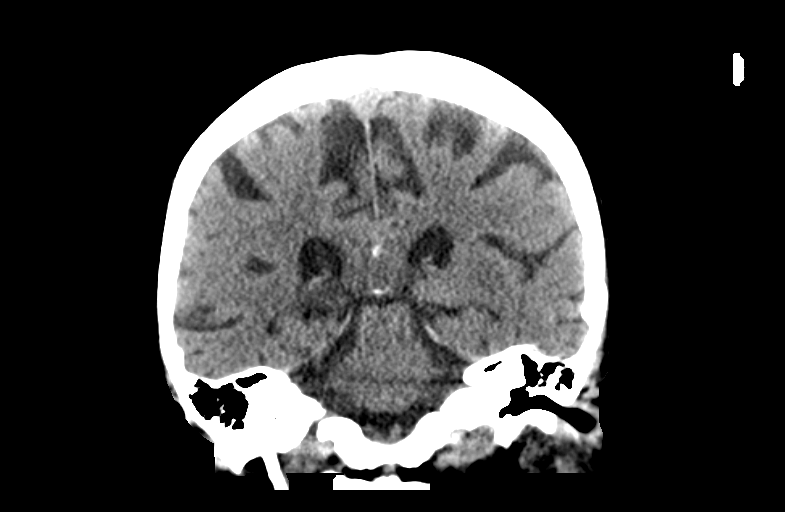
[im 42/76  brain]
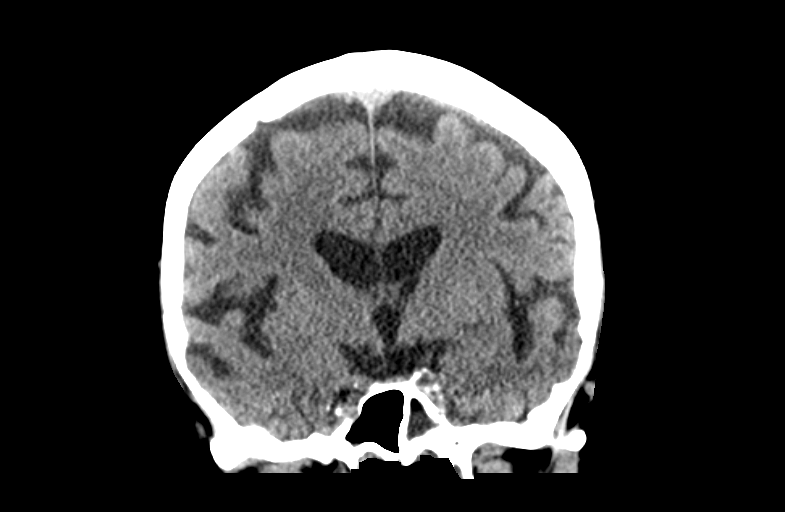

[Series 5: sagittal · sagittal · 0.29mm/px · 3 of 77 slices shown]
[im 26/77  brain]
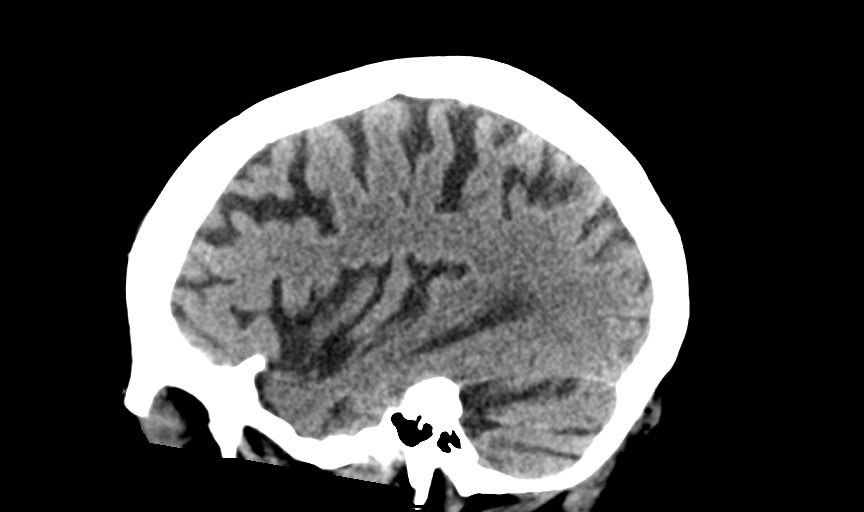
[im 39/77  brain]
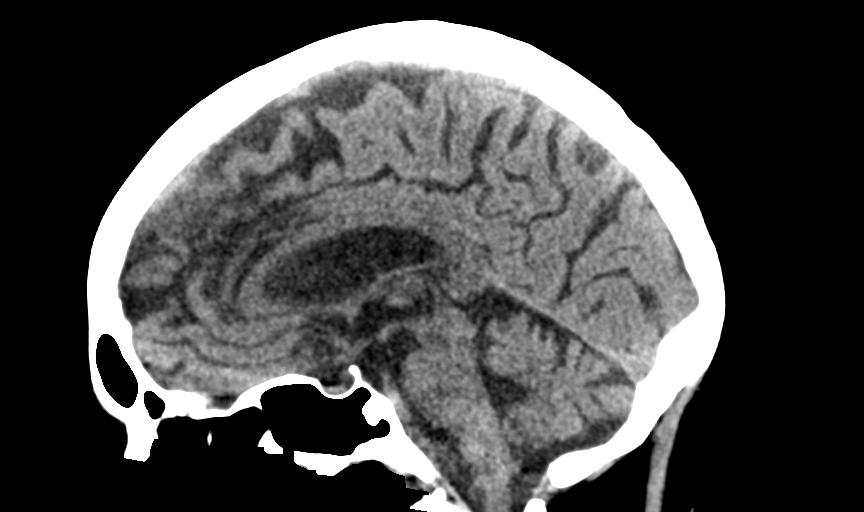
[im 51/77  brain]
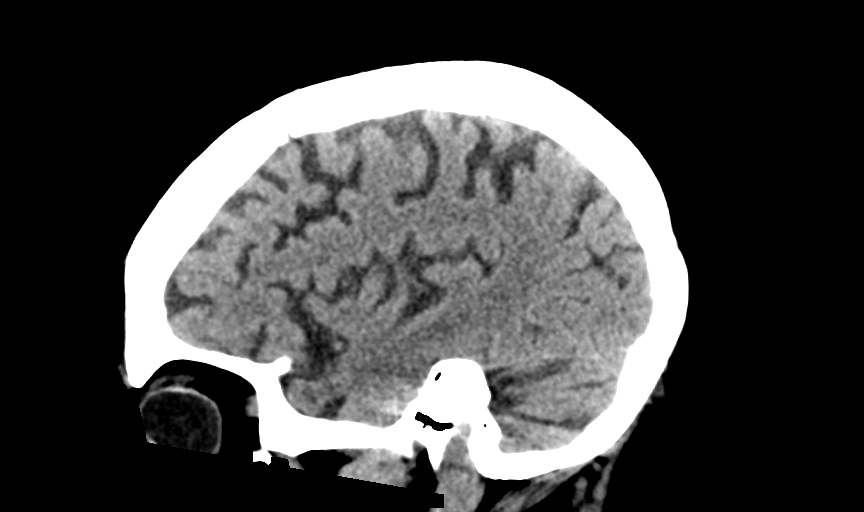

[14 of 47 positions shown; findings below may reference images not displayed]

FINDINGS: The ventricles are normal in configuration. There is ventricular and
sulcal enlargement reflecting mild atrophy. No hydrocephalus.

There are no parenchymal masses or mass effect. There is no evidence
of an infarct. Mild periventricular white matter hypoattenuation is
noted consistent with chronic microvascular ischemic change.

There are no extra-axial masses or abnormal fluid collections.

There is no intracranial hemorrhage.

There is dependent fluid in the left maxillary sinus. There is a
posterior mucous retention cysts left sphenoid sinus. Remaining
visualized sinuses are clear as are the mastoid air cells. No skull
fracture.
IMPRESSION: 1. No acute intracranial abnormalities.  No skull fracture.
2. Air-fluid level in the left maxillary sinus. This is nonspecific.
Consider acute sinusitis in the proper clinical setting.
